# Patient Record
Sex: Male | Born: 1950
Health system: Southern US, Community
[De-identification: ages and names within clinical notes are randomized; demographics above are authoritative.]

## PROBLEM LIST (undated history)

## (undated) DIAGNOSIS — K9189 Other postprocedural complications and disorders of digestive system: Secondary | ICD-10-CM

## (undated) DIAGNOSIS — E119 Type 2 diabetes mellitus without complications: Secondary | ICD-10-CM

## (undated) DIAGNOSIS — K838 Other specified diseases of biliary tract: Secondary | ICD-10-CM

## (undated) DIAGNOSIS — N200 Calculus of kidney: Secondary | ICD-10-CM

## (undated) DIAGNOSIS — K819 Cholecystitis, unspecified: Secondary | ICD-10-CM

## (undated) DIAGNOSIS — K8000 Calculus of gallbladder with acute cholecystitis without obstruction: Secondary | ICD-10-CM

## (undated) DIAGNOSIS — I10 Essential (primary) hypertension: Secondary | ICD-10-CM

## (undated) HISTORY — PX: WISDOM TOOTH EXTRACTION: SHX21

## (undated) HISTORY — DX: Essential (primary) hypertension: I10

## (undated) HISTORY — DX: Type 2 diabetes mellitus without complications: E11.9

---

## 2011-06-21 ENCOUNTER — Other Ambulatory Visit: Payer: Self-pay | Admitting: Family Medicine

## 2012-01-30 ENCOUNTER — Ambulatory Visit (INDEPENDENT_AMBULATORY_CARE_PROVIDER_SITE_OTHER): Payer: BC Managed Care – PPO | Admitting: Family Medicine

## 2012-01-30 DIAGNOSIS — E785 Hyperlipidemia, unspecified: Secondary | ICD-10-CM | POA: Insufficient documentation

## 2012-01-30 DIAGNOSIS — L989 Disorder of the skin and subcutaneous tissue, unspecified: Secondary | ICD-10-CM

## 2012-01-30 DIAGNOSIS — D649 Anemia, unspecified: Secondary | ICD-10-CM

## 2012-01-30 DIAGNOSIS — I10 Essential (primary) hypertension: Secondary | ICD-10-CM

## 2012-01-30 DIAGNOSIS — E119 Type 2 diabetes mellitus without complications: Secondary | ICD-10-CM | POA: Insufficient documentation

## 2012-01-30 LAB — LIPID PANEL
Cholesterol: 170 mg/dL (ref 0–200)
HDL: 44 mg/dL (ref >39)
LDL Cholesterol: 113 mg/dL — ABNORMAL HIGH (ref 0–99)
Total CHOL/HDL Ratio: 3.9 Ratio
Triglycerides: 67 mg/dL (ref <150)
VLDL: 13 mg/dL (ref 0–40)

## 2012-01-30 LAB — COMPREHENSIVE METABOLIC PANEL
ALT: 17 U/L (ref 0–53)
AST: 16 U/L (ref 0–37)
Albumin: 4.8 g/dL (ref 3.5–5.2)
Alkaline Phosphatase: 55 U/L (ref 39–117)
BUN: 14 mg/dL (ref 6–23)
CO2: 28 mEq/L (ref 19–32)
Calcium: 9.4 mg/dL (ref 8.4–10.5)
Chloride: 107 mEq/L (ref 96–112)
Creat: 0.88 mg/dL (ref 0.50–1.35)
Glucose, Bld: 146 mg/dL — ABNORMAL HIGH (ref 70–99)
Potassium: 4.4 mEq/L (ref 3.5–5.3)
Sodium: 140 mEq/L (ref 135–145)
Total Bilirubin: 0.7 mg/dL (ref 0.3–1.2)
Total Protein: 6.9 g/dL (ref 6.0–8.3)

## 2012-01-30 LAB — POCT CBC
Granulocyte percent: 71.2 %G (ref 37–80)
HCT, POC: 36.2 % — AB (ref 43.5–53.7)
Hemoglobin: 12.3 g/dL — AB (ref 14.1–18.1)
Lymph, poc: 1.1 (ref 0.6–3.4)
MCH, POC: 32.9 pg — AB (ref 27–31.2)
MCHC: 34 g/dL (ref 31.8–35.4)
MCV: 96.7 fL (ref 80–97)
MID (cbc): 0.4 (ref 0–0.9)
MPV: 8.1 fL (ref 0–99.8)
POC Granulocyte: 3.8 (ref 2–6.9)
POC LYMPH PERCENT: 20.5 %L (ref 10–50)
POC MID %: 8.3 %M (ref 0–12)
Platelet Count, POC: 216 10*3/uL (ref 142–424)
RBC: 3.74 M/uL — AB (ref 4.69–6.13)
RDW, POC: 12.7 %
WBC: 5.4 10*3/uL (ref 4.6–10.2)

## 2012-01-30 LAB — POCT GLYCOSYLATED HEMOGLOBIN (HGB A1C): Hemoglobin A1C: 5.6

## 2012-01-30 MED ORDER — LISINOPRIL 20 MG PO TABS: 20.0000 mg | ORAL_TABLET | Freq: Every day | ORAL | Status: DC

## 2012-01-30 MED ORDER — TERBINAFINE HCL 250 MG PO TABS: 250.0000 mg | ORAL_TABLET | Freq: Every day | ORAL | Status: AC

## 2012-01-30 MED ORDER — PRAVASTATIN SODIUM 40 MG PO TABS: 40.0000 mg | ORAL_TABLET | Freq: Every day | ORAL | Status: DC

## 2012-01-30 MED ORDER — METFORMIN HCL 500 MG PO TABS: 500.0000 mg | ORAL_TABLET | Freq: Two times a day (BID) | ORAL | Status: DC

## 2012-01-30 NOTE — Progress Notes (Signed)
Patient Name: Chad Cox Date of Birth: 1950-11-12 Medical Record Number: [id number] Gender: male Date of Encounter: 01/30/2012  History of Present Illness:  Chad Cox is a 61 y.o. very pleasant male patient who presents with the following:  Here today to recheck his DM and HTN.  Is not exercising much, but otherwise feels that he is doing ok.  He does have toenail fungus- mostly of his bilateral great toes which is bothersome.  He has tried OTC topicals which have not helped He also notes a "pain at the end of my tailbone" which is not worse with having a BM.  This has been present for about 3 weeks, not getting worse- seems to be better at this time. Also notes that he has become the POA for an ill relative which is stressful for him.  He is taking some benadryl to help him sleep at night  Patient Active Problem List  Diagnoses  . Diabetes mellitus  . Hypertension  . Dyslipidemia   No past medical history on file. No past surgical history on file. History  Substance Use Topics  . Smoking status: Never Smoker   . Smokeless tobacco: Not on file  . Alcohol Use: Not on file   No family history on file. Allergies  Allergen Reactions  . Adhesive (Tape)   . Betadine (Povidone Iodine)     Medication list has been reviewed and updated.  Review of Systems: As per HPI- otherwise negative.  Physical Examination: Filed Vitals:   01/30/12 0808  BP: 121/75  Pulse: 71  Temp: 98.7 F (37.1 C)  TempSrc: Oral  Resp: 16  Height: 5\' 11"  (1.803 m)  Weight: 223 lb (101.152 kg)    Body mass index is 31.10 kg/(m^2).  GEN: WDWN, NAD, Non-toxic, A & O x 3, overweight HEENT: Atraumatic, Normocephalic. Neck supple. No masses, No LAD. Ears and Nose: No external deformity. CV: RRR, No M/G/R. No JVD. No thrill. No extra heart sounds. PULM: CTA B, no wheezes, crackles, rhonchi. No retractions. No resp. distress. No accessory muscle use. ABD: S, NT, ND, +BS. No rebound. No  HSM. EXTR: No c/c/e NEURO Normal gait.  PSYCH: Normally interactive. Conversant. Not depressed or anxious appearing.  Calm demeanor.  Foot exam:  Thickened toenails, especially bilateral great toes but otherwise normal Unable to elicit pain by palpating tailbone- no masses or fluctuance noted He points out a dark lesion which has appeared on his right cheek in the last few months.    Results for orders placed in visit on 01/30/12  POCT CBC      Component Value Range   WBC 5.4  4.6 - 10.2 (K/uL)   Lymph, poc 1.1  0.6 - 3.4    POC LYMPH PERCENT 20.5  10 - 50 (%L)   MID (cbc) 0.4  0 - 0.9    POC MID % 8.3  0 - 12 (%M)   POC Granulocyte 3.8  2 - 6.9    Granulocyte percent 71.2  37 - 80 (%G)   RBC 3.74 (*) 4.69 - 6.13 (M/uL)   Hemoglobin 12.3 (*) 14.1 - 18.1 (g/dL)   HCT, POC 40.9 (*) 81.1 - 53.7 (%)   MCV 96.7  80 - 97 (fL)   MCH, POC 32.9 (*) 27 - 31.2 (pg)   MCHC 34.0  31.8 - 35.4 (g/dL)   RDW, POC 91.4     Platelet Count, POC 216  142 - 424 (K/uL)   MPV 8.1  0 - 99.8 (  fL)  POCT GLYCOSYLATED HEMOGLOBIN (HGB A1C)      Component Value Range   Hemoglobin A1C 5.6     Assessment and Plan: 1. Diabetes mellitus  Comprehensive metabolic panel, POCT glycosylated hemoglobin (Hb A1C), Microalbumin, urine, metFORMIN (GLUCOPHAGE) 500 MG tablet  2. Hypertension  POCT CBC, lisinopril (PRINIVIL,ZESTRIL) 20 MG tablet  3. Dyslipidemia  Lipid panel, pravastatin (PRAVACHOL) 40 MG tablet  4. Toenail fungus  terbinafine (LAMISIL) 250 MG tablet  5. Skin lesion  Ambulatory referral to Dermatology  6. Anemia  Ambulatory referral to Gastroenterology   Await labs for further follow-up.   DM and BP well- controlled.  Encouraged more exercise.   Mylz never did get a colonoscopy as was recommended to him and I am concerned about his anemia.  Will make a referral to GI for him.  Also refer to dermatology to evaluate lesion on his cheek.  Rx lamisil for 12 weeks today for toenail fungus- he will await LFT  results prior to filling rx.

## 2012-01-31 LAB — MICROALBUMIN, URINE: Microalb, Ur: 1.51 mg/dL (ref 0.00–1.89)

## 2012-05-05 ENCOUNTER — Other Ambulatory Visit: Payer: Self-pay | Admitting: Family Medicine

## 2012-07-08 ENCOUNTER — Telehealth: Payer: Self-pay

## 2012-07-08 DIAGNOSIS — I10 Essential (primary) hypertension: Secondary | ICD-10-CM

## 2012-07-08 NOTE — Telephone Encounter (Signed)
Pt needs renewal on Lisinopril 20 mg to [facility] [address] and [place]

## 2012-07-08 NOTE — Telephone Encounter (Signed)
Pt is needing to get a refill on blood pressure medication  Best number [phone] or cell [phone]  [facility] [address] and [address]

## 2012-07-09 MED ORDER — LISINOPRIL 20 MG PO TABS: 20.0000 mg | ORAL_TABLET | Freq: Every day | ORAL | Status: DC

## 2012-07-09 NOTE — Telephone Encounter (Signed)
Done. But pt needs an OV.

## 2012-07-09 NOTE — Telephone Encounter (Signed)
Patient notified

## 2012-08-07 ENCOUNTER — Ambulatory Visit (INDEPENDENT_AMBULATORY_CARE_PROVIDER_SITE_OTHER): Payer: BC Managed Care – PPO | Admitting: Family Medicine

## 2012-08-07 ENCOUNTER — Ambulatory Visit: Payer: BC Managed Care – PPO

## 2012-08-07 VITALS — BP 118/74 | HR 76 | Temp 98.3°F | Resp 16 | Ht 71.0 in | Wt 231.6 lb

## 2012-08-07 DIAGNOSIS — D649 Anemia, unspecified: Secondary | ICD-10-CM

## 2012-08-07 DIAGNOSIS — M533 Sacrococcygeal disorders, not elsewhere classified: Secondary | ICD-10-CM

## 2012-08-07 DIAGNOSIS — I10 Essential (primary) hypertension: Secondary | ICD-10-CM

## 2012-08-07 DIAGNOSIS — E279 Disorder of adrenal gland, unspecified: Secondary | ICD-10-CM

## 2012-08-07 DIAGNOSIS — E119 Type 2 diabetes mellitus without complications: Secondary | ICD-10-CM

## 2012-08-07 LAB — POCT GLYCOSYLATED HEMOGLOBIN (HGB A1C): Hemoglobin A1C: 5

## 2012-08-07 LAB — POCT CBC
Granulocyte percent: 74.2 %G (ref 37–80)
HCT, POC: 39.3 % — AB (ref 43.5–53.7)
Hemoglobin: 12.8 g/dL — AB (ref 14.1–18.1)
Lymph, poc: 1 (ref 0.6–3.4)
MCH, POC: 33.3 pg — AB (ref 27–31.2)
MCHC: 32.6 g/dL (ref 31.8–35.4)
MCV: 102.4 fL — AB (ref 80–97)
MID (cbc): 0.4 (ref 0–0.9)
MPV: 8.6 fL (ref 0–99.8)
POC Granulocyte: 3.9 (ref 2–6.9)
POC LYMPH PERCENT: 18.1 %L (ref 10–50)
POC MID %: 7.7 %M (ref 0–12)
Platelet Count, POC: 210 10*3/uL (ref 142–424)
RBC: 3.84 M/uL — AB (ref 4.69–6.13)
RDW, POC: 12.5 %
WBC: 5.3 10*3/uL (ref 4.6–10.2)

## 2012-08-07 MED ORDER — LISINOPRIL 20 MG PO TABS: 20.0000 mg | ORAL_TABLET | Freq: Every day | ORAL | Status: DC

## 2012-08-07 NOTE — Patient Instructions (Addendum)
Your A1c looks great today, as does your blood pressure  Think about getting your flu shot!  This also helps to protect others around you who may be weaker than yourself.    Your mild anemia is stable.  However, you may be low on B12 or folate.  Add a multivitamin to your regimen.  Take care and let's check back in the spring

## 2012-08-07 NOTE — Progress Notes (Signed)
[facility] [address], [place] [address] [phone]  Date:  08/07/2012   Name:  Chad Cox   DOB:  July 23, 1951   MRN:  [id number]  PCP:  Abbe Amsterdam, MD    Chief Complaint: Medication Refill   History of Present Illness:  Chad Cox is a 61 y.o. very pleasant male patient who presents with the following:  Here today for a recheck. He feels that he is doing ok.  His toenails never did clear up from the fungus, but he is not bothered except by their appearance.   He did get a colonoscopy- he was told that everything looked fine.  He was told to follow-up in 10 years.   Discussed flu shot- he declines to have this done this year.   He does need refills of some of his medications.  He also continues to have pain in his tailbone, mostly just when he sits on a hard surface.  He does not have pain all of the time.  His has not noted any changes in his bowel habits.    Patient Active Problem List  Diagnosis  . Diabetes mellitus  . Hypertension  . Dyslipidemia    No past medical history on file.  No past surgical history on file.  History  Substance Use Topics  . Smoking status: Never Smoker   . Smokeless tobacco: Not on file  . Alcohol Use: Not on file    No family history on file.  Allergies  Allergen Reactions  . Adhesive (Tape)   . Betadine (Povidone Iodine)     Medication list has been reviewed and updated.  Current Outpatient Prescriptions on File Prior to Visit  Medication Sig Dispense Refill  . aspirin 81 MG tablet Take 81 mg by mouth daily.      Marland Kitchen lisinopril (PRINIVIL,ZESTRIL) 20 MG tablet Take 1 tablet (20 mg total) by mouth daily.  30 tablet  0  . metFORMIN (GLUCOPHAGE) 500 MG tablet Take 1 tablet (500 mg total) by mouth 2 (two) times daily with a meal.  90 tablet  3  . pravastatin (PRAVACHOL) 40 MG tablet Take 1 tablet (40 mg total) by mouth daily.  90 tablet  3  . terbinafine (LAMISIL) 250 MG tablet Take 1 tablet  (250 mg total) by mouth daily. Can rx #30 with 2 refills if required  120 tablet  0    Review of Systems:  As per HPI- otherwise negative.   Physical Examination: Filed Vitals:   08/07/12 0803  BP: 118/74  Pulse: 76  Temp: 98.3 F (36.8 C)  Resp: 16   Filed Vitals:   08/07/12 0803  Height: 5\' 11"  (1.803 m)  Weight: 231 lb 9.6 oz (105.053 kg)   Body mass index is 32.30 kg/(m^2). Ideal Body Weight: Weight in (lb) to have BMI = 25: 178.9   GEN: WDWN, NAD, Non-toxic, A & O x 3 HEENT: Atraumatic, Normocephalic. Neck supple. No masses, No LAD. Ears and Nose: No external deformity. CV: RRR, No M/G/R. No JVD. No thrill. No extra heart sounds. PULM: CTA B, no wheezes, crackles, rhonchi. No retractions. No resp. distress. No accessory muscle use. ABD: S, NT, ND, +BS. No rebound. No HSM. EXTR: No c/c/e NEURO Normal gait.  PSYCH: Normally interactive. Conversant. Not depressed or anxious appearing.  Calm demeanor.  GU: normal rectal exam, prostate normal and non- enlarged, non tender.  Coccyx is not tender to my exam Results for orders placed in visit on  08/07/12  POCT CBC      Component Value Range   WBC 5.3  4.6 - 10.2 K/uL   Lymph, poc 1.0  0.6 - 3.4   POC LYMPH PERCENT 18.1  10 - 50 %L   MID (cbc) 0.4  0 - 0.9   POC MID % 7.7  0 - 12 %M   POC Granulocyte 3.9  2 - 6.9   Granulocyte percent 74.2  37 - 80 %G   RBC 3.84 (*) 4.69 - 6.13 M/uL   Hemoglobin 12.8 (*) 14.1 - 18.1 g/dL   HCT, POC 16.1 (*) 09.6 - 53.7 %   MCV 102.4 (*) 80 - 97 fL   MCH, POC 33.3 (*) 27 - 31.2 pg   MCHC 32.6  31.8 - 35.4 g/dL   RDW, POC 04.5     Platelet Count, POC 210  142 - 424 K/uL   MPV 8.6  0 - 99.8 fL  POCT GLYCOSYLATED HEMOGLOBIN (HGB A1C)      Component Value Range   Hemoglobin A1C 5.0     [facility] reading (PRIMARY) by  Dr. Patsy Lager.  Sacrum/ coccyx: normal SACRUM AND COCCYX - 2+ VIEW  Comparison: None  Findings: Degenerative disc disease noted within the L5 S1 level.  No fracture or  subluxation identified.  No radio-opaque foreign bodies or soft tissue calcifications.  IMPRESSION:  1. No acute findings. 2. L5-S1 DJD.  Assessment and Plan: 1. Diabetes mellitus type II  POCT glycosylated hemoglobin (Hb A1C)  2. HTN (hypertension)    3. Coccyx pain  DG Sacrum/Coccyx  4. Anemia  POCT CBC  5. Hypertension  lisinopril (PRINIVIL,ZESTRIL) 20 MG tablet   Chad Cox's DM is very well controlled.  He is actually just taking his metformin once a day and we can continue this.  Refilled his lisinopril.  Reviewed his labs from March- it seems he never heard back from [place] regarding these labs.  Asked him to be sure to let me know if he does not get his labs in the future, because I always want him to have his lab information.  Gave some information about coxodynia, and he will think about if he wants to have any further therapy such as injections.    HTN is controlled.  Anemia is stable- we know he does not have colon cancer, so this is less concerning.    Abbe Amsterdam, MD

## 2012-08-10 ENCOUNTER — Encounter: Payer: Self-pay | Admitting: Family Medicine

## 2012-08-12 ENCOUNTER — Other Ambulatory Visit: Payer: Self-pay | Admitting: Family Medicine

## 2012-08-12 ENCOUNTER — Encounter: Payer: Self-pay | Admitting: Family Medicine

## 2012-08-12 DIAGNOSIS — Z125 Encounter for screening for malignant neoplasm of prostate: Secondary | ICD-10-CM

## 2012-08-14 ENCOUNTER — Encounter: Payer: Self-pay | Admitting: Family Medicine

## 2012-12-23 ENCOUNTER — Other Ambulatory Visit: Payer: Self-pay

## 2013-02-06 ENCOUNTER — Other Ambulatory Visit: Payer: Self-pay | Admitting: Family Medicine

## 2013-04-19 ENCOUNTER — Telehealth: Payer: Self-pay

## 2013-04-19 MED ORDER — METFORMIN HCL 500 MG PO TABS: 500.0000 mg | ORAL_TABLET | Freq: Two times a day (BID) | ORAL | Status: DC

## 2013-04-19 NOTE — Telephone Encounter (Signed)
lmom advising pt that he needs office visit.  Only 30 sent in.

## 2013-04-19 NOTE — Telephone Encounter (Signed)
Pt would like to have a refill on metformin. Best# [phone]  Pharmacy:[facility] [address] and [address]

## 2013-04-21 ENCOUNTER — Other Ambulatory Visit: Payer: Self-pay | Admitting: Family Medicine

## 2013-04-22 ENCOUNTER — Other Ambulatory Visit: Payer: Self-pay | Admitting: Family Medicine

## 2013-05-06 ENCOUNTER — Ambulatory Visit (INDEPENDENT_AMBULATORY_CARE_PROVIDER_SITE_OTHER): Payer: BC Managed Care – PPO | Admitting: Family Medicine

## 2013-05-06 VITALS — BP 128/72 | HR 70 | Temp 98.1°F | Resp 16 | Ht 71.0 in | Wt 231.0 lb

## 2013-05-06 DIAGNOSIS — Z125 Encounter for screening for malignant neoplasm of prostate: Secondary | ICD-10-CM

## 2013-05-06 DIAGNOSIS — E119 Type 2 diabetes mellitus without complications: Secondary | ICD-10-CM

## 2013-05-06 DIAGNOSIS — E785 Hyperlipidemia, unspecified: Secondary | ICD-10-CM

## 2013-05-06 DIAGNOSIS — I10 Essential (primary) hypertension: Secondary | ICD-10-CM

## 2013-05-06 MED ORDER — METFORMIN HCL 500 MG PO TABS: 500.0000 mg | ORAL_TABLET | Freq: Every day | ORAL | Status: DC

## 2013-05-06 MED ORDER — LISINOPRIL 20 MG PO TABS: 20.0000 mg | ORAL_TABLET | Freq: Every day | ORAL | Status: DC

## 2013-05-06 MED ORDER — PRAVASTATIN SODIUM 40 MG PO TABS: 40.0000 mg | ORAL_TABLET | Freq: Every day | ORAL | Status: DC

## 2013-05-06 NOTE — Patient Instructions (Signed)
Fasting labs this week, then follow up to be determined. You should receive a call or letter about your lab results within the next week to 10 days.  Continue same doses of medicines for now.

## 2013-05-06 NOTE — Progress Notes (Signed)
Subjective:    Patient ID: Chad Cox, male    DOB: November 03, 1951, 62 y.o.   MRN: [id number]  HPI Chad Cox is a 62 y.o. male  Here for medication refill. Last ov 08/07/12.  Usual pt of Dr. Patsy Lager.  Has had family stressors - uncle for whom he was power of attorney passed away in 10-Jan-2023, and mom had hospitalization with subdural bleed, then heart attack then in rehab, then CHF.  Difficult year - has had trouble with getting into office.    HTN - lisinopril 20mg  qd. No cough, no new side of effects of meds. No missed doses of meds recently.    DM2 - on metformin 500mg  qd. Rare missed doses.  No home blood sugars.   Hyperlipidemia - LDL 113 in March 2013.  Still taking pravachol QD. Not fasting today, but plans to come in sometime early this week for fasting labs.    Had order in for PSA but has not been able to have this done.  For prostate cancer screening.  No fh of prostate CA. No new urinary sx's.    Results for orders placed in visit on 08/07/12  POCT CBC      Result Value Range   WBC 5.3  4.6 - 10.2 K/uL   Lymph, poc 1.0  0.6 - 3.4   POC LYMPH PERCENT 18.1  10 - 50 %L   MID (cbc) 0.4  0 - 0.9   POC MID % 7.7  0 - 12 %M   POC Granulocyte 3.9  2 - 6.9   Granulocyte percent 74.2  37 - 80 %G   RBC 3.84 (*) 4.69 - 6.13 M/uL   Hemoglobin 12.8 (*) 14.1 - 18.1 g/dL   HCT, POC 62.9 (*) 52.8 - 53.7 %   MCV 102.4 (*) 80 - 97 fL   MCH, POC 33.3 (*) 27 - 31.2 pg   MCHC 32.6  31.8 - 35.4 g/dL   RDW, POC 41.3     Platelet Count, POC 210  142 - 424 K/uL   MPV 8.6  0 - 99.8 fL  POCT GLYCOSYLATED HEMOGLOBIN (HGB A1C)      Result Value Range   Hemoglobin A1C 5.0      Review of Systems  Constitutional: Negative for fatigue and unexpected weight change.  Eyes: Negative for visual disturbance.  Respiratory: Negative for cough, chest tightness and shortness of breath.   Cardiovascular: Negative for chest pain, palpitations and leg swelling.  Gastrointestinal: Negative for  abdominal pain and blood in stool.  Neurological: Negative for dizziness, light-headedness and headaches.       Objective:   Physical Exam  Vitals reviewed. Constitutional: He is oriented to person, place, and time. He appears well-developed and well-nourished.  HENT:  Head: Normocephalic and atraumatic.  Eyes: Pupils are equal, round, and reactive to light.  Cardiovascular: Normal rate, regular rhythm, normal heart sounds and intact distal pulses.   Pulmonary/Chest: Effort normal and breath sounds normal.  Abdominal: Soft. There is no tenderness.  Neurological: He is alert and oriented to person, place, and time.  Microfilament testing of feet normal bilaterally.  Skin: Skin is warm, dry and intact. No rash noted.  Psychiatric: He has a normal mood and affect. His behavior is normal.       Assessment & Plan:  Chad Cox is a 62 y.o. male Diabetes - Plan: pravastatin (PRAVACHOL) 40 MG tablet, metFORMIN (GLUCOPHAGE) 500 MG tablet, Hemoglobin A1c - prior controlled.  Possible diet control,  but continue metformin 500mg  qd for now - labs this week.   Hypertension - Plan: lisinopril (PRINIVIL,ZESTRIL) 20 MG tablet, Comprehensive metabolic panel, Lipid panel.  - stable. meds refilled. Fasting labs at lab visit only this week.  Prostate cancer screening - PSA pending on labs this week.   Other and unspecified hyperlipidemia - Plan: pravastatin (PRAVACHOL) 40 MG tablet refilled. , Lipid panel pending.   Meds ordered this encounter  Medications  . pravastatin (PRAVACHOL) 40 MG tablet    Sig: Take 1 tablet (40 mg total) by mouth daily. Needs office visit    Dispense:  90 tablet    Refill:  1  . metFORMIN (GLUCOPHAGE) 500 MG tablet    Sig: Take 1 tablet (500 mg total) by mouth daily with breakfast. Needs office visit    Dispense:  90 tablet    Refill:  1  . lisinopril (PRINIVIL,ZESTRIL) 20 MG tablet    Sig: Take 1 tablet (20 mg total) by mouth daily.    Dispense:  90 tablet     Refill:  1   Patient Instructions  Fasting labs this week, then follow up to be determined. You should receive a call or letter about your lab results within the next week to 10 days.  Continue same doses of medicines for now.

## 2013-05-08 ENCOUNTER — Other Ambulatory Visit (INDEPENDENT_AMBULATORY_CARE_PROVIDER_SITE_OTHER): Payer: BC Managed Care – PPO | Admitting: *Deleted

## 2013-05-08 DIAGNOSIS — Z125 Encounter for screening for malignant neoplasm of prostate: Secondary | ICD-10-CM

## 2013-05-08 DIAGNOSIS — E785 Hyperlipidemia, unspecified: Secondary | ICD-10-CM

## 2013-05-08 DIAGNOSIS — E119 Type 2 diabetes mellitus without complications: Secondary | ICD-10-CM

## 2013-05-08 DIAGNOSIS — I10 Essential (primary) hypertension: Secondary | ICD-10-CM

## 2013-05-08 LAB — LIPID PANEL
Cholesterol: 140 mg/dL (ref 0–200)
HDL: 38 mg/dL — ABNORMAL LOW (ref >39)
LDL Cholesterol: 86 mg/dL (ref 0–99)
Total CHOL/HDL Ratio: 3.7 Ratio
Triglycerides: 78 mg/dL (ref <150)
VLDL: 16 mg/dL (ref 0–40)

## 2013-05-08 LAB — COMPREHENSIVE METABOLIC PANEL
ALT: 19 U/L (ref 0–53)
AST: 23 U/L (ref 0–37)
Albumin: 4.6 g/dL (ref 3.5–5.2)
Alkaline Phosphatase: 56 U/L (ref 39–117)
BUN: 14 mg/dL (ref 6–23)
CO2: 26 mEq/L (ref 19–32)
Calcium: 9 mg/dL (ref 8.4–10.5)
Chloride: 106 mEq/L (ref 96–112)
Creat: 0.92 mg/dL (ref 0.50–1.35)
Glucose, Bld: 138 mg/dL — ABNORMAL HIGH (ref 70–99)
Potassium: 4.2 mEq/L (ref 3.5–5.3)
Sodium: 139 mEq/L (ref 135–145)
Total Bilirubin: 0.6 mg/dL (ref 0.3–1.2)
Total Protein: 6.7 g/dL (ref 6.0–8.3)

## 2013-05-08 LAB — PSA: PSA: 1.44 ng/mL (ref <=4.00)

## 2013-05-08 LAB — HEMOGLOBIN A1C
Hgb A1c MFr Bld: 5.5 % (ref <5.7)
Mean Plasma Glucose: 111 mg/dL (ref <117)

## 2013-05-09 ENCOUNTER — Encounter: Payer: Self-pay | Admitting: Family Medicine

## 2013-05-20 ENCOUNTER — Other Ambulatory Visit: Payer: Self-pay | Admitting: Physician Assistant

## 2013-05-21 ENCOUNTER — Encounter: Payer: Self-pay | Admitting: Family Medicine

## 2013-08-03 ENCOUNTER — Ambulatory Visit (INDEPENDENT_AMBULATORY_CARE_PROVIDER_SITE_OTHER): Payer: BC Managed Care – PPO | Admitting: Emergency Medicine

## 2013-08-03 VITALS — BP 112/70 | HR 80 | Temp 98.6°F | Resp 18 | Wt 235.0 lb

## 2013-08-03 DIAGNOSIS — E119 Type 2 diabetes mellitus without complications: Secondary | ICD-10-CM

## 2013-08-03 DIAGNOSIS — E860 Dehydration: Secondary | ICD-10-CM

## 2013-08-03 DIAGNOSIS — R112 Nausea with vomiting, unspecified: Secondary | ICD-10-CM

## 2013-08-03 LAB — POCT CBC
Granulocyte percent: 86.9 %G — AB (ref 37–80)
HCT, POC: 41.7 % — AB (ref 43.5–53.7)
Hemoglobin: 13.6 g/dL — AB (ref 14.1–18.1)
Lymph, poc: 0.5 — AB (ref 0.6–3.4)
MCH, POC: 33.5 pg — AB (ref 27–31.2)
MCHC: 32.6 g/dL (ref 31.8–35.4)
MCV: 102.7 fL — AB (ref 80–97)
MID (cbc): 0.3 (ref 0–0.9)
MPV: 8.5 fL (ref 0–99.8)
POC Granulocyte: 5.9 (ref 2–6.9)
POC LYMPH PERCENT: 8 %L — AB (ref 10–50)
POC MID %: 5.1 %M (ref 0–12)
Platelet Count, POC: 185 10*3/uL (ref 142–424)
RBC: 4.06 M/uL — AB (ref 4.69–6.13)
RDW, POC: 13.5 %
WBC: 6.8 10*3/uL (ref 4.6–10.2)

## 2013-08-03 LAB — GLUCOSE, POCT (MANUAL RESULT ENTRY): POC Glucose: 169 mg/dl — AB (ref 70–99)

## 2013-08-03 LAB — COMPREHENSIVE METABOLIC PANEL
ALT: 17 U/L (ref 0–53)
AST: 18 U/L (ref 0–37)
Albumin: 4.3 g/dL (ref 3.5–5.2)
Alkaline Phosphatase: 52 U/L (ref 39–117)
BUN: 16 mg/dL (ref 6–23)
CO2: 25 mEq/L (ref 19–32)
Calcium: 9.4 mg/dL (ref 8.4–10.5)
Chloride: 104 mEq/L (ref 96–112)
Creat: 0.89 mg/dL (ref 0.50–1.35)
Glucose, Bld: 172 mg/dL — ABNORMAL HIGH (ref 70–99)
Potassium: 3.8 mEq/L (ref 3.5–5.3)
Sodium: 138 mEq/L (ref 135–145)
Total Bilirubin: 0.9 mg/dL (ref 0.3–1.2)
Total Protein: 6.7 g/dL (ref 6.0–8.3)

## 2013-08-03 LAB — POCT URINALYSIS DIPSTICK
Glucose, UA: NEGATIVE
Ketones, UA: 15
Leukocytes, UA: NEGATIVE
Nitrite, UA: NEGATIVE
Protein, UA: 100
Spec Grav, UA: >=1.03
Urobilinogen, UA: 1
pH, UA: 5.5

## 2013-08-03 MED ORDER — ONDANSETRON 4 MG PO TBDP
8.0000 mg | ORAL_TABLET | Freq: Once | ORAL | Status: AC
  Administered 2013-08-03: 8 mg via ORAL

## 2013-08-03 MED ORDER — ONDANSETRON 8 MG PO TBDP: 8.0000 mg | ORAL_TABLET | Freq: Three times a day (TID) | ORAL | Status: DC | PRN

## 2013-08-03 NOTE — Progress Notes (Signed)
[facility] [address], [place] [address] [phone]  Date:  08/03/2013   Name:  Chad Cox   DOB:  October 09, 1951   MRN:  [id number]  PCP:  Abbe Amsterdam, MD    Chief Complaint: Emesis, Back Pain and Headache   History of Present Illness:  Chad Cox is a 62 y.o. very pleasant male patient who presents with the following:  History of NIDDM on metformin.  Says that over past three days has experienced nausea, vomiting, and diarrhea.  Unable to hold down liquids and only today managed some oatmeal.  No measured fever but had intermittent chills.  The patient has no complaint of blood, mucous, or pus in her stools.  No abdominal pain. Thirsty  Now has an occipital headache with no neuro or visual symptoms.  No improvement with over the counter medications or other home remedies. Denies other complaint or health concern today.   Patient Active Problem List   Diagnosis Date Noted  . Diabetes mellitus 01/30/2012  . Hypertension 01/30/2012  . Dyslipidemia 01/30/2012    Past Medical History  Diagnosis Date  . Diabetes mellitus without complication   . Hypertension     No past surgical history on file.  History  Substance Use Topics  . Smoking status: Never Smoker   . Smokeless tobacco: Not on file  . Alcohol Use: No    No family history on file.  Allergies  Allergen Reactions  . Adhesive [Tape]   . Betadine [Povidone Iodine]     Medication list has been reviewed and updated.  Current Outpatient Prescriptions on File Prior to Visit  Medication Sig Dispense Refill  . aspirin 81 MG tablet Take 81 mg by mouth daily.      Marland Kitchen lisinopril (PRINIVIL,ZESTRIL) 20 MG tablet Take 1 tablet (20 mg total) by mouth daily.  90 tablet  1  . metFORMIN (GLUCOPHAGE) 500 MG tablet Take 1 tablet (500 mg total) by mouth daily with breakfast. Needs office visit  90 tablet  1  . pravastatin (PRAVACHOL) 40 MG tablet Take 1 tablet (40 mg total) by mouth daily.  Needs office visit  90 tablet  1   No current facility-administered medications on file prior to visit.    Review of Systems:  As per HPI, otherwise negative.    Physical Examination: Filed Vitals:   08/03/13 1016  BP: 112/70  Pulse: 80  Temp: 98.6 F (37 C)  Resp: 18   Filed Vitals:   08/03/13 1016  Weight: 235 lb (106.595 kg)   Body mass index is 32.79 kg/(m^2). Ideal Body Weight:    GEN: WDWN, NAD, Non-toxic, A & O x 3 HEENT: Atraumatic, Normocephalic. Neck supple. No masses, No LAD.  dry Ears and Nose: No external deformity. CV: RRR, No M/G/R. No JVD. No thrill. No extra heart sounds. PULM: CTA B, no wheezes, crackles, rhonchi. No retractions. No resp. distress. No accessory muscle use. ABD: S, NT, ND, +BS. No rebound. No HSM. EXTR: No c/c/e NEURO Normal gait.  PSYCH: Normally interactive. Conversant. Not depressed or anxious appearing.  Calm demeanor.    Assessment and Plan: Dehydration Gastroenteritis Increase fluids zofran  Signed,  Phillips Odor, MD   Results for orders placed in visit on 08/03/13  POCT CBC      Result Value Range   WBC 6.8  4.6 - 10.2 K/uL   Lymph, poc 0.5 (*) 0.6 - 3.4   POC LYMPH PERCENT 8.0 (*) 10 - 50 %L  MID (cbc) 0.3  0 - 0.9   POC MID % 5.1  0 - 12 %M   POC Granulocyte 5.9  2 - 6.9   Granulocyte percent 86.9 (*) 37 - 80 %G   RBC 4.06 (*) 4.69 - 6.13 M/uL   Hemoglobin 13.6 (*) 14.1 - 18.1 g/dL   HCT, POC 16.1 (*) 09.6 - 53.7 %   MCV 102.7 (*) 80 - 97 fL   MCH, POC 33.5 (*) 27 - 31.2 pg   MCHC 32.6  31.8 - 35.4 g/dL   RDW, POC 04.5     Platelet Count, POC 185  142 - 424 K/uL   MPV 8.5  0 - 99.8 fL  GLUCOSE, POCT (MANUAL RESULT ENTRY)      Result Value Range   POC Glucose 169 (*) 70 - 99 mg/dl  POCT URINALYSIS DIPSTICK      Result Value Range   Color, UA dk yellow     Clarity, UA clear     Glucose, UA neg     Bilirubin, UA small     Ketones, UA 15     Spec Grav, UA >=1.030     Blood, UA trace     pH, UA  5.5     Protein, UA 100     Urobilinogen, UA 1.0     Nitrite, UA neg     Leukocytes, UA Negative

## 2013-08-03 NOTE — Patient Instructions (Addendum)
Viral Gastroenteritis Viral gastroenteritis is also known as stomach flu. This condition affects the stomach and intestinal tract. It can cause sudden diarrhea and vomiting. The illness typically lasts 3 to 8 days. Most people develop an immune response that eventually gets rid of the virus. While this natural response develops, the virus can make you quite ill. CAUSES  Many different viruses can cause gastroenteritis, such as rotavirus or noroviruses. You can catch one of these viruses by consuming contaminated food or water. You may also catch a virus by sharing utensils or other personal items with an infected person or by touching a contaminated surface. SYMPTOMS  The most common symptoms are diarrhea and vomiting. These problems can cause a severe loss of body fluids (dehydration) and a body salt (electrolyte) imbalance. Other symptoms may include:  Fever.  Headache.  Fatigue.  Abdominal pain. DIAGNOSIS  Your caregiver can usually diagnose viral gastroenteritis based on your symptoms and a physical exam. A stool sample may also be taken to test for the presence of viruses or other infections. TREATMENT  This illness typically goes away on its own. Treatments are aimed at rehydration. The most serious cases of viral gastroenteritis involve vomiting so severely that you are not able to keep fluids down. In these cases, fluids must be given through an intravenous line (IV). HOME CARE INSTRUCTIONS   Drink enough fluids to keep your urine clear or pale yellow. Drink small amounts of fluids frequently and increase the amounts as tolerated.  Ask your caregiver for specific rehydration instructions.  Avoid:  Foods high in sugar.  Alcohol.  Carbonated drinks.  Tobacco.  Juice.  Caffeine drinks.  Extremely hot or cold fluids.  Fatty, greasy foods.  Too much intake of anything at one time.  Dairy products until 24 to 48 hours after diarrhea stops.  You may consume probiotics.  Probiotics are active cultures of beneficial bacteria. They may lessen the amount and number of diarrheal stools in adults. Probiotics can be found in yogurt with active cultures and in supplements.  Wash your hands well to avoid spreading the virus.  Only take over-the-counter or prescription medicines for pain, discomfort, or fever as directed by your caregiver. Do not give aspirin to children. Antidiarrheal medicines are not recommended.  Ask your caregiver if you should continue to take your regular prescribed and over-the-counter medicines.  Keep all follow-up appointments as directed by your caregiver. SEEK IMMEDIATE MEDICAL CARE IF:   You are unable to keep fluids down.  You do not urinate at least once every 6 to 8 hours.  You develop shortness of breath.  You notice blood in your stool or vomit. This may look like coffee grounds.  You have abdominal pain that increases or is concentrated in one small area (localized).  You have persistent vomiting or diarrhea.  You have a fever.  The patient is a child younger than 3 months, and he or she has a fever.  The patient is a child older than 3 months, and he or she has a fever and persistent symptoms.  The patient is a child older than 3 months, and he or she has a fever and symptoms suddenly get worse.  The patient is a baby, and he or she has no tears when crying. MAKE SURE YOU:   Understand these instructions.  Will watch your condition.  Will get help right away if you are not doing well or get worse. Document Released: 10/25/2005 Document Revised: 01/17/2012 Document Reviewed: 08/11/2011   ExitCare Patient Information 2014 [place], [place]. Clear Liquid Diet The clear liquid dietconsists of foods that are liquid or will become liquid at room temperature.You should be able to see through the liquid and beverages. Examples of foods allowed on a clear liquid diet include fruit juice, broth or bouillon, gelatin, or frozen  ice pops. The purpose of this diet is to provide necessary fluid, electrolytes such as sodium and potassium, and energy to keep the body functioning during times when you are not able to consume a regular diet.A clear liquid diet should not be continued for long periods of time as it is not nutritionally adequate.  REASONS FOR USING A CLEAR LIQUID DIET  In sudden onset (acute) conditions for a patient before or after surgery.  As the first step in oral feeding.  For fluid and electrolyte replacement in diarrheal diseases.  As a diet before certain medical tests are performed. ADEQUACY The clear liquid diet is adequate only in ascorbic acid, according to the Recommended Dietary Allowances of the [employer]. CHOOSING FOODS Breads and Starches  Allowed:  None are allowed.  Avoid: All are avoided. Vegetables  Allowed:  Strained tomato or vegetable juice.  Avoid: Any others. Fruit  Allowed:  Strained fruit juices and fruit drinks. Include 1 serving of citrus or vitamin C-enriched fruit juice daily.  Avoid: Any others. Meat and Meat Substitutes  Allowed:  None are allowed.  Avoid: All are avoided. Milk  Allowed:  None are allowed.  Avoid: All are avoided. Soups and Combination Foods  Allowed:  Clear bouillon, broth, or strained broth-based soups.  Avoid: Any others. Desserts and Sweets  Allowed:  Sugar, honey. High protein gelatin. Flavored gelatin, ices, or frozen ice pops that do not contain milk.  Avoid: Any others. Fats and Oils  Allowed:  None are allowed.  Avoid: All are avoided. Beverages  Allowed: Cereal beverages, coffee (regular or decaffeinated), tea, or soda at the discretion of your caregiver.  Avoid: Any others. Condiments  Allowed:  Iodized salt.  Avoid: Any others, including pepper. Supplements  Allowed:  Liquid nutrition beverages.  Avoid: Any others that contain lactose or fiber. SAMPLE MEAL PLAN Breakfast  4 oz (120  mL) strained orange juice.   to 1 cup (125 to 250 mL) gelatin (plain or fortified).  1 cup (250 mL) beverage (coffee or tea).  Sugar, if desired. Midmorning Snack   cup (125 mL) gelatin (plain or fortified). Lunch  1 cup (250 mL) broth or consomm.  4 oz (120 mL) strained grapefruit juice.   cup (125 mL) gelatin (plain or fortified).  1 cup (250 mL) beverage (coffee or tea).  Sugar, if desired. Midafternoon Snack   cup (125 mL) fruit ice.   cup (125 mL) strained fruit juice. Dinner  1 cup (250 mL) broth or consomm.   cup (125 mL) cranberry juice.   cup (125 mL) flavored gelatin (plain or fortified).  1 cup (250 mL) beverage (coffee or tea).  Sugar, if desired. Evening Snack  4 oz (120 mL) strained apple juice (vitamin C-fortified).   cup (125 mL) flavored gelatin (plain or fortified). Document Released: 10/25/2005 Document Revised: 01/17/2012 Document Reviewed: 01/22/2011 [facility] Patient Information 2014 [place], [place].

## 2013-08-04 ENCOUNTER — Encounter: Payer: Self-pay | Admitting: Emergency Medicine

## 2013-09-13 ENCOUNTER — Other Ambulatory Visit: Payer: Self-pay

## 2013-11-13 ENCOUNTER — Encounter: Payer: Self-pay | Admitting: Family Medicine

## 2013-11-13 DIAGNOSIS — E119 Type 2 diabetes mellitus without complications: Secondary | ICD-10-CM

## 2013-11-13 DIAGNOSIS — I10 Essential (primary) hypertension: Secondary | ICD-10-CM

## 2013-11-13 DIAGNOSIS — E785 Hyperlipidemia, unspecified: Secondary | ICD-10-CM

## 2013-11-14 MED ORDER — LISINOPRIL 20 MG PO TABS: 20.0000 mg | ORAL_TABLET | Freq: Every day | ORAL | Status: DC

## 2013-11-14 MED ORDER — METFORMIN HCL 500 MG PO TABS: 500.0000 mg | ORAL_TABLET | Freq: Every day | ORAL | Status: DC

## 2013-11-21 MED ORDER — PRAVASTATIN SODIUM 40 MG PO TABS: 40.0000 mg | ORAL_TABLET | Freq: Every day | ORAL | Status: DC

## 2013-11-21 NOTE — Addendum Note (Signed)
Addended by: Abbe AmsterdamOPLAND, JESSICA C on: 11/21/2013 01:13 PM   Modules accepted: Orders

## 2014-04-29 ENCOUNTER — Encounter: Payer: Self-pay | Admitting: Family Medicine

## 2014-04-29 ENCOUNTER — Ambulatory Visit (INDEPENDENT_AMBULATORY_CARE_PROVIDER_SITE_OTHER): Payer: BC Managed Care – PPO | Admitting: Family Medicine

## 2014-04-29 VITALS — BP 122/70 | HR 75 | Temp 98.2°F | Resp 16 | Ht 70.5 in | Wt 232.0 lb

## 2014-04-29 DIAGNOSIS — E119 Type 2 diabetes mellitus without complications: Secondary | ICD-10-CM

## 2014-04-29 DIAGNOSIS — I10 Essential (primary) hypertension: Secondary | ICD-10-CM

## 2014-04-29 DIAGNOSIS — Z125 Encounter for screening for malignant neoplasm of prostate: Secondary | ICD-10-CM

## 2014-04-29 DIAGNOSIS — E139 Other specified diabetes mellitus without complications: Secondary | ICD-10-CM

## 2014-04-29 DIAGNOSIS — R972 Elevated prostate specific antigen [PSA]: Secondary | ICD-10-CM

## 2014-04-29 DIAGNOSIS — E785 Hyperlipidemia, unspecified: Secondary | ICD-10-CM

## 2014-04-29 DIAGNOSIS — E089 Diabetes mellitus due to underlying condition without complications: Secondary | ICD-10-CM

## 2014-04-29 DIAGNOSIS — E669 Obesity, unspecified: Secondary | ICD-10-CM

## 2014-04-29 LAB — BASIC METABOLIC PANEL
BUN: 22 mg/dL (ref 6–23)
CO2: 22 mEq/L (ref 19–32)
Calcium: 9.2 mg/dL (ref 8.4–10.5)
Chloride: 108 mEq/L (ref 96–112)
Creat: 0.93 mg/dL (ref 0.50–1.35)
Glucose, Bld: 145 mg/dL — ABNORMAL HIGH (ref 70–99)
Potassium: 4.5 mEq/L (ref 3.5–5.3)
Sodium: 142 mEq/L (ref 135–145)

## 2014-04-29 LAB — MICROALBUMIN, URINE: Microalb, Ur: 1.87 mg/dL (ref 0.00–1.89)

## 2014-04-29 LAB — POCT GLYCOSYLATED HEMOGLOBIN (HGB A1C): Hemoglobin A1C: 5.9

## 2014-04-29 LAB — GLUCOSE, POCT (MANUAL RESULT ENTRY): POC Glucose: 151 mg/dl — AB (ref 70–99)

## 2014-04-29 MED ORDER — METFORMIN HCL 500 MG PO TABS: 500.0000 mg | ORAL_TABLET | Freq: Every day | ORAL | Status: DC

## 2014-04-29 MED ORDER — PRAVASTATIN SODIUM 40 MG PO TABS: 40.0000 mg | ORAL_TABLET | Freq: Every day | ORAL | Status: DC

## 2014-04-29 MED ORDER — LISINOPRIL 20 MG PO TABS: 20.0000 mg | ORAL_TABLET | Freq: Every day | ORAL | Status: DC

## 2014-04-29 NOTE — Progress Notes (Signed)
[facility] [address], [place] [address] [phone]  Date:  04/29/2014   Name:  Chad Rainwaterimothy Scheeler   DOB:  11-Aug-1951   MRN:  [id number]  PCP:  Abbe AmsterdamOPLAND,JESSICA, MD    Chief Complaint: Follow-up and Medication Refill   History of Present Illness:  Chad Cox is a 63 y.o. very pleasant male patient who presents with the following:  Here today for a follow-up visit.  Most recent A1c was about a year ago, well controlled at 5.5%. He is doing well except his mother's health is declining.  Her health is declining and this is getting to be more of a burden.    Colonoscopy about 2 years ago He is fasting today.  He does not check his glucose at home.  He is just on oral metformin.   [facility]- he is a little overdue for this visit.  Has not yet has zostavax Patient Active Problem List   Diagnosis Date Noted  . Diabetes mellitus 01/30/2012  . Hypertension 01/30/2012  . Dyslipidemia 01/30/2012    Past Medical History  Diagnosis Date  . Diabetes mellitus without complication   . Hypertension     No past surgical history on file.  History  Substance Use Topics  . Smoking status: Never Smoker   . Smokeless tobacco: Not on file  . Alcohol Use: No    No family history on file.  Allergies  Allergen Reactions  . Adhesive [Tape]   . Betadine [Povidone Iodine]     Medication list has been reviewed and updated.  Current Outpatient Prescriptions on File Prior to Visit  Medication Sig Dispense Refill  . aspirin 81 MG tablet Take 81 mg by mouth daily.      Marland Kitchen. lisinopril (PRINIVIL,ZESTRIL) 20 MG tablet Take 1 tablet (20 mg total) by mouth daily.  90 tablet  1  . metFORMIN (GLUCOPHAGE) 500 MG tablet Take 1 tablet (500 mg total) by mouth daily with breakfast.  90 tablet  1  . pravastatin (PRAVACHOL) 40 MG tablet Take 1 tablet (40 mg total) by mouth daily.  90 tablet  1   No current facility-administered medications on file prior to visit.     Review of Systems:  As per HPI- otherwise negative.   Physical Examination: Filed Vitals:   04/29/14 0857  BP: 122/70  Pulse: 75  Temp: 98.2 F (36.8 C)  Resp: 16   Filed Vitals:   04/29/14 0857  Height: 5' 10.5" (1.791 m)  Weight: 232 lb (105.235 kg)   Body mass index is 32.81 kg/(m^2). Ideal Body Weight: Weight in (lb) to have BMI = 25: 176.4  GEN: WDWN, NAD, Non-toxic, A & O x 3, overweight, looks well HEENT: Atraumatic, Normocephalic. Neck supple. No masses, No LAD. Ears and Nose: No external deformity. CV: RRR, No M/G/R. No JVD. No thrill. No extra heart sounds. PULM: CTA B, no wheezes, crackles, rhonchi. No retractions. No resp. distress. No accessory muscle use. ABD: S, NT, ND. No rebound. No HSM. EXTR: No c/c/e NEURO Normal gait.  PSYCH: Normally interactive. Conversant. Not depressed or anxious appearing.  Calm demeanor.   Results for orders placed in visit on 04/29/14  GLUCOSE, POCT (MANUAL RESULT ENTRY)      Result Value Ref Range   POC Glucose 151 (*) 70 - 99 mg/dl  POCT GLYCOSYLATED HEMOGLOBIN (HGB A1C)      Result Value Ref Range   Hemoglobin A1C 5.9  Assessment and Plan: Type II or unspecified type diabetes mellitus without mention of complication, not stated as uncontrolled - Plan: HM Diabetes Foot Exam, POCT glucose (manual entry), Microalbumin, urine  Essential hypertension - Plan: lisinopril (PRINIVIL,ZESTRIL) 20 MG tablet  Hypertension - Plan: POCT CBC, Basic metabolic panel  Diabetes - Plan: POCT glycosylated hemoglobin (Hb A1C)  Obesity, unspecified  Diabetes mellitus due to underlying condition without complications - Plan: metFORMIN (GLUCOPHAGE) 500 MG tablet, pravastatin (PRAVACHOL) 40 MG tablet  Other and unspecified hyperlipidemia - Plan: pravastatin (PRAVACHOL) 40 MG tablet  Screening for prostate cancer - Plan: PSA  Here today to followup. Doing well with his DM.  Continue current medications.  His main issue right now  is that his mother is developing worsening dementia- she lives with him. They do have some in home assistance, but this is still a large burden on him.  He will let me know if he needs any help with placement.  Also, suggested an "assisted vacation" company that might be able to help him get away along with his mom.   See patient instructions for more details.     Signed Abbe AmsterdamJessica Copland, MD  He has refused pneumonia vaccine in the past- will ask him about this again with labs.

## 2014-04-29 NOTE — Patient Instructions (Addendum)
Give your eye doctor a call and arrange a visit when you can. I will be in touch with your labs Get your shingles (zostavax) vaccine at your convenience.   We did refills for a year today.   Best of luck in caring for your mother- I know this is a big challenge for you!    Check out [employer]- online- for caregiver services that can accompany you on a vacation

## 2014-04-30 LAB — PSA: PSA: 2.01 ng/mL (ref <=4.00)

## 2014-05-01 NOTE — Addendum Note (Signed)
Addended by: Abbe AmsterdamOPLAND, JESSICA C on: 05/01/2014 08:56 PM   Modules accepted: Orders

## 2014-05-09 ENCOUNTER — Encounter: Payer: Self-pay | Admitting: Family Medicine

## 2014-06-04 ENCOUNTER — Other Ambulatory Visit: Payer: Self-pay | Admitting: Family Medicine

## 2014-06-15 ENCOUNTER — Ambulatory Visit (INDEPENDENT_AMBULATORY_CARE_PROVIDER_SITE_OTHER): Payer: BC Managed Care – PPO | Admitting: Emergency Medicine

## 2014-06-15 VITALS — BP 118/62 | HR 75 | Temp 98.3°F | Resp 20 | Ht 71.0 in | Wt 232.0 lb

## 2014-06-15 DIAGNOSIS — L02419 Cutaneous abscess of limb, unspecified: Secondary | ICD-10-CM

## 2014-06-15 DIAGNOSIS — M79604 Pain in right leg: Secondary | ICD-10-CM

## 2014-06-15 DIAGNOSIS — M79609 Pain in unspecified limb: Secondary | ICD-10-CM

## 2014-06-15 DIAGNOSIS — L03119 Cellulitis of unspecified part of limb: Principal | ICD-10-CM

## 2014-06-15 MED ORDER — SULFAMETHOXAZOLE-TMP DS 800-160 MG PO TABS: 1.0000 | ORAL_TABLET | Freq: Two times a day (BID) | ORAL | Status: DC

## 2014-06-15 NOTE — Progress Notes (Signed)
Patient ID: Chad Cox MRN: [id number], DOB: 06-Jan-1951, 63 y.o. Date of Encounter: 06/15/2014, 3:18 PM    PROCEDURE NOTE: Verbal consent obtained. Betadine prep per usual protocol. Local anesthesia obtained with 1% lidocaine with epinephrine.   1 cm incision made with 11 blade along lesion.  Culture not collected per MD orders. Moderate amount of purulence expressed. Lesion explored revealing no loculations. Packed with 1/4 plain packing.  Dressed. Wound care instructions including precautions with patient. Patient tolerated the procedure well. Recheck in 48 hours     Signed, Chad Cox, [place] 06/15/2014 3:18 PM

## 2014-06-15 NOTE — Patient Instructions (Signed)

## 2014-06-15 NOTE — Progress Notes (Signed)
[facility] [address], [place] [address] [phone]  Date:  06/15/2014   Name:  Chad Cox   DOB:  1951/08/17   MRN:  [id number]  PCP:  Abbe AmsterdamOPLAND,JESSICA, MD    Chief Complaint: Recurrent Skin Infections   History of Present Illness:  Chad [name] is a 63 y.o. very pleasant male patient who presents with the following:  Has a week long history of a lesion on the anterior right thigh. No fever or chills.  Has attempted to drain abscess with no improvement.  No history of injury. No improvement with over the counter medications or other home remedies. . Denies other complaint or health concern today.   Patient Active Problem List   Diagnosis Date Noted  . Obesity, unspecified 04/29/2014  . Diabetes mellitus 01/30/2012  . Hypertension 01/30/2012  . Dyslipidemia 01/30/2012    Past Medical History  Diagnosis Date  . Diabetes mellitus without complication   . Hypertension     No past surgical history on file.  History  Substance Use Topics  . Smoking status: Never Smoker   . Smokeless tobacco: Never Used  . Alcohol Use: No    No family history on file.  Allergies  Allergen Reactions  . Adhesive [Tape]   . Betadine [Povidone Iodine]     Medication list has been reviewed and updated.  Current Outpatient Prescriptions on File Prior to Visit  Medication Sig Dispense Refill  . aspirin 81 MG tablet Take 81 mg by mouth daily.      Marland Kitchen. lisinopril (PRINIVIL,ZESTRIL) 20 MG tablet Take 1 tablet (20 mg total) by mouth daily.  90 tablet  3  . metFORMIN (GLUCOPHAGE) 500 MG tablet Take 1 tablet (500 mg total) by mouth daily with breakfast.  90 tablet  3  . pravastatin (PRAVACHOL) 40 MG tablet Take 1 tablet (40 mg total) by mouth daily.  90 tablet  3   No current facility-administered medications on file prior to visit.    Review of Systems:  As per HPI, otherwise negative.    Physical Examination: Filed Vitals:   06/15/14 1446  BP:  118/62  Pulse: 75  Temp: 98.3 F (36.8 C)  Resp: 20   Filed Vitals:   06/15/14 1446  Height: 5\' 11"  (1.803 m)  Weight: 232 lb (105.235 kg)   Body mass index is 32.37 kg/(m^2). Ideal Body Weight: Weight in (lb) to have BMI = 25: 178.9   GEN: WDWN, NAD, Non-toxic, Alert & Oriented x 3 HEENT: Atraumatic, Normocephalic.  Ears and Nose: No external deformity. EXTR: No clubbing/cyanosis/edema NEURO: Normal gait.  PSYCH: Normally interactive. Conversant. Not depressed or anxious appearing.  Calm demeanor.  RIGHT leg:  Abscess anterior mid thigh no lymphangitis  Assessment and Plan: Abscess right thigh Leg pain Septra I&D   Signed,  Phillips OdorJeffery Dashayla Theissen, MD

## 2014-06-17 ENCOUNTER — Ambulatory Visit (INDEPENDENT_AMBULATORY_CARE_PROVIDER_SITE_OTHER): Payer: BC Managed Care – PPO | Admitting: Physician Assistant

## 2014-06-17 DIAGNOSIS — L02419 Cutaneous abscess of limb, unspecified: Secondary | ICD-10-CM

## 2014-06-17 DIAGNOSIS — M79604 Pain in right leg: Secondary | ICD-10-CM

## 2014-06-17 DIAGNOSIS — M79609 Pain in unspecified limb: Secondary | ICD-10-CM

## 2014-06-17 DIAGNOSIS — L03119 Cellulitis of unspecified part of limb: Principal | ICD-10-CM

## 2014-06-17 NOTE — Progress Notes (Signed)
   Subjective:    Patient ID: Chad Cox, male    DOB: 03/16/1951, 63 y.o.   MRN: [id number]   PCP: Abbe AmsterdamOPLAND,JESSICA, MD  Chief Complaint  Patient presents with  . Wound care    Wound is located on pt's right thigh.     HPI  Presents for wound care.  Cellulitis/abscess on the RIGHT thigh was I&D'd 2 days ago.  Minimal improvement thus far.  No fever, chills. Tolerating antibiotic without difficulty. Dressing changes at home.   Review of Systems     Objective:   Physical Exam  There were no vitals taken for this visit. WDWNWM, A&O x 3. Dressing removed.  Moderate purulent drainage on the dressing.  A scant amount of packing removed, revealing a shallow wound bed covered with yellow eschar.  The surrounding skin is erythematous and indurated and palpation is very painful, but also expresses additional thick purulence from deep within the wound.  He was unable to tolerate debridement, so the area was anesthetized with 4 cc of 2% lidocaine plain.  Large amount of eschar was debrided using forceps allowing for expression of additional purulence.  The wound was then repacked with 1/4 inch packing and dressed.      Assessment & Plan:  1. Cellulitis and abscess of leg 2. Pain of right lower extremity Continue antibiotic.  Warm compresses. RTC tomorrow for re-evaluation.   Fernande Brashelle S. Chonita Gadea, PA-C Physician Assistant-Certified [facility]

## 2014-06-17 NOTE — Patient Instructions (Signed)
Continue the antibiotic as prescribed. Apply a warm compress to the area for 15-20 minutes 2-4 times each day. Change the dressing if it becomes saturated, leaks or falls off, or if it gets wet (like in the shower).

## 2014-06-18 ENCOUNTER — Ambulatory Visit (INDEPENDENT_AMBULATORY_CARE_PROVIDER_SITE_OTHER): Payer: BC Managed Care – PPO | Admitting: Physician Assistant

## 2014-06-18 VITALS — BP 123/80 | HR 56 | Temp 98.7°F | Ht 71.0 in | Wt 232.4 lb

## 2014-06-18 DIAGNOSIS — L03119 Cellulitis of unspecified part of limb: Principal | ICD-10-CM

## 2014-06-18 DIAGNOSIS — L02419 Cutaneous abscess of limb, unspecified: Secondary | ICD-10-CM

## 2014-06-18 NOTE — Patient Instructions (Signed)
Continue the antibiotic as prescribed. Apply a warm compress to the area for 15-20 minutes 2-4 times each day. Change the dressing if it becomes saturated, leaks or falls off.  

## 2014-06-18 NOTE — Progress Notes (Signed)
   Subjective:    Patient ID: Chad Cox, male    DOB: [birth year], 63 y.o.   MRN: [id number]   PCP: Abbe AmsterdamOPLAND,JESSICA, MD  Chief Complaint  Patient presents with  . Wound Care    HPI  Presents for wound care. I&D of cellulitis/abscess on 06/15/2014. When he presented for wound care yesterday, he had not had much in the way of improvement.  A large amount of thick yellow eschar had formed preventing drainage.  The area was re-anesthetized and debrided to bloody tissue, and then re-packed. Today he notes that he's had significant improvement in the discomfort, that the pain is gone when he sits, and only hurts for 15-20 seconds after rising to standing. He continues to have skin irritation from the adhesive dressings. No fever, chills, GI or GU symptoms.  Review of Systems     Objective:   Physical Exam  BP 123/80  Pulse 56  Temp(Src) 98.7 F (37.1 C) (Oral)  Ht 5\' 11"  (1.803 m)  Wt 232 lb 6 oz (105.405 kg)  BMI 32.42 kg/m2 WDWNWM, A&O x 3.  Dressing and packing removed.   The skin surrounding the wound is irritated, consistent with repeated application and removal of adhesive dressing.   The surrounding erythema has receded, and the induration as well, though both remain prominent.  The skin immediately surrounding the wound is deep red and thinned.  There is new yellow eschar extending from the wound edge 3-4 mm into the wound, but the base of the wound is generally clear.   There is no additional purulence expressed.  The wound cavity was irrigated and the eschar removed with pick-ups as he could tolerate prior to repacking.   Dressing applied using paper tape to reduce irritation to his skin.      Assessment & Plan:  1. Cellulitis and abscess of leg Continue local wound care, antibiotic and dressing changes.  RTC tomorrow or the following day for wound care with Ms. Marte.   Fernande Brashelle S. Ellenie Salome, PA-C Physician Assistant-Certified [facility]

## 2014-06-19 ENCOUNTER — Ambulatory Visit (INDEPENDENT_AMBULATORY_CARE_PROVIDER_SITE_OTHER): Payer: BC Managed Care – PPO | Admitting: Physician Assistant

## 2014-06-19 VITALS — BP 118/68 | HR 60 | Temp 97.9°F | Resp 16 | Ht 70.75 in | Wt 230.4 lb

## 2014-06-19 DIAGNOSIS — L02419 Cutaneous abscess of limb, unspecified: Secondary | ICD-10-CM

## 2014-06-19 DIAGNOSIS — L03119 Cellulitis of unspecified part of limb: Secondary | ICD-10-CM

## 2014-06-19 NOTE — Progress Notes (Signed)
Patient ID: Chad Cox MRN: [id number], DOB: 1951-10-21 63 y.o. Date of Encounter: 06/19/2014, 7:38 PM  Chief Complaint: Wound care   See previous note  HPI: 63 y.o. y/o male presents for wound care s/p I&D on 06/15/14.  Doing well No issues or complaints Afebrile/ no chills No nausea or vomiting Tolerating Septra. Pain still present. Continues to have stinging pain especially with walking.  Motrin relieves pain.  Daily dressing change Previous note reviewed  Past Medical History  Diagnosis Date  . Diabetes mellitus without complication   . Hypertension      Home Meds: Prior to Admission medications   Medication Sig Start Date End Date Taking? Authorizing Provider  aspirin 81 MG tablet Take 81 mg by mouth daily.   Yes Historical Provider, MD  lisinopril (PRINIVIL,ZESTRIL) 20 MG tablet Take 1 tablet (20 mg total) by mouth daily. 04/29/14  Yes Gwenlyn FoundJessica C Copland, MD  metFORMIN (GLUCOPHAGE) 500 MG tablet Take 1 tablet (500 mg total) by mouth daily with breakfast. 04/29/14  Yes Gwenlyn FoundJessica C Copland, MD  pravastatin (PRAVACHOL) 40 MG tablet Take 1 tablet (40 mg total) by mouth daily. 04/29/14  Yes Gwenlyn FoundJessica C Copland, MD  sulfamethoxazole-trimethoprim (BACTRIM DS) 800-160 MG per tablet Take 1 tablet by mouth 2 (two) times daily. 06/15/14  Yes Phillips OdorJeffery Anderson, MD    Allergies:  Allergies  Allergen Reactions  . Adhesive [Tape]   . Betadine [Povidone Iodine]     ROS: Constitutional: Afebrile, no chills Cardiovascular: negative for chest pain or palpitations Dermatological: Positive for wound. Negative for erythema, pain, or warmth  GI: No nausea or vomiting   EXAM: Physical Exam: Blood pressure 118/68, pulse 60, temperature 97.9 F (36.6 C), resp. rate 16, height 5' 10.75" (1.797 m), weight 230 lb 6.4 oz (104.509 kg), SpO2 95.00%., Body mass index is 32.36 kg/(m^2). General: Well developed, well nourished, in no acute distress. Nontoxic appearing. Head: Normocephalic, atraumatic,  sclera non-icteric.  Neck: Supple. Lungs: Breathing is unlabored. Heart: Normal rate. Skin:  Warm and moist. Dressing and packing in place. No induration, surrounding erythema, or warmth. +TPP. There is a large wound with beefy red base.  Small amount of eschar around edges.  Neuro: Alert and oriented X 3. Moves all extremities spontaneously. Normal gait.  Psych:  Responds to questions appropriately with a normal affect.       PROCEDURE: Dressing and packing removed. No purulence expressed.  Wound edges debrided of small amount of eschar.  Wound bed healthy Irrigated with 1% plain lidocaine 5 cc. Repacked with 1/4 packing.  Dressing applied  LAB: Culture: not done  A/P: 63 y.o. y/o male with thigh cellulitis/abscess as above s/p I&D on 06/15/14 Wound care per above Continue Bactrim Pain well controlled Daily dressing changes Recheck 48-72 hours  Signed, Rhoderick MoodyHeather Kein Carlberg, PA-C 06/19/2014 7:38 PM

## 2014-06-22 ENCOUNTER — Ambulatory Visit (INDEPENDENT_AMBULATORY_CARE_PROVIDER_SITE_OTHER): Payer: BC Managed Care – PPO | Admitting: Emergency Medicine

## 2014-06-22 VITALS — BP 112/64 | HR 72 | Temp 97.8°F | Resp 16 | Ht 70.75 in | Wt 230.0 lb

## 2014-06-22 DIAGNOSIS — S81801S Unspecified open wound, right lower leg, sequela: Secondary | ICD-10-CM

## 2014-06-22 DIAGNOSIS — IMO0002 Reserved for concepts with insufficient information to code with codable children: Secondary | ICD-10-CM

## 2014-06-22 NOTE — Progress Notes (Signed)
   Subjective:   This chart was scribed for Viviann SpareSteven A. Cleta Albertsaub, MD by Arlan OrganAshley Leger, [facility] Scribe. This patient was seen in room 1 and the patient's care was started 3:15 PM.    Patient ID: Chad Cox, male    DOB: 11/03/1951, 63 y.o.   MRN: [id number]  HPI  HPI Comments: Chad Cox is a 63 y.o. male with a PMHx of HTN and DM who [facility] here for wound check today. Pt was diagnosed with cellulitis/abscess to the R anterior thigh 8/8. I&D was performed at time of visit. States he is doing well and denies any ongoing pain to the wound. No issues or complaints this visit. He denies any fever, nausea, vomiting, or chills recently. Pt is tolerating Septra without difficulty. Pt with known allergies to adhesive tape and betadine. No other concerns this visit.   Patient Active Problem List   Diagnosis Date Noted  . Obesity, unspecified 04/29/2014  . Diabetes mellitus 01/30/2012  . Hypertension 01/30/2012  . Dyslipidemia 01/30/2012   Past Medical History  Diagnosis Date  . Diabetes mellitus without complication   . Hypertension    History reviewed. No pertinent past surgical history. Allergies  Allergen Reactions  . Adhesive [Tape]   . Betadine [Povidone Iodine]    Prior to Admission medications   Medication Sig Start Date End Date Taking? Authorizing Provider  aspirin 81 MG tablet Take 81 mg by mouth daily.   Yes Historical Provider, MD  lisinopril (PRINIVIL,ZESTRIL) 20 MG tablet Take 1 tablet (20 mg total) by mouth daily. 04/29/14  Yes Gwenlyn FoundJessica C Copland, MD  metFORMIN (GLUCOPHAGE) 500 MG tablet Take 1 tablet (500 mg total) by mouth daily with breakfast. 04/29/14  Yes Gwenlyn FoundJessica C Copland, MD  pravastatin (PRAVACHOL) 40 MG tablet Take 1 tablet (40 mg total) by mouth daily. 04/29/14  Yes Gwenlyn FoundJessica C Copland, MD  sulfamethoxazole-trimethoprim (BACTRIM DS) 800-160 MG per tablet Take 1 tablet by mouth 2 (two) times daily. 06/15/14  Yes  Phillips OdorJeffery Anderson, MD    Review of Systems  Constitutional: Negative for fever and chills.  Gastrointestinal: Negative for nausea and vomiting.  Skin: Positive for wound (Healing wound secondary to I&D to R lower extremity).    Triage Vitals: BP 112/64  Pulse 72  Temp(Src) 97.8 F (36.6 C) (Oral)  Resp 16  Ht 5' 10.75" (1.797 m)  Wt 230 lb (104.327 kg)  BMI 32.31 kg/m2  SpO2 98%   Objective:  Physical Exam  Nursing note and vitals reviewed. Constitutional: He is oriented to person, place, and time. He appears well-developed and well-nourished.  HENT:  Head: Normocephalic.  Eyes: EOM are normal.  Neck: Normal range of motion.  Pulmonary/Chest: Effort normal.  Abdominal: He exhibits no distension.  Musculoskeletal: Normal range of motion.  Neurological: He is alert and oriented to person, place, and time.  Skin:  2x2 cm open indurated I&D site to R anterior thigh Necrotic tissue on both sides No purulence expressed from wound  Psychiatric: He has a normal mood and affect.     Assessment & Plan:  There was a good amount of devitalized tissue around the infection site. This area was debridement. No change in medications. We'll go with wet-to-dry. I personally performed the services described in this documentation, which was scribed in my presence. The recorded information has been reviewed and is accurate.

## 2014-06-23 NOTE — Progress Notes (Signed)
Procedure:  Consent obtained.  Local anesthesia with 2% lido with epi.  1.5 cm deep wound with necrotic tissue at base was debrided with forceps and curette down to blood granulation tissue, no purulence from the wound. There was no erythema or induration around the wound edges.  Wet to dry drsg placed on the wound with coban due to skin sensitivity to the tape that had been previous used for his Drsgs. How to apply wet to dry drsg was d/w pt. He will RTC in 2 days to recheck with me.

## 2014-06-24 ENCOUNTER — Ambulatory Visit (INDEPENDENT_AMBULATORY_CARE_PROVIDER_SITE_OTHER): Payer: BC Managed Care – PPO | Admitting: Physician Assistant

## 2014-06-24 VITALS — BP 110/70 | HR 74 | Temp 98.7°F | Resp 16 | Ht 71.75 in | Wt 231.8 lb

## 2014-06-24 DIAGNOSIS — M79604 Pain in right leg: Secondary | ICD-10-CM

## 2014-06-24 DIAGNOSIS — L02419 Cutaneous abscess of limb, unspecified: Secondary | ICD-10-CM

## 2014-06-24 DIAGNOSIS — L03119 Cellulitis of unspecified part of limb: Principal | ICD-10-CM

## 2014-06-24 DIAGNOSIS — M79609 Pain in unspecified limb: Secondary | ICD-10-CM

## 2014-06-25 NOTE — Progress Notes (Signed)
   Subjective:    Patient ID: Chad Rainwaterimothy Considine, male    DOB: 09/23/1951, 63 y.o.   MRN: [id number]  HPI Pt presents to clinic for a recheck of a wound on his right distal thigh.  He has been doing wet to dry drsgs at home and though it hurts when he pulls it off he is doing it.  He has been tolerating his medications without problems.  The pain is less.  Review of Systems  Constitutional: Negative for fever and chills.  Skin: Positive for wound.       Objective:   Physical Exam  Vitals reviewed. Constitutional: He is oriented to person, place, and time. He appears well-developed and well-nourished.  HENT:  Head: Normocephalic and atraumatic.  Right Ear: External ear normal.  Left Ear: External ear normal.  Pulmonary/Chest: Effort normal.  Neurological: He is alert and oriented to person, place, and time.  Skin: Skin is warm and dry.  Drsg removed.  Wound looks good compared to his last visit.  There is mild erythema around the wound edges with minimal induration.  The wound has no necrotic tissue and has good granulation tissue along the entire cavity.  The wound cavity is about 1 cm deep.  Wet to dry drsg replaced.  Psychiatric: He has a normal mood and affect. His behavior is normal. Thought content normal.       Assessment & Plan:  Cellulitis and abscess of leg  Pain of right lower extremity  Healing would that looks much better today.  He is going to continue bid wet to dry drsg and recheck in 3 days - as long as there is no necrotic or minimal necrotic tissue he might not need to RTC or at least not as frequently.  There does not appear to any infection present at this time but now the wound just needs time to heal.  Benny LennertSarah Weber PA-C  [facility] 06/25/2014 9:32 AM

## 2014-06-27 ENCOUNTER — Ambulatory Visit (INDEPENDENT_AMBULATORY_CARE_PROVIDER_SITE_OTHER): Payer: BC Managed Care – PPO | Admitting: Physician Assistant

## 2014-06-27 VITALS — BP 118/64 | HR 55 | Temp 98.2°F | Resp 20 | Ht 71.75 in | Wt 232.4 lb

## 2014-06-27 DIAGNOSIS — S81801S Unspecified open wound, right lower leg, sequela: Secondary | ICD-10-CM

## 2014-06-27 DIAGNOSIS — IMO0002 Reserved for concepts with insufficient information to code with codable children: Secondary | ICD-10-CM

## 2014-06-27 NOTE — Progress Notes (Signed)
Patient ID: Chad Cox MRN: [id number], DOB: Feb 29, 1951 63 y.o. Date of Encounter: 06/27/2014, 8:48 PM  Chief Complaint: Wound care   See previous note  HPI: 63 y.o. y/o male presents for wound care s/p I&D on 06/15/14.  Has had multiple sharp debridments and has now changed to wet to dry dressings.  Doing well No issues or complaints Afebrile/ no chills No nausea or vomiting Tolerating Septra Pain improved significantly.  Daily dressing change Previous note reviewed  Past Medical History  Diagnosis Date  . Diabetes mellitus without complication   . Hypertension      Home Meds: Prior to Admission medications   Medication Sig Start Date End Date Taking? Authorizing Provider  aspirin 81 MG tablet Take 81 mg by mouth daily.   Yes Historical Provider, MD  lisinopril (PRINIVIL,ZESTRIL) 20 MG tablet Take 1 tablet (20 mg total) by mouth daily. 04/29/14  Yes Gwenlyn FoundJessica C Copland, MD  metFORMIN (GLUCOPHAGE) 500 MG tablet Take 1 tablet (500 mg total) by mouth daily with breakfast. 04/29/14  Yes Gwenlyn FoundJessica C Copland, MD  pravastatin (PRAVACHOL) 40 MG tablet Take 1 tablet (40 mg total) by mouth daily. 04/29/14  Yes Gwenlyn FoundJessica C Copland, MD  sulfamethoxazole-trimethoprim (BACTRIM DS) 800-160 MG per tablet Take 1 tablet by mouth 2 (two) times daily. 06/15/14  Yes Phillips OdorJeffery Anderson, MD    Allergies:  Allergies  Allergen Reactions  . Adhesive [Tape]   . Betadine [Povidone Iodine]     ROS: Constitutional: Afebrile, no chills Cardiovascular: negative for chest pain or palpitations Dermatological: Positive for wound. Negative for erythema, pain, or warmth.  GI: No nausea or vomiting   EXAM: Physical Exam: Blood pressure 118/64, pulse 55, temperature 98.2 F (36.8 C), temperature source Oral, resp. rate 20, height 5' 11.75" (1.822 m), weight 232 lb 6.4 oz (105.416 kg), SpO2 97.00%., Body mass index is 31.75 kg/(m^2). General: Well developed, well nourished, in no acute distress. Nontoxic  appearing. Head: Normocephalic, atraumatic, sclera non-icteric.  Neck: Supple. Lungs: Breathing is unlabored. Heart: Normal rate. Skin:  Warm and moist. Dressing and packing in place. No induration, erythema, or tenderness to palpation. Wound bed beefy red and bleeding spontaneously.  Neuro: Alert and oriented X 3. Moves all extremities spontaneously. Normal gait.  Psych:  Responds to questions appropriately with a normal affect.       PROCEDURE: Dressing removed Wound bed healthy, beefy, red. Wet to dry dressing applied  LAB: Culture: not done.   A/P: 63 y.o. y/o male with leg cellulitis/abscess as above - impoving.  Wound care per above Continue at home dressing changes.  Pain well controlled Recheck 48-72 hours   Grier MittsSigned, Jaydien Panepinto, PA-C 06/27/2014 8:48 PM

## 2014-06-30 ENCOUNTER — Ambulatory Visit (INDEPENDENT_AMBULATORY_CARE_PROVIDER_SITE_OTHER): Payer: BC Managed Care – PPO | Admitting: Physician Assistant

## 2014-06-30 VITALS — BP 100/64 | HR 64 | Temp 98.3°F | Resp 20 | Ht 71.75 in | Wt 232.4 lb

## 2014-06-30 DIAGNOSIS — IMO0002 Reserved for concepts with insufficient information to code with codable children: Secondary | ICD-10-CM

## 2014-06-30 DIAGNOSIS — S81801S Unspecified open wound, right lower leg, sequela: Secondary | ICD-10-CM

## 2014-06-30 NOTE — Progress Notes (Signed)
   Subjective:    Patient ID: Terel Bann, male    DOB: Feb 19, 1951, 63 y.o.   MRN: [id number]   PCP: Abbe Amsterdam, MD  Chief Complaint  Patient presents with  . Wound Care   Prior to Admission medications   Medication Sig Start Date End Date Taking? Authorizing Provider  aspirin 81 MG tablet Take 81 mg by mouth daily.   Yes Historical Provider, MD  lisinopril (PRINIVIL,ZESTRIL) 20 MG tablet Take 1 tablet (20 mg total) by mouth daily. 04/29/14  Yes Gwenlyn Found Copland, MD  metFORMIN (GLUCOPHAGE) 500 MG tablet Take 1 tablet (500 mg total) by mouth daily with breakfast. 04/29/14  Yes Gwenlyn Found Copland, MD  pravastatin (PRAVACHOL) 40 MG tablet Take 1 tablet (40 mg total) by mouth daily. 04/29/14  Yes Pearline Cables, MD    HPI  This 63 y.o. male presents for follow-up evaluation of a wound on the RIGHT thigh. He had an abscess here that was I&D'd on 0/06/2014, and he's had regular wound care since then.  His wound was quite deep, and developed considerable eschar requiring sharps debridement x 2 and then wet-to-dry dressing changes.  We hope today will be his last visit.  He has completed the antibiotic. He ran out of supplies for his dressing changes, so the last change was yesterday evening.   Review of Systems     Objective:   Physical Exam BP 100/64  Pulse 64  Temp(Src) 98.3 F (36.8 C) (Oral)  Resp 20  Ht 5' 11.75" (1.822 m)  Wt 232 lb 6 oz (105.405 kg)  BMI 31.75 kg/m2  SpO2 97% WDWNWM, A&O x 3. Dressing removed.  Bloody, shallow and clean wound bed. No induration. Non-tender.   Dry dressing applied to protect clothing.       Assessment & Plan:  1. Leg wound, right, sequela Remove today's dressing upon arriving at home.  Cover only when needed. Anticipatory guidance provided. F/U PRN.   Fernande Bras, PA-C Physician Assistant-Certified [facility]

## 2014-08-12 ENCOUNTER — Telehealth: Payer: Self-pay | Admitting: *Deleted

## 2014-08-12 NOTE — Telephone Encounter (Signed)
Called patient to follow up on diabetes, last HgA1C check was 04/29/14. He was advise to make an appointment for follow up with Dr Patsy Lageropland and he said will call back.

## 2015-01-30 ENCOUNTER — Telehealth: Payer: Self-pay

## 2015-01-30 NOTE — Telephone Encounter (Signed)
Patient called to schedule an appt with Dr Patsy Lageropland. He wanted to have a physical, I offered her first available slot for a physical. He declined stating "just schedule me her first regular appt"  I let the patient know that I would gladly schedule an appt for her first available but it would not be a physical..he says he understands. I offered him an appt in May and when asked what is was for he said he would run out of medication before then. I told patient to come on to 102 to see Dr Patsy Lageropland if he is going to run out. Patient became upset stating that we used to be a great doctors office and now he thinks he will need to find a new PCP. I explained that Dr Patsy Lageropland does not do appts everyday, but she is available at the walk in more frequent than the appt center. I also explained that our providers rotate through the walk in and appt center.   I attempted to continue scheduling appt and when asked for his name he told me his date of birth. I asked for the name again and he said he would call and talk with someone else hung up the phone.

## 2015-03-10 ENCOUNTER — Encounter: Payer: Self-pay | Admitting: Family Medicine

## 2015-03-10 ENCOUNTER — Ambulatory Visit (INDEPENDENT_AMBULATORY_CARE_PROVIDER_SITE_OTHER): Payer: BLUE CROSS/BLUE SHIELD | Admitting: Family Medicine

## 2015-03-10 VITALS — BP 125/78 | HR 69 | Temp 98.4°F | Resp 16 | Ht 71.0 in | Wt 232.0 lb

## 2015-03-10 DIAGNOSIS — E119 Type 2 diabetes mellitus without complications: Secondary | ICD-10-CM

## 2015-03-10 DIAGNOSIS — R972 Elevated prostate specific antigen [PSA]: Secondary | ICD-10-CM

## 2015-03-10 DIAGNOSIS — Z125 Encounter for screening for malignant neoplasm of prostate: Secondary | ICD-10-CM | POA: Diagnosis not present

## 2015-03-10 DIAGNOSIS — E089 Diabetes mellitus due to underlying condition without complications: Secondary | ICD-10-CM | POA: Diagnosis not present

## 2015-03-10 DIAGNOSIS — E785 Hyperlipidemia, unspecified: Secondary | ICD-10-CM | POA: Diagnosis not present

## 2015-03-10 DIAGNOSIS — I10 Essential (primary) hypertension: Secondary | ICD-10-CM | POA: Diagnosis not present

## 2015-03-10 DIAGNOSIS — Z130 Encounter for screening for diseases of the blood and blood-forming organs and certain disorders involving the immune mechanism: Secondary | ICD-10-CM | POA: Diagnosis not present

## 2015-03-10 DIAGNOSIS — E663 Overweight: Secondary | ICD-10-CM | POA: Diagnosis not present

## 2015-03-10 DIAGNOSIS — Z23 Encounter for immunization: Secondary | ICD-10-CM

## 2015-03-10 LAB — CBC
HCT: 39.2 % (ref 39.0–52.0)
Hemoglobin: 13.5 g/dL (ref 13.0–17.0)
MCH: 33.4 pg (ref 26.0–34.0)
MCHC: 34.4 g/dL (ref 30.0–36.0)
MCV: 97 fL (ref 78.0–100.0)
MPV: 9.7 fL (ref 8.6–12.4)
Platelets: 215 10*3/uL (ref 150–400)
RBC: 4.04 MIL/uL — ABNORMAL LOW (ref 4.22–5.81)
RDW: 13.4 % (ref 11.5–15.5)
WBC: 6 10*3/uL (ref 4.0–10.5)

## 2015-03-10 LAB — LIPID PANEL
Cholesterol: 149 mg/dL (ref 0–200)
HDL: 40 mg/dL (ref >=40)
LDL Cholesterol: 89 mg/dL (ref 0–99)
Total CHOL/HDL Ratio: 3.7 Ratio
Triglycerides: 100 mg/dL (ref <150)
VLDL: 20 mg/dL (ref 0–40)

## 2015-03-10 LAB — COMPREHENSIVE METABOLIC PANEL
ALT: 16 U/L (ref 0–53)
AST: 16 U/L (ref 0–37)
Albumin: 4.3 g/dL (ref 3.5–5.2)
Alkaline Phosphatase: 56 U/L (ref 39–117)
BUN: 15 mg/dL (ref 6–23)
CO2: 24 mEq/L (ref 19–32)
Calcium: 8.9 mg/dL (ref 8.4–10.5)
Chloride: 107 mEq/L (ref 96–112)
Creat: 0.86 mg/dL (ref 0.50–1.35)
Glucose, Bld: 141 mg/dL — ABNORMAL HIGH (ref 70–99)
Potassium: 3.8 mEq/L (ref 3.5–5.3)
Sodium: 141 mEq/L (ref 135–145)
Total Bilirubin: 0.6 mg/dL (ref 0.2–1.2)
Total Protein: 6.9 g/dL (ref 6.0–8.3)

## 2015-03-10 LAB — HEMOGLOBIN A1C
Hgb A1c MFr Bld: 6 % — ABNORMAL HIGH (ref <5.7)
Mean Plasma Glucose: 126 mg/dL — ABNORMAL HIGH (ref <117)

## 2015-03-10 MED ORDER — PRAVASTATIN SODIUM 40 MG PO TABS: 40.0000 mg | ORAL_TABLET | Freq: Every day | ORAL | Status: DC

## 2015-03-10 MED ORDER — LISINOPRIL 20 MG PO TABS: 20.0000 mg | ORAL_TABLET | Freq: Every day | ORAL | Status: DC

## 2015-03-10 MED ORDER — METFORMIN HCL 500 MG PO TABS: 500.0000 mg | ORAL_TABLET | Freq: Every day | ORAL | Status: DC

## 2015-03-10 NOTE — Patient Instructions (Addendum)
Great to see you today- I will be in touch with your labs asap.   Take care and best of luck with taking care of your family You got your pneumonia shot today- wait at least one month and then you can get your shingles vaccine from your drug store at your convenience.

## 2015-03-10 NOTE — Progress Notes (Signed)
[facility] [address] [place] [address] [phone]  Date:  03/10/2015   Name:  Chad Cox   DOB:  1951/03/31   MRN:  [id number]  PCP:  Abbe Amsterdam, MD    Chief Complaint: Medication Refill   History of Present Illness:  Chad Cox is a 64 y.o. very pleasant male patient who presents with the following:  Here today to recheck his chronic health conditions as below.  He is overall doing well.  He still cares for his mother; she is at home with caregivers during the day and he is with her at night.   He had fasting labs drawn today.    He has not yet had the shingles vaccine.  He is also in need of pneumonia vaccine Never a smoker He eats very well during the day but does tend to rely on take- out for dinner. His mother has a poor appetite and he just wants to find something that she will eat  His mood is ok and he is overall doing well despite challenges in his life  BP Readings from Last 3 Encounters:  03/10/15 125/78  06/30/14 100/64  06/27/14 118/64   Wt Readings from Last 3 Encounters:  03/10/15 232 lb (105.235 kg)  06/30/14 232 lb 6 oz (105.405 kg)  06/27/14 232 lb 6.4 oz (105.416 kg)   Lab Results  Component Value Date   HGBA1C 5.9 04/29/2014     Patient Active Problem List   Diagnosis Date Noted  . Obesity, unspecified 04/29/2014  . Diabetes mellitus 01/30/2012  . Hypertension 01/30/2012  . Dyslipidemia 01/30/2012    Past Medical History  Diagnosis Date  . Diabetes mellitus without complication   . Hypertension     No past surgical history on file.  History  Substance Use Topics  . Smoking status: Never Smoker   . Smokeless tobacco: Never Used  . Alcohol Use: No    No family history on file.  Allergies  Allergen Reactions  . Adhesive [Tape]   . Betadine [Povidone Iodine]     Medication list has been reviewed and updated.  Current Outpatient Prescriptions on File Prior to Visit  Medication Sig  Dispense Refill  . aspirin 81 MG tablet Take 81 mg by mouth daily.    Marland Kitchen lisinopril (PRINIVIL,ZESTRIL) 20 MG tablet Take 1 tablet (20 mg total) by mouth daily. 90 tablet 3  . metFORMIN (GLUCOPHAGE) 500 MG tablet Take 1 tablet (500 mg total) by mouth daily with breakfast. 90 tablet 3  . pravastatin (PRAVACHOL) 40 MG tablet Take 1 tablet (40 mg total) by mouth daily. 90 tablet 3   No current facility-administered medications on file prior to visit.    Review of Systems:  As per HPI- otherwise negative.   Physical Examination: Filed Vitals:   03/10/15 0819  BP: 125/78  Pulse: 69  Temp: 98.4 F (36.9 C)  Resp: 16   Filed Vitals:   03/10/15 0819  Height:  (1.803 m)  Weight: 232 lb (105.235 kg)   Body mass index is 32.37 kg/(m^2). Ideal Body Weight: Weight in (lb) to have BMI = 25: 178.9  GEN: WDWN, NAD, Non-toxic, A & O x 3, overweight, looks well HEENT: Atraumatic, Normocephalic. Neck supple. No masses, No LAD.  Ears and Nose: No external deformity. CV: RRR, No M/G/R. No JVD. No thrill. No extra heart sounds. PULM: CTA B, no wheezes, crackles, rhonchi. No retractions. No resp. distress. No accessory muscle use. EXTR:  No c/c/e NEURO Normal gait.  PSYCH: Normally interactive. Conversant. Not depressed or anxious appearing.  Calm demeanor.  Foot exam done today   Assessment and Plan: Diabetes mellitus type 2, controlled - Plan: Comprehensive metabolic panel, pravastatin (PRAVACHOL) 40 MG tablet, Microalbumin, urine, Hemoglobin A1c  Screening for prostate cancer - Plan: PSA  Essential hypertension - Plan: lisinopril (PRINIVIL,ZESTRIL) 20 MG tablet  Diabetes mellitus due to underlying condition without complications - Plan: metFORMIN (GLUCOPHAGE) 500 MG tablet, pravastatin (PRAVACHOL) 40 MG tablet  Dyslipidemia - Plan: Lipid panel  Immunization due - Plan: Varicella-zoster vaccine subcutaneous, Pneumococcal polysaccharide vaccine 23-valent greater than or equal to 2yo  subcutaneous/IM  Screening for deficiency anemia - Plan: CBC  Overweight  Mr Chad Cox has a hard time coming in to see me due to his mother's needs.  He tends to come in about once a year.  However, his DM control is generally very good Overall he is doing his best with diet and exercises despite life challenges Given pneumovax today zostavax rx Will plan further follow- up pending labs.    Signed Abbe AmsterdamJessica Copland, MD

## 2015-03-11 LAB — PSA: PSA: 2.47 ng/mL (ref <=4.00)

## 2015-03-11 LAB — MICROALBUMIN, URINE: Microalb, Ur: 2.2 mg/dL — ABNORMAL HIGH (ref <2.0)

## 2015-03-11 NOTE — Addendum Note (Signed)
Addended by: Abbe AmsterdamOPLAND, JESSICA C on: 03/11/2015 01:27 PM   Modules accepted: Orders

## 2015-03-12 ENCOUNTER — Encounter: Payer: Self-pay | Admitting: Family Medicine

## 2015-04-01 ENCOUNTER — Other Ambulatory Visit: Payer: Self-pay | Admitting: Family Medicine

## 2015-04-24 ENCOUNTER — Other Ambulatory Visit: Payer: Self-pay | Admitting: Family Medicine

## 2015-05-17 LAB — HM DIABETES EYE EXAM

## 2015-05-29 ENCOUNTER — Encounter: Payer: Self-pay | Admitting: Physician Assistant

## 2015-06-11 ENCOUNTER — Other Ambulatory Visit: Payer: Self-pay | Admitting: Family Medicine

## 2015-07-15 ENCOUNTER — Encounter: Payer: Self-pay | Admitting: Family Medicine

## 2015-11-28 ENCOUNTER — Encounter: Payer: Self-pay | Admitting: Family Medicine

## 2015-12-03 ENCOUNTER — Encounter: Payer: Self-pay | Admitting: Family Medicine

## 2016-03-10 ENCOUNTER — Other Ambulatory Visit: Payer: Self-pay | Admitting: Family Medicine

## 2016-03-28 ENCOUNTER — Other Ambulatory Visit: Payer: Self-pay | Admitting: Family Medicine

## 2016-03-29 ENCOUNTER — Other Ambulatory Visit: Payer: Self-pay | Admitting: Emergency Medicine

## 2016-03-29 DIAGNOSIS — E089 Diabetes mellitus due to underlying condition without complications: Secondary | ICD-10-CM

## 2016-03-29 MED ORDER — METFORMIN HCL 500 MG PO TABS: 500.0000 mg | ORAL_TABLET | Freq: Every day | ORAL | Status: DC

## 2016-03-31 ENCOUNTER — Telehealth: Payer: Self-pay | Admitting: Behavioral Health

## 2016-03-31 NOTE — Telephone Encounter (Signed)
Unable to reach patient at time of Pre-Visit Call.  Left message for patient to return call when available.    

## 2016-04-01 ENCOUNTER — Encounter: Payer: Self-pay | Admitting: Family Medicine

## 2016-04-01 ENCOUNTER — Ambulatory Visit (INDEPENDENT_AMBULATORY_CARE_PROVIDER_SITE_OTHER): Payer: BLUE CROSS/BLUE SHIELD | Admitting: Family Medicine

## 2016-04-01 VITALS — BP 124/73 | HR 65 | Temp 97.8°F | Ht 72.0 in | Wt 236.6 lb

## 2016-04-01 DIAGNOSIS — E089 Diabetes mellitus due to underlying condition without complications: Secondary | ICD-10-CM

## 2016-04-01 DIAGNOSIS — Z125 Encounter for screening for malignant neoplasm of prostate: Secondary | ICD-10-CM | POA: Diagnosis not present

## 2016-04-01 DIAGNOSIS — Z1159 Encounter for screening for other viral diseases: Secondary | ICD-10-CM | POA: Diagnosis not present

## 2016-04-01 DIAGNOSIS — F4321 Adjustment disorder with depressed mood: Secondary | ICD-10-CM

## 2016-04-01 DIAGNOSIS — I10 Essential (primary) hypertension: Secondary | ICD-10-CM

## 2016-04-01 DIAGNOSIS — E785 Hyperlipidemia, unspecified: Secondary | ICD-10-CM

## 2016-04-01 LAB — LIPID PANEL
Cholesterol: 147 mg/dL (ref 0–200)
HDL: 36.7 mg/dL — ABNORMAL LOW (ref >39.00)
LDL Cholesterol: 90 mg/dL (ref 0–99)
NonHDL: 110.78
Total CHOL/HDL Ratio: 4
Triglycerides: 106 mg/dL (ref 0.0–149.0)
VLDL: 21.2 mg/dL (ref 0.0–40.0)

## 2016-04-01 LAB — HEPATITIS C ANTIBODY: HCV Ab: NEGATIVE

## 2016-04-01 LAB — COMPREHENSIVE METABOLIC PANEL
ALT: 23 U/L (ref 0–53)
AST: 19 U/L (ref 0–37)
Albumin: 4.7 g/dL (ref 3.5–5.2)
Alkaline Phosphatase: 62 U/L (ref 39–117)
BUN: 13 mg/dL (ref 6–23)
CO2: 29 mEq/L (ref 19–32)
Calcium: 9.6 mg/dL (ref 8.4–10.5)
Chloride: 105 mEq/L (ref 96–112)
Creatinine, Ser: 0.86 mg/dL (ref 0.40–1.50)
GFR: 95.06 mL/min (ref >60.00)
Glucose, Bld: 152 mg/dL — ABNORMAL HIGH (ref 70–99)
Potassium: 4 mEq/L (ref 3.5–5.1)
Sodium: 140 mEq/L (ref 135–145)
Total Bilirubin: 0.8 mg/dL (ref 0.2–1.2)
Total Protein: 6.8 g/dL (ref 6.0–8.3)

## 2016-04-01 LAB — HEMOGLOBIN A1C: Hgb A1c MFr Bld: 6.5 % (ref 4.6–6.5)

## 2016-04-01 LAB — PSA: PSA: 1.63 ng/mL (ref 0.10–4.00)

## 2016-04-01 MED ORDER — LISINOPRIL 20 MG PO TABS: 20.0000 mg | ORAL_TABLET | Freq: Every day | ORAL | Status: DC

## 2016-04-01 MED ORDER — METFORMIN HCL 500 MG PO TABS: 500.0000 mg | ORAL_TABLET | Freq: Every day | ORAL | Status: DC

## 2016-04-01 MED ORDER — FLUOXETINE HCL 20 MG PO TABS: 20.0000 mg | ORAL_TABLET | Freq: Every day | ORAL | Status: DC

## 2016-04-01 MED ORDER — PRAVASTATIN SODIUM 40 MG PO TABS: ORAL_TABLET | ORAL | Status: DC

## 2016-04-01 NOTE — Progress Notes (Signed)
Pre visit review using our clinic review tool, if applicable. No additional management support is needed unless otherwise documented below in the visit note. 

## 2016-04-01 NOTE — Progress Notes (Signed)
[facility] [address], [address] [place], [place] [address] [phone]  Date:  04/01/2016   Name:  Chad Cox   DOB:  [birth year]   MRN:  [id number]  PCP:  Abbe Amsterdam, MD    Chief Complaint: Annual Exam   History of Present Illness:  Chad Cox is a 65 y.o. very pleasant male patient who presents with the following:  History of HTN, obesity, hyperlipidemia, DM.  This pt is known to me from [facility]. I last saw him about a year ago Here today for a recheck of his chronic medical conditions.  Also, he is dealing with persistent grief and sadness, maybe now turning into depression following the death of his mom last labor day. He had been her primary caregiver and they had lived together for years.  Since she is gone he feels that he has somewhat lost his purpose in life.  He is still working and enjoys his job. Most of his social circle is through work; he does work in [employer] so it is harder to socialize with co-workers after hours.  Admits that he tends to feel overwhelmed by tasks at home and ends up doing nothing some of the time.   He is interested in finding another outlet for his energies- ?volunteer work- and we discussed this  He is stable on his medications Denies any SI Never a smoker Lab Results  Component Value Date   HGBA1C 6.0* 03/10/2015   BP Readings from Last 3 Encounters:  04/01/16 124/73  03/10/15 125/78  06/30/14 100/64   Wt Readings from Last 3 Encounters:  04/01/16 236 lb 9.6 oz (107.321 kg)  03/10/15 232 lb (105.235 kg)  06/30/14 232 lb 6 oz (105.405 kg)     Patient Active Problem List   Diagnosis Date Noted  . Obesity, unspecified 04/29/2014  . Diabetes mellitus 01/30/2012  . Hypertension 01/30/2012  . Dyslipidemia 01/30/2012    Past Medical History  Diagnosis Date  . Diabetes mellitus without complication (HCC)   . Hypertension     No past surgical history on  file.  Social History  Substance Use Topics  . Smoking status: Never Smoker   . Smokeless tobacco: Never Used  . Alcohol Use: No    Family History  Problem Relation Age of Onset  . Atrial fibrillation Mother     Allergies  Allergen Reactions  . Adhesive [Tape]   . Betadine [Povidone Iodine]     Medication list has been reviewed and updated.  Current Outpatient Prescriptions on File Prior to Visit  Medication Sig Dispense Refill  . aspirin 81 MG tablet Take 81 mg by mouth daily.    Marland Kitchen lisinopril (PRINIVIL,ZESTRIL) 20 MG tablet Take 1 tablet (20 mg total) by mouth daily. 90 tablet 3  . metFORMIN (GLUCOPHAGE) 500 MG tablet Take 1 tablet (500 mg total) by mouth daily with breakfast. 30 tablet 0  . Multiple Vitamin (MULTIVITAMIN) tablet Take 1 tablet by mouth daily.    . pravastatin (PRAVACHOL) 40 MG tablet TAKE 1 TABLET(40 MG) BY MOUTH DAILY 90 tablet 0   No current facility-administered medications on file prior to visit.    Review of Systems:  As per HPI- otherwise negative.   Physical Examination: Filed Vitals:   04/01/16 0839  BP: 124/73  Pulse: 65  Temp: 97.8 F (36.6 C)   Filed Vitals:   04/01/16 0839  Height: 6' (1.829 m)  Weight: 236 lb 9.6  oz (107.321 kg)   Body mass index is 32.08 kg/(m^2). Ideal Body Weight: Weight in (lb) to have BMI = 25: 183.9  GEN: WDWN, NAD, Non-toxic, A & O x 3, overweight. Sometimes tearful today HEENT: Atraumatic, Normocephalic. Neck supple. No masses, No LAD.  Bilateral TM wnl, oropharynx normal.  PEERL,EOMI.   Ears and Nose: No external deformity. CV: RRR, No M/G/R. No JVD. No thrill. No extra heart sounds. PULM: CTA B, no wheezes, crackles, rhonchi. No retractions. No resp. distress. No accessory muscle use. ABD: S, NT, ND. No rebound. No HSM. EXTR: No c/c/e NEURO Normal gait.  PSYCH: Normally interactive. Conversant. Not depressed or anxious appearing.  Calm demeanor.    Assessment and Plan: Diabetes mellitus due to  underlying condition without complication, unspecified long term insulin use status (HCC) - Plan: metFORMIN (GLUCOPHAGE) 500 MG tablet, Comprehensive metabolic panel, Hemoglobin A1c  Essential hypertension - Plan: lisinopril (PRINIVIL,ZESTRIL) 20 MG tablet  Hyperlipidemia - Plan: pravastatin (PRAVACHOL) 40 MG tablet, Lipid panel  Need for hepatitis C screening test - Plan: Hepatitis C antibody  Screening for prostate cancer - Plan: PSA  Grief reaction - Plan: FLUoxetine (PROZAC) 20 MG tablet  Here today to follow-up on his chronic medical conditions, do labs and also to discuss grief.  I am afraid that he has moved into some depression.  Discussed ways to help him find a purpose for life outside of work today Rx for prozac and encouraged him to try this Labs pending to check his DM, lipids Will plan further follow- up pending labs. Asked him to give me an update in a month- he agrees   It was great to see you today!  I will be in touch with your labs asap I gave you an rx for prozac today- if you are interested, I think this may help you work though some of your sadness following the death of your mom.   I would also encourage you to find a new area where you can share your gifts- perhaps volunteering with kids, the elderly, or animals.  Adopting a dog is a great idea!   Dogs can be wonderful companions and also force you to leave the house and exercise.   Check out one of the [place] free magazines (like at [employer] for information about volunteer opportunities.    Take care and please keep me posted on your progress   Signed Abbe AmsterdamJessica Gilmar Bua, MD

## 2016-04-01 NOTE — Patient Instructions (Addendum)
It was great to see you today!  I will be in touch with your labs asap I gave you an rx for prozac today- if you are interested, I think this may help you work though some of your sadness following the death of your mom.   I would also encourage you to find a new area where you can share your gifts- perhaps volunteering with kids, the elderly, or animals.  Adopting a dog is a great idea!   Dogs can be wonderful companions and also force you to leave the house and exercise.   Check out one of the [place] free magazines (like at [employer] for information about volunteer opportunities.    Take care and please keep me posted on your progress

## 2016-05-04 ENCOUNTER — Other Ambulatory Visit: Payer: Self-pay | Admitting: Family Medicine

## 2016-05-16 ENCOUNTER — Other Ambulatory Visit: Payer: Self-pay | Admitting: Family Medicine

## 2016-05-21 ENCOUNTER — Encounter: Payer: Self-pay | Admitting: Family Medicine

## 2016-05-24 MED ORDER — METFORMIN HCL 500 MG PO TABS: 500.0000 mg | ORAL_TABLET | Freq: Every day | ORAL | Status: DC

## 2016-05-29 DIAGNOSIS — H524 Presbyopia: Secondary | ICD-10-CM | POA: Diagnosis not present

## 2016-05-29 DIAGNOSIS — E119 Type 2 diabetes mellitus without complications: Secondary | ICD-10-CM | POA: Diagnosis not present

## 2016-06-22 ENCOUNTER — Other Ambulatory Visit: Payer: Self-pay | Admitting: Family Medicine

## 2016-06-22 DIAGNOSIS — E089 Diabetes mellitus due to underlying condition without complications: Secondary | ICD-10-CM

## 2016-06-25 ENCOUNTER — Other Ambulatory Visit: Payer: Self-pay

## 2016-07-15 DIAGNOSIS — R162 Hepatomegaly with splenomegaly, not elsewhere classified: Secondary | ICD-10-CM | POA: Diagnosis not present

## 2016-07-15 DIAGNOSIS — E119 Type 2 diabetes mellitus without complications: Secondary | ICD-10-CM | POA: Diagnosis not present

## 2016-07-15 DIAGNOSIS — Z888 Allergy status to other drugs, medicaments and biological substances status: Secondary | ICD-10-CM | POA: Diagnosis not present

## 2016-07-15 DIAGNOSIS — I10 Essential (primary) hypertension: Secondary | ICD-10-CM | POA: Diagnosis not present

## 2016-07-15 DIAGNOSIS — R109 Unspecified abdominal pain: Secondary | ICD-10-CM | POA: Diagnosis not present

## 2016-07-15 DIAGNOSIS — K5730 Diverticulosis of large intestine without perforation or abscess without bleeding: Secondary | ICD-10-CM | POA: Diagnosis not present

## 2016-07-15 DIAGNOSIS — N200 Calculus of kidney: Secondary | ICD-10-CM | POA: Diagnosis not present

## 2016-07-15 DIAGNOSIS — Z91048 Other nonmedicinal substance allergy status: Secondary | ICD-10-CM | POA: Diagnosis not present

## 2016-07-15 DIAGNOSIS — Z7982 Long term (current) use of aspirin: Secondary | ICD-10-CM | POA: Diagnosis not present

## 2016-07-15 DIAGNOSIS — R110 Nausea: Secondary | ICD-10-CM | POA: Diagnosis not present

## 2016-07-15 DIAGNOSIS — N23 Unspecified renal colic: Secondary | ICD-10-CM | POA: Diagnosis not present

## 2016-07-15 DIAGNOSIS — K4020 Bilateral inguinal hernia, without obstruction or gangrene, not specified as recurrent: Secondary | ICD-10-CM | POA: Diagnosis not present

## 2016-07-15 DIAGNOSIS — Z87442 Personal history of urinary calculi: Secondary | ICD-10-CM | POA: Diagnosis not present

## 2016-07-25 ENCOUNTER — Other Ambulatory Visit: Payer: Self-pay | Admitting: Family Medicine

## 2016-07-25 DIAGNOSIS — E089 Diabetes mellitus due to underlying condition without complications: Secondary | ICD-10-CM

## 2016-07-26 ENCOUNTER — Other Ambulatory Visit: Payer: Self-pay | Admitting: Emergency Medicine

## 2016-07-26 DIAGNOSIS — E089 Diabetes mellitus due to underlying condition without complications: Secondary | ICD-10-CM

## 2016-07-26 MED ORDER — METFORMIN HCL 500 MG PO TABS: ORAL_TABLET | ORAL | 5 refills | Status: DC

## 2016-08-23 ENCOUNTER — Other Ambulatory Visit: Payer: Self-pay | Admitting: Emergency Medicine

## 2016-08-23 ENCOUNTER — Other Ambulatory Visit: Payer: Self-pay | Admitting: Family Medicine

## 2016-08-23 MED ORDER — LISINOPRIL 20 MG PO TABS: ORAL_TABLET | ORAL | 0 refills | Status: DC

## 2016-09-05 ENCOUNTER — Emergency Department (HOSPITAL_COMMUNITY): Payer: BLUE CROSS/BLUE SHIELD

## 2016-09-05 ENCOUNTER — Encounter (HOSPITAL_COMMUNITY): Payer: Self-pay

## 2016-09-05 ENCOUNTER — Emergency Department (HOSPITAL_COMMUNITY)
Admission: EM | Admit: 2016-09-05 | Discharge: 2016-09-06 | Disposition: A | Discharge status: Home / Self Care | Payer: BLUE CROSS/BLUE SHIELD | Attending: Emergency Medicine | Admitting: Emergency Medicine

## 2016-09-05 DIAGNOSIS — Z7984 Long term (current) use of oral hypoglycemic drugs: Secondary | ICD-10-CM | POA: Diagnosis not present

## 2016-09-05 DIAGNOSIS — E119 Type 2 diabetes mellitus without complications: Secondary | ICD-10-CM | POA: Insufficient documentation

## 2016-09-05 DIAGNOSIS — M545 Low back pain: Secondary | ICD-10-CM | POA: Diagnosis not present

## 2016-09-05 DIAGNOSIS — Z7982 Long term (current) use of aspirin: Secondary | ICD-10-CM | POA: Insufficient documentation

## 2016-09-05 DIAGNOSIS — Z79899 Other long term (current) drug therapy: Secondary | ICD-10-CM | POA: Insufficient documentation

## 2016-09-05 DIAGNOSIS — M546 Pain in thoracic spine: Secondary | ICD-10-CM

## 2016-09-05 DIAGNOSIS — I10 Essential (primary) hypertension: Secondary | ICD-10-CM | POA: Insufficient documentation

## 2016-09-05 LAB — CBC
HCT: 37.4 % — ABNORMAL LOW (ref 39.0–52.0)
Hemoglobin: 13.1 g/dL (ref 13.0–17.0)
MCH: 33.7 pg (ref 26.0–34.0)
MCHC: 35 g/dL (ref 30.0–36.0)
MCV: 96.1 fL (ref 78.0–100.0)
Platelets: 233 10*3/uL (ref 150–400)
RBC: 3.89 MIL/uL — ABNORMAL LOW (ref 4.22–5.81)
RDW: 13.1 % (ref 11.5–15.5)
WBC: 12.7 10*3/uL — ABNORMAL HIGH (ref 4.0–10.5)

## 2016-09-05 LAB — BASIC METABOLIC PANEL
Anion gap: 9 (ref 5–15)
BUN: 15 mg/dL (ref 6–20)
CO2: 22 mmol/L (ref 22–32)
Calcium: 8.9 mg/dL (ref 8.9–10.3)
Chloride: 107 mmol/L (ref 101–111)
Creatinine, Ser: 0.86 mg/dL (ref 0.61–1.24)
GFR calc Af Amer: >60 mL/min (ref >60)
GFR calc non Af Amer: >60 mL/min (ref >60)
Glucose, Bld: 216 mg/dL — ABNORMAL HIGH (ref 65–99)
Potassium: 3.7 mmol/L (ref 3.5–5.1)
Sodium: 138 mmol/L (ref 135–145)

## 2016-09-05 LAB — LIPASE, BLOOD: Lipase: 31 U/L (ref 11–51)

## 2016-09-05 LAB — HEPATIC FUNCTION PANEL
ALT: 21 U/L (ref 17–63)
AST: 21 U/L (ref 15–41)
Albumin: 4.5 g/dL (ref 3.5–5.0)
Alkaline Phosphatase: 62 U/L (ref 38–126)
Bilirubin, Direct: 0.1 mg/dL (ref 0.1–0.5)
Indirect Bilirubin: 0.8 mg/dL (ref 0.3–0.9)
Total Bilirubin: 0.9 mg/dL (ref 0.3–1.2)
Total Protein: 7.2 g/dL (ref 6.5–8.1)

## 2016-09-05 LAB — I-STAT TROPONIN, ED: Troponin i, poc: 0 ng/mL (ref 0.00–0.08)

## 2016-09-05 LAB — D-DIMER, QUANTITATIVE (NOT AT ARMC): D-Dimer, Quant: 0.3 ug/mL-FEU (ref 0.00–0.50)

## 2016-09-05 MED ORDER — HYDROMORPHONE HCL 1 MG/ML IJ SOLN
1.0000 mg | Freq: Once | INTRAMUSCULAR | Status: AC
  Administered 2016-09-05: 1 mg via INTRAVENOUS
  Filled 2016-09-05: qty 1

## 2016-09-05 MED ORDER — KETOROLAC TROMETHAMINE 15 MG/ML IJ SOLN
15.0000 mg | Freq: Once | INTRAMUSCULAR | Status: AC
  Administered 2016-09-05: 15 mg via INTRAVENOUS
  Filled 2016-09-05: qty 1

## 2016-09-05 MED ORDER — ONDANSETRON HCL 4 MG/2ML IJ SOLN
4.0000 mg | Freq: Once | INTRAMUSCULAR | Status: AC
  Administered 2016-09-05: 4 mg via INTRAVENOUS
  Filled 2016-09-05: qty 2

## 2016-09-05 NOTE — ED Triage Notes (Addendum)
PT C/O RIGHT UPPER BACK PAIN RADIATING TO THE RIGHT UPPER CHEST ON AND OFF SINCE Friday. PT STS THE PAIN GOT WORSE TONIGHT AROUND 6 PM. PT DENIES INJURY. PT STS "IT FEELS LIKE THE PAIN IS SHOOTING RIGHT THROUGH TO MY CHEST."

## 2016-09-05 NOTE — ED Provider Notes (Signed)
[facility]-EMERGENCY DEPT Provider Note   CSN: [id number] Arrival date & time: 09/05/16  2133     History   Chief Complaint Chief Complaint  Patient presents with  . Back Pain    HPI Chad Cox is a 65 y.o. male.  The history is provided by the patient. No language interpreter was used.  Back Pain      Chad Cox is a 65 y.o. male who presents to the Emergency Department complaining of back pain.  He reports mid thoracic back pain that radiates to the right side.  He describes the pain as a headache in his back and it is constant in nature.    Pain started two days ago.  He took flexeril without relief.  The pain is worse with deep breaths.  He had nausea two days ago - now resolved.    He had similar sxs previously that started over labor day, present for several days.  He was seen [facility] at that time.  He had a normal UA and CT scan and was treated for a muscle spasm.  His sxs resolved after a few days.    He denies fever, dysuria, diarrhea. The pain at times wraps around to the front of his right chest.    Past Medical History:  Diagnosis Date  . Diabetes mellitus without complication (HCC)   . Hypertension     Patient Active Problem List   Diagnosis Date Noted  . Obesity, unspecified 04/29/2014  . Diabetes mellitus 01/30/2012  . Hypertension 01/30/2012  . Dyslipidemia 01/30/2012    History reviewed. No pertinent surgical history.     Home Medications    Prior to Admission medications   Medication Sig Start Date End Date Taking? Authorizing Provider  aspirin 81 MG tablet Take 81 mg by mouth daily.   Yes Historical Provider, MD  cyclobenzaprine (FLEXERIL) 5 MG tablet Take 5 mg by mouth daily as needed for muscle pain. 07/15/16  Yes Historical Provider, MD  FLUoxetine (PROZAC) 20 MG tablet Take 1 tablet (20 mg total) by mouth daily. Increase to 2 pills after 2-3 weeks Patient taking differently: Take 40 mg by mouth daily.  04/01/16  Yes Jessica C  Copland, MD  lisinopril (PRINIVIL,ZESTRIL) 20 MG tablet TAKE 1 TABLET(20 MG) BY MOUTH DAILY 08/23/16  Yes Jessica C Copland, MD  metFORMIN (GLUCOPHAGE) 500 MG tablet TAKE 1 TABLET(500 MG) BY MOUTH DAILY WITH BREAKFAST 07/26/16  Yes Gwenlyn FoundJessica C Copland, MD  Multiple Vitamin (MULTIVITAMIN) tablet Take 1 tablet by mouth daily.   Yes Historical Provider, MD  naproxen sodium (ANAPROX) 220 MG tablet Take 660 mg by mouth 2 (two) times daily as needed (pain).   Yes Historical Provider, MD  pravastatin (PRAVACHOL) 40 MG tablet TAKE 1 TABLET(40 MG) BY MOUTH DAILY 04/01/16  Yes Gwenlyn FoundJessica C Copland, MD  HYDROcodone-acetaminophen (NORCO/VICODIN) 5-325 MG tablet Take 1 tablet by mouth every 6 (six) hours as needed. 09/06/16   Tilden FossaElizabeth Pearce Littlefield, MD  lisinopril (PRINIVIL,ZESTRIL) 20 MG tablet Take 1 tablet (20 mg total) by mouth daily. Patient not taking: Reported on 09/06/2016 04/01/16   Pearline CablesJessica C Copland, MD    Family History Family History  Problem Relation Age of Onset  . Atrial fibrillation Mother     Social History Social History  Substance Use Topics  . Smoking status: Never Smoker  . Smokeless tobacco: Never Used  . Alcohol use No     Allergies   Adhesive [tape] and Betadine [povidone iodine]   Review  of Systems Review of Systems  Musculoskeletal: Positive for back pain.  All other systems reviewed and are negative.    Physical Exam Updated Vital Signs BP 137/67   Pulse 72   Temp 98.7 F (37.1 C) (Oral)   Resp 17   Ht 6' (1.829 m)   Wt 243 lb 6 oz (110.4 kg)   SpO2 95%   BMI 33.01 kg/m   Physical Exam  Constitutional: He is oriented to person, place, and time. He appears well-developed and well-nourished.  HENT:  Head: Normocephalic and atraumatic.  Cardiovascular: Normal rate and regular rhythm.   No murmur heard. Pulmonary/Chest: Effort normal and breath sounds normal. No respiratory distress. He exhibits no tenderness.  Abdominal: Soft. There is no tenderness. There is no  rebound and no guarding.  Musculoskeletal: He exhibits no edema or tenderness.  No discrete bony thoracic tenderness  Neurological: He is alert and oriented to person, place, and time.  Skin: Skin is warm and dry.  Psychiatric: He has a normal mood and affect. His behavior is normal.  Nursing note and vitals reviewed.    ED Treatments / Results  Labs (all labs ordered are listed, but only abnormal results are displayed) Labs Reviewed  BASIC METABOLIC PANEL - Abnormal; Notable for the following:       Result Value   Glucose, Bld 216 (*)    All other components within normal limits  CBC - Abnormal; Notable for the following:    WBC 12.7 (*)    RBC 3.89 (*)    HCT 37.4 (*)    All other components within normal limits  HEPATIC FUNCTION PANEL  LIPASE, BLOOD  D-DIMER, QUANTITATIVE (NOT AT [facility])  I-STAT TROPOININ, ED    EKG  EKG Interpretation  Date/Time:  Sunday September 05 2016 22:07:24 EDT Ventricular Rate:  72 PR Interval:    QRS Duration: 92 QT Interval:  399 QTC Calculation: 437 R Axis:   80 Text Interpretation:  Sinus rhythm Low voltage, precordial leads Confirmed by Lincoln Brigham [id number]) on 09/05/2016 10:24:59 PM       Radiology Dg Chest 2 View  Result Date: 09/05/2016 CLINICAL DATA:  Acute onset of severe lower back pain. Initial encounter. EXAM: CHEST  2 VIEW COMPARISON:  None. FINDINGS: The lungs are well-aerated and clear. There is no evidence of focal opacification, pleural effusion or pneumothorax. The heart is normal in size; the mediastinal contour is within normal limits. No acute osseous abnormalities are seen. IMPRESSION: No acute cardiopulmonary process seen. Electronically Signed   By: Roanna Raider M.D.   On: 09/05/2016 22:58    Procedures Procedures (including critical care time)  Medications Ordered in ED Medications  HYDROmorphone (DILAUDID) injection 1 mg (1 mg Intravenous Given 09/05/16 2317)  ondansetron (ZOFRAN) injection 4 mg (4 mg  Intravenous Given 09/05/16 2313)  ketorolac (TORADOL) 15 MG/ML injection 15 mg (15 mg Intravenous Given 09/05/16 2342)     Initial Impression / Assessment and Plan / ED Course  I have reviewed the triage vital signs and the nursing notes.  Pertinent labs & imaging results that were available during my care of the patient were reviewed by me and considered in my medical decision making (see chart for details).  Clinical Course   Patient here for evaluation of right sided thoracic back pain. He is neurovascularly intact on examination with no evidence of zoster. Presentation is not consistent with ACS, PE, dissection, cholecystitis, epidural abscess. His symptoms resolved after treatment in the ED. Discussed  home care for musculoskeletal back pain, outpatient follow-up, return precautions.  CBC with mild leukocytosis that was present on prior CBC from September of this year.  Final Clinical Impressions(s) / ED Diagnoses   Final diagnoses:  Acute right-sided thoracic back pain    New Prescriptions New Prescriptions   HYDROCODONE-ACETAMINOPHEN (NORCO/VICODIN) 5-325 MG TABLET    Take 1 tablet by mouth every 6 (six) hours as needed.     Tilden FossaElizabeth Denham Mose, MD 09/06/16 [phone]

## 2016-09-06 MED ORDER — HYDROCODONE-ACETAMINOPHEN 5-325 MG PO TABS: 1.0000 | ORAL_TABLET | Freq: Four times a day (QID) | ORAL | 0 refills | Status: DC | PRN

## 2016-11-23 ENCOUNTER — Other Ambulatory Visit: Payer: Self-pay | Admitting: Family Medicine

## 2016-11-23 DIAGNOSIS — F4321 Adjustment disorder with depressed mood: Secondary | ICD-10-CM

## 2016-11-24 ENCOUNTER — Other Ambulatory Visit: Payer: Self-pay | Admitting: Family Medicine

## 2017-01-12 ENCOUNTER — Other Ambulatory Visit: Payer: Self-pay | Admitting: Family Medicine

## 2017-01-12 DIAGNOSIS — F4321 Adjustment disorder with depressed mood: Secondary | ICD-10-CM

## 2017-02-03 ENCOUNTER — Other Ambulatory Visit: Payer: Self-pay | Admitting: Family Medicine

## 2017-02-03 DIAGNOSIS — E089 Diabetes mellitus due to underlying condition without complications: Secondary | ICD-10-CM

## 2017-03-26 ENCOUNTER — Encounter: Payer: Self-pay | Admitting: Family Medicine

## 2017-03-26 ENCOUNTER — Ambulatory Visit (INDEPENDENT_AMBULATORY_CARE_PROVIDER_SITE_OTHER): Payer: BLUE CROSS/BLUE SHIELD | Admitting: Family Medicine

## 2017-03-26 ENCOUNTER — Ambulatory Visit (INDEPENDENT_AMBULATORY_CARE_PROVIDER_SITE_OTHER): Payer: BLUE CROSS/BLUE SHIELD

## 2017-03-26 VITALS — BP 129/75 | HR 65 | Temp 97.3°F | Resp 16 | Ht 72.0 in

## 2017-03-26 DIAGNOSIS — R1032 Left lower quadrant pain: Secondary | ICD-10-CM

## 2017-03-26 DIAGNOSIS — R3129 Other microscopic hematuria: Secondary | ICD-10-CM

## 2017-03-26 DIAGNOSIS — R7303 Prediabetes: Secondary | ICD-10-CM

## 2017-03-26 DIAGNOSIS — K5732 Diverticulitis of large intestine without perforation or abscess without bleeding: Secondary | ICD-10-CM

## 2017-03-26 DIAGNOSIS — Z87442 Personal history of urinary calculi: Secondary | ICD-10-CM | POA: Diagnosis not present

## 2017-03-26 DIAGNOSIS — Z8719 Personal history of other diseases of the digestive system: Secondary | ICD-10-CM

## 2017-03-26 LAB — POC MICROSCOPIC URINALYSIS (UMFC): Mucus: ABSENT

## 2017-03-26 LAB — POCT CBC
Granulocyte percent: 80.3 %G — AB (ref 37–80)
HCT, POC: 37.6 % — AB (ref 43.5–53.7)
Hemoglobin: 13.5 g/dL — AB (ref 14.1–18.1)
Lymph, poc: 1.6 (ref 0.6–3.4)
MCH, POC: 34.4 pg — AB (ref 27–31.2)
MCHC: 35.9 g/dL — AB (ref 31.8–35.4)
MCV: 95.9 fL (ref 80–97)
MID (cbc): 0.9 (ref 0–0.9)
MPV: 6.7 fL (ref 0–99.8)
POC Granulocyte: 10 — AB (ref 2–6.9)
POC LYMPH PERCENT: 12.6 %L (ref 10–50)
POC MID %: 7.1 %M (ref 0–12)
Platelet Count, POC: 258 10*3/uL (ref 142–424)
RBC: 3.92 M/uL — AB (ref 4.69–6.13)
RDW, POC: 13 %
WBC: 12.5 10*3/uL — AB (ref 4.6–10.2)

## 2017-03-26 LAB — POCT URINALYSIS DIP (MANUAL ENTRY)
Bilirubin, UA: NEGATIVE
Glucose, UA: NEGATIVE mg/dL
Leukocytes, UA: NEGATIVE
Nitrite, UA: NEGATIVE
Protein Ur, POC: 30 mg/dL — AB
Spec Grav, UA: 1.02 (ref 1.010–1.025)
Urobilinogen, UA: 0.2 E.U./dL
pH, UA: 5.5 (ref 5.0–8.0)

## 2017-03-26 LAB — GLUCOSE, POCT (MANUAL RESULT ENTRY): POC Glucose: 203 mg/dl — AB (ref 70–99)

## 2017-03-26 MED ORDER — KETOROLAC TROMETHAMINE 30 MG/ML IJ SOLN
30.0000 mg | Freq: Once | INTRAMUSCULAR | Status: AC
  Administered 2017-03-26: 30 mg via INTRAMUSCULAR

## 2017-03-26 MED ORDER — METRONIDAZOLE 500 MG PO TABS: 500.0000 mg | ORAL_TABLET | Freq: Two times a day (BID) | ORAL | 0 refills | Status: DC

## 2017-03-26 MED ORDER — CIPROFLOXACIN HCL 500 MG PO TABS: 500.0000 mg | ORAL_TABLET | Freq: Two times a day (BID) | ORAL | 0 refills | Status: DC

## 2017-03-26 MED ORDER — HYDROCODONE-ACETAMINOPHEN 5-325 MG PO TABS: ORAL_TABLET | ORAL | 0 refills | Status: DC

## 2017-03-26 NOTE — Progress Notes (Signed)
Patient ID: Chad Cox, male    DOB: 07-24-1951  Age: 66 y.o. MRN: [id number]  Chief Complaint  Patient presents with  . Abdominal Pain    LLQ pain since Thursday     Subjective:   66 year old man with left lower quadrant abdominal pain that hit him in the early morning hours yesterday. It then subsided but nothing has come back. It is hurting him badly this morning. He has not had any fever. He did feel a little nauseated when he was on the toilet. He had a normal bowel movement this morning without any relief. He's not noticed any blood in his urine and has not had any dysuria. He does have a history of kidney stones, but they usually hurts more back up in the back. He had an evaluation at [facility] for his back sometime in the last couple of years and was told it looked like he had some little kidney stones that were not the cause of his pain at that time. His back pain then was back strain. He has eaten some oatmeal yesterday and today, nothing much else. He had one old hydrocodone at home that he took about 3 hours ago. The pain is fairly specific in the left lower quadrant, and he is not a complainer and just relates the pain being about a 6 right now but he looks like it may be worse than that.  He has a history of being prediabetic, which is what he is on metformin for. He has been on aspirin but has never had any GI bleeding issues.  Current allergies, medications, problem list, past/family and social histories reviewed.  Objective:  BP 129/75 (BP Location: Right Arm, Patient Position: Sitting, Cuff Size: Small)   Pulse 65   Temp 97.3 F (36.3 C) (Oral)   Resp 16   Ht 6' (1.829 m)   SpO2 99%   He is fairly flat affect it, and doesn't think it's from the pain pills as much is just from being miserable with the pain. Chest clear. Heart regular. Abdomen has minimal bowel sounds. Generally soft and nondistended. He is tender in the left lower quadrant laterally above the pelvic  brim. Essentially no CVA tenderness. Denies any pain down into his genitalia. He has not seen any blood in the stool or urine. Results for orders placed or performed in visit on 03/26/17  POCT Microscopic Urinalysis ([facility])  Result Value Ref Range   WBC,UR,HPF,POC Moderate (A) None WBC/hpf   RBC,UR,HPF,POC Too numerous to count  (A) None RBC/hpf   Bacteria Moderate (A) None, Too numerous to count   Mucus Absent Absent   Epithelial Cells, UR Per Microscopy None None, Too numerous to count cells/hpf  POCT urinalysis dipstick  Result Value Ref Range   Color, UA yellow yellow   Clarity, UA clear clear   Glucose, UA negative negative mg/dL   Bilirubin, UA negative negative   Ketones, POC UA trace (5) (A) negative mg/dL   Spec Grav, UA 7.425 9.563 - 1.025   Blood, UA large (A) negative   pH, UA 5.5 5.0 - 8.0   Protein Ur, POC =30 (A) negative mg/dL   Urobilinogen, UA 0.2 0.2 or 1.0 E.U./dL   Nitrite, UA Negative Negative   Leukocytes, UA Negative Negative  POCT CBC  Result Value Ref Range   WBC 12.5 (A) 4.6 - 10.2 K/uL   Lymph, poc 1.6 0.6 - 3.4   POC LYMPH PERCENT 12.6 10 - 50 %L  MID (cbc) 0.9 0 - 0.9   POC MID % 7.1 0 - 12 %M   POC Granulocyte 10.0 (A) 2 - 6.9   Granulocyte percent 80.3 (A) 37 - 80 %G   RBC 3.92 (A) 4.69 - 6.13 M/uL   Hemoglobin 13.5 (A) 14.1 - 18.1 g/dL   HCT, POC 46.937.6 (A) 62.943.5 - 53.7 %   MCV 95.9 80 - 97 fL   MCH, POC 34.4 (A) 27 - 31.2 pg   MCHC 35.9 (A) 31.8 - 35.4 g/dL   RDW, POC 52.813.0 %   Platelet Count, POC 258 142 - 424 K/uL   MPV 6.7 0 - 99.8 fL  POCT glucose (manual entry)  Result Value Ref Range   POC Glucose 203 (A) 70 - 99 mg/dl     Assessment & Plan:   Assessment: 1. Abdominal pain, left lower quadrant       Plan: Since he has microscopic hematuria it is suspicious for a kidney stone but he doesn't have the CVA tenderness that I would expect. I reviewed back his old chart and he did have diverticulosis a few years ago when he had his  colonoscopy, with scattered diverticuli throughout the colon. That is certainly another possibility for him.  Orders Placed This Encounter  Procedures  . POCT Microscopic Urinalysis ([facility])  . POCT urinalysis dipstick    No orders of the defined types were placed in this encounter.   This is most consistent with diverticulitis, though his urine findings would indicate the possibility of a kidney stone sitting up in there somewhere. See instructions.     Patient Instructions       IF you received an x-ray today, you will receive an invoice from [facility]. Please contact [facility] at [phone] with questions or concerns regarding your invoice.   IF you received labwork today, you will receive an invoice from [facility]. Please contact [facility] at [phone] with questions or concerns regarding your invoice.   Our billing staff will not be able to assist you with questions regarding bills from these companies.  You will be contacted with the lab results as soon as they are available. The fastest way to get your results is to activate your My Chart account. Instructions are located on the last page of this paperwork. If you have not heard from us regarding the results in 2 weeks, please contact this office.         No Follow-up on file.   Freddrick Gladson, MD 03/26/2017

## 2017-03-26 NOTE — Patient Instructions (Addendum)
Drink lots of fluids to flush her kidneys well.  Take the hydrocodone pain pills every 4-6 hours as needed for pain.  Take the 2 antibiotics, ciprofloxacin and metronidazole each one pill twice daily with food.  If you get feeling worse with fever, vomiting, passing blood, more intense pain, or other concerns go to the emergency room and you may need to have a CT scan done.  Follow-up with Dr. Dallas Schimkeopeland as needed. You should have a follow-up urinalysis done to see if the blood clears from the urine. A culture is pending to see if there is any infection in your kidneys, but that will take several days.    IF you received an x-ray today, you will receive an invoice from [facility]. Please contact [facility] at [phone] with questions or concerns regarding your invoice.   IF you received labwork today, you will receive an invoice from [facility]. Please contact [facility] at [phone] with questions or concerns regarding your invoice.   Our billing staff will not be able to assist you with questions regarding bills from these companies.  You will be contacted with the lab results as soon as they are available. The fastest way to get your results is to activate your My Chart account. Instructions are located on the last page of this paperwork. If you have not heard from us regarding the results in 2 weeks, please contact this office.

## 2017-03-27 LAB — URINE CULTURE

## 2017-03-28 ENCOUNTER — Encounter: Payer: Self-pay | Admitting: Medical

## 2017-03-28 ENCOUNTER — Ambulatory Visit (INDEPENDENT_AMBULATORY_CARE_PROVIDER_SITE_OTHER): Payer: BLUE CROSS/BLUE SHIELD | Admitting: Medical

## 2017-03-28 ENCOUNTER — Ambulatory Visit (HOSPITAL_BASED_OUTPATIENT_CLINIC_OR_DEPARTMENT_OTHER)
Admission: RE | Admit: 2017-03-28 | Discharge: 2017-03-28 | Disposition: A | Discharge status: Home / Self Care | Payer: BLUE CROSS/BLUE SHIELD | Source: Ambulatory Visit | Attending: Medical | Admitting: Medical

## 2017-03-28 VITALS — BP 124/74 | HR 74 | Temp 97.9°F | Resp 16 | Ht 72.0 in | Wt 240.0 lb

## 2017-03-28 DIAGNOSIS — I2510 Atherosclerotic heart disease of native coronary artery without angina pectoris: Secondary | ICD-10-CM | POA: Insufficient documentation

## 2017-03-28 DIAGNOSIS — R109 Unspecified abdominal pain: Secondary | ICD-10-CM

## 2017-03-28 DIAGNOSIS — I700 Atherosclerosis of aorta: Secondary | ICD-10-CM | POA: Diagnosis not present

## 2017-03-28 DIAGNOSIS — K5730 Diverticulosis of large intestine without perforation or abscess without bleeding: Secondary | ICD-10-CM | POA: Diagnosis not present

## 2017-03-28 DIAGNOSIS — D72829 Elevated white blood cell count, unspecified: Secondary | ICD-10-CM

## 2017-03-28 DIAGNOSIS — N132 Hydronephrosis with renal and ureteral calculous obstruction: Secondary | ICD-10-CM | POA: Diagnosis not present

## 2017-03-28 DIAGNOSIS — R1032 Left lower quadrant pain: Secondary | ICD-10-CM

## 2017-03-28 DIAGNOSIS — K760 Fatty (change of) liver, not elsewhere classified: Secondary | ICD-10-CM | POA: Insufficient documentation

## 2017-03-28 DIAGNOSIS — R319 Hematuria, unspecified: Secondary | ICD-10-CM | POA: Diagnosis not present

## 2017-03-28 DIAGNOSIS — R10A0 Flank pain, unspecified side: Secondary | ICD-10-CM

## 2017-03-28 DIAGNOSIS — M5136 Other intervertebral disc degeneration, lumbar region: Secondary | ICD-10-CM | POA: Insufficient documentation

## 2017-03-28 DIAGNOSIS — K5732 Diverticulitis of large intestine without perforation or abscess without bleeding: Secondary | ICD-10-CM

## 2017-03-28 LAB — COMPREHENSIVE METABOLIC PANEL
ALT: 13 U/L (ref 0–53)
AST: 14 U/L (ref 0–37)
Albumin: 4.2 g/dL (ref 3.5–5.2)
Alkaline Phosphatase: 61 U/L (ref 39–117)
BUN: 14 mg/dL (ref 6–23)
CO2: 26 mEq/L (ref 19–32)
Calcium: 9.1 mg/dL (ref 8.4–10.5)
Chloride: 105 mEq/L (ref 96–112)
Creatinine, Ser: 1.7 mg/dL — ABNORMAL HIGH (ref 0.40–1.50)
GFR: 43.16 mL/min — ABNORMAL LOW (ref >60.00)
Glucose, Bld: 216 mg/dL — ABNORMAL HIGH (ref 70–99)
Potassium: 4 mEq/L (ref 3.5–5.1)
Sodium: 138 mEq/L (ref 135–145)
Total Bilirubin: 0.7 mg/dL (ref 0.2–1.2)
Total Protein: 6.8 g/dL (ref 6.0–8.3)

## 2017-03-28 LAB — CBC WITH DIFFERENTIAL/PLATELET
Basophils Absolute: 0 10*3/uL (ref 0.0–0.1)
Basophils Relative: 0.4 % (ref 0.0–3.0)
Eosinophils Absolute: 0.2 10*3/uL (ref 0.0–0.7)
Eosinophils Relative: 1.3 % (ref 0.0–5.0)
HCT: 36.5 % — ABNORMAL LOW (ref 39.0–52.0)
Hemoglobin: 12.9 g/dL — ABNORMAL LOW (ref 13.0–17.0)
Lymphocytes Relative: 5 % — ABNORMAL LOW (ref 12.0–46.0)
Lymphs Abs: 0.6 10*3/uL — ABNORMAL LOW (ref 0.7–4.0)
MCHC: 35.2 g/dL (ref 30.0–36.0)
MCV: 96.7 fl (ref 78.0–100.0)
Monocytes Absolute: 0.8 10*3/uL (ref 0.1–1.0)
Monocytes Relative: 6.9 % (ref 3.0–12.0)
Neutro Abs: 10.6 10*3/uL — ABNORMAL HIGH (ref 1.4–7.7)
Neutrophils Relative %: 86.4 % — ABNORMAL HIGH (ref 43.0–77.0)
Platelets: 222 10*3/uL (ref 150.0–400.0)
RBC: 3.77 Mil/uL — ABNORMAL LOW (ref 4.22–5.81)
RDW: 13 % (ref 11.5–15.5)
WBC: 12.2 10*3/uL — ABNORMAL HIGH (ref 4.0–10.5)

## 2017-03-28 MED ORDER — KETOROLAC TROMETHAMINE 60 MG/2ML IM SOLN
60.0000 mg | Freq: Once | INTRAMUSCULAR | Status: AC
  Administered 2017-03-28: 60 mg via INTRAMUSCULAR

## 2017-03-28 MED ORDER — ONDANSETRON 8 MG PO TBDP: 8.0000 mg | ORAL_TABLET | Freq: Three times a day (TID) | ORAL | 0 refills | Status: DC | PRN

## 2017-03-28 MED ORDER — TAMSULOSIN HCL 0.4 MG PO CAPS: 0.4000 mg | ORAL_CAPSULE | Freq: Every day | ORAL | 0 refills | Status: DC

## 2017-03-28 MED ORDER — HYDROCODONE-ACETAMINOPHEN 5-325 MG PO TABS: 1.0000 | ORAL_TABLET | Freq: Four times a day (QID) | ORAL | 0 refills | Status: DC | PRN

## 2017-03-28 NOTE — Patient Instructions (Addendum)
For your recent abdomen pain you have been treated with meds often adequate for diverticulitis or  urinary tract infection. Pain may be kidney stone as well.   Will get cbc, cmp stat. Urine did show some blood and ketone. You express not wanting to go to ED. You appear mild dehydrated. Advise hydrate with [employer] water. If you worsen may need ED evaluation.  We gave you toradol 60 mg im for pain. Refilling your norco pain med.  For nausea zofran.  Please get labs done first and then head to radiogy dept. I asked study to be stat/hold and call. We will let you know results when they are in.  Follow up  Wed around11 am or as needed

## 2017-03-28 NOTE — Progress Notes (Signed)
Subjective:    Patient ID: Chad Cox, male    DOB: [birth year], 66 y.o.   MRN: [id number]  HPI   Pt in with some left side flank area pain. Pt states pain started around 12:30 in am on Friday. Pt states at first felt like he had gas. He states on Friday during the day did not bother him much. Saturday he woke up in morning had progressively worse pain . He had progressively worse pain and went to urgent medical. They thought he had some urine finding showing  Infection per pt report. He state uc thought maybe kidney stone or diverticulitis. Pt was prescribed 2 cipro and flagyl  Pt states was told to go to ED in event his symptom worsen.  He  had negative xray at urgent are of abdomen  and  urine culture showed less than 10,000 bacteria.   Pt just vomited one time today.  Pt pain level currently 3/10. On exam pain was 4-5/10. He is tired but he is not sleeping well due to discomfort. The urgent care gave him hydrocodone. Not helping much.  Last bm was 9:30 am . He is hydrating well. He admits he has not eaten moderate amount.  No blood or black appearing stools     Review of Systems  Constitutional: Negative for chills, fatigue and fever.  Respiratory: Negative for chest tightness, shortness of breath and wheezing.   Cardiovascular: Negative for chest pain and palpitations.  Gastrointestinal: Positive for abdominal pain, nausea and vomiting. Negative for abdominal distention, blood in stool, constipation, diarrhea and rectal pain.  Genitourinary: Negative for difficulty urinating and enuresis.  Musculoskeletal: Positive for back pain.       Told MA back pain but with me pain more abdominal.  Skin: Negative for rash.  Neurological: Negative for dizziness, weakness, light-headedness, numbness and headaches.  Hematological: Negative for adenopathy. Does not bruise/bleed easily.  Psychiatric/Behavioral: Negative for agitation and confusion.   Past Medical History:  Diagnosis  Date  . Diabetes mellitus without complication (HCC)   . Hypertension      Social History   Social History  . Marital status: Single    Spouse name: N/A  . Number of children: N/A  . Years of education: N/A   Occupational History  . Not on file.   Social History Main Topics  . Smoking status: Never Smoker  . Smokeless tobacco: Never Used  . Alcohol use No  . Drug use: No  . Sexual activity: No   Other Topics Concern  . Not on file   Social History Narrative  . No narrative on file    No past surgical history on file.  Family History  Problem Relation Age of Onset  . Atrial fibrillation Mother     Allergies  Allergen Reactions  . Adhesive [Tape]     "acts like poison ivy" blisters up, itching  . Betadine [Povidone Iodine] Other (See Comments)    Blisters, itching    Current Outpatient Prescriptions on File Prior to Visit  Medication Sig Dispense Refill  . aspirin 81 MG tablet Take 81 mg by mouth daily.    . ciprofloxacin (CIPRO) 500 MG tablet Take 1 tablet (500 mg total) by mouth 2 (two) times daily. 14 tablet 0  . cyclobenzaprine (FLEXERIL) 5 MG tablet Take 5 mg by mouth daily as needed for muscle pain.  0  . FLUoxetine (PROZAC) 20 MG tablet TAKE 1 TABLET BY MOUTH EVERY DAY. INCREASE TO 2 TABLETS EVERY  DAY AFTER 2-3 WEEKS 60 tablet 0  . HYDROcodone-acetaminophen (NORCO) 5-325 MG tablet Take 1 every 4-6 hours as needed for pain 12 tablet 0  . lisinopril (PRINIVIL,ZESTRIL) 20 MG tablet Take 1 tablet (20 mg total) by mouth daily. 90 tablet 3  . lisinopril (PRINIVIL,ZESTRIL) 20 MG tablet TAKE 1 TABLET(20 MG) BY MOUTH DAILY 90 tablet 0  . metFORMIN (GLUCOPHAGE) 500 MG tablet TAKE 1 TABLET(500 MG) BY MOUTH DAILY WITH BREAKFAST 30 tablet 0  . metroNIDAZOLE (FLAGYL) 500 MG tablet Take 1 tablet (500 mg total) by mouth 2 (two) times daily with a meal. DO NOT CONSUME ALCOHOL WHILE TAKING THIS MEDICATION. 14 tablet 0  . Multiple Vitamin (MULTIVITAMIN) tablet Take 1 tablet  by mouth daily.    . naproxen sodium (ANAPROX) 220 MG tablet Take 660 mg by mouth 2 (two) times daily as needed (pain).    . pravastatin (PRAVACHOL) 40 MG tablet TAKE 1 TABLET(40 MG) BY MOUTH DAILY 90 tablet 3   No current facility-administered medications on file prior to visit.     BP 124/74 (BP Location: Right Arm, Patient Position: Sitting, Cuff Size: Normal)   Pulse 74   Temp 97.9 F (36.6 C) (Oral)   Resp 16   Ht 6' (1.829 m)   Wt 240 lb (108.9 kg)   SpO2 94%   BMI 32.55 kg/m       Objective:   Physical Exam  General Mental Status- Alert. General Appearance- Not in acute distress.   Skin General: Color- Normal Color. Moisture- Normal Moisture.  Neck Carotid Arteries- Normal color. Moisture- Normal Moisture. No carotid bruits. No JVD.  Chest and Lung Exam Auscultation: Breath Sounds:-Normal.  Cardiovascular Auscultation:Rythm- Regular. Murmurs & Other Heart Sounds:Auscultation of the heart reveals- No Murmurs.  Abdomen Inspection:-Inspeection Normal. Palpation/Percussion:Note:No mass. Palpation and Percussion of the abdomen reveal- aoubt 6 cm left of umbilicus mild- moderate Tender, Non Distended + BS, no rebound or guarding.  Neurologic Cranial Nerve exam:- CN III-XII intact(No nystagmus), symmetric smile. Strength:- 5/5 equal and symmetric strength both upper and lower extremities.      Assessment & Plan:  For your recent abdomen pain you have been treated with meds often adequate for diverticulitis or  urinary tract infection. Pain may be kidney stone as well.  Will get cbc, cmp stat. Urine did show some blood and ketone. You express not wanting to go to ED. You appear mild dehydrated. Advise hydrate with [employer] water.   We gave you toradol 60 mg im for pain. Refilling your norco pain med.  For nausea zofran.  Please get labs done first and then head to radiogy dept. I asked study to be stat/hold and call. We will let you know results when  they are in.  Follow up  Wed around 11 am or as needed  I did review results of CT small kidney stone  with Dr. Patsy Lager. We decided to rx flomax.   Since some l hydronephrosis presently can continue antibiotics. But may be stopped on Wednesday when he  follow up with Dr. Patsy Lager  Urine strainer rx given. May be helpful/beneficial. (notified pt)  Slaton Reaser, Ramon Dredge, PA-C

## 2017-03-29 ENCOUNTER — Emergency Department (HOSPITAL_COMMUNITY)
Admission: EM | Admit: 2017-03-29 | Discharge: 2017-03-29 | Disposition: A | Discharge status: Home / Self Care | Payer: BLUE CROSS/BLUE SHIELD | Attending: Emergency Medicine | Admitting: Emergency Medicine

## 2017-03-29 ENCOUNTER — Encounter (HOSPITAL_COMMUNITY): Payer: Self-pay | Admitting: *Deleted

## 2017-03-29 DIAGNOSIS — Z79899 Other long term (current) drug therapy: Secondary | ICD-10-CM | POA: Insufficient documentation

## 2017-03-29 DIAGNOSIS — Z7984 Long term (current) use of oral hypoglycemic drugs: Secondary | ICD-10-CM | POA: Insufficient documentation

## 2017-03-29 DIAGNOSIS — R7989 Other specified abnormal findings of blood chemistry: Secondary | ICD-10-CM | POA: Diagnosis not present

## 2017-03-29 DIAGNOSIS — E119 Type 2 diabetes mellitus without complications: Secondary | ICD-10-CM | POA: Insufficient documentation

## 2017-03-29 DIAGNOSIS — I10 Essential (primary) hypertension: Secondary | ICD-10-CM | POA: Insufficient documentation

## 2017-03-29 DIAGNOSIS — Z7982 Long term (current) use of aspirin: Secondary | ICD-10-CM | POA: Diagnosis not present

## 2017-03-29 DIAGNOSIS — R748 Abnormal levels of other serum enzymes: Secondary | ICD-10-CM | POA: Diagnosis not present

## 2017-03-29 DIAGNOSIS — N200 Calculus of kidney: Secondary | ICD-10-CM | POA: Insufficient documentation

## 2017-03-29 DIAGNOSIS — R109 Unspecified abdominal pain: Secondary | ICD-10-CM | POA: Diagnosis present

## 2017-03-29 HISTORY — DX: Calculus of kidney: N20.0

## 2017-03-29 LAB — CBC
HCT: 34.6 % — ABNORMAL LOW (ref 39.0–52.0)
Hemoglobin: 12.3 g/dL — ABNORMAL LOW (ref 13.0–17.0)
MCH: 33.6 pg (ref 26.0–34.0)
MCHC: 35.5 g/dL (ref 30.0–36.0)
MCV: 94.5 fL (ref 78.0–100.0)
Platelets: 222 10*3/uL (ref 150–400)
RBC: 3.66 MIL/uL — ABNORMAL LOW (ref 4.22–5.81)
RDW: 13.1 % (ref 11.5–15.5)
WBC: 12.1 10*3/uL — ABNORMAL HIGH (ref 4.0–10.5)

## 2017-03-29 LAB — COMPREHENSIVE METABOLIC PANEL
ALT: 18 U/L (ref 17–63)
AST: 22 U/L (ref 15–41)
Albumin: 4 g/dL (ref 3.5–5.0)
Alkaline Phosphatase: 59 U/L (ref 38–126)
Anion gap: 9 (ref 5–15)
BUN: 16 mg/dL (ref 6–20)
CO2: 24 mmol/L (ref 22–32)
Calcium: 8.9 mg/dL (ref 8.9–10.3)
Chloride: 103 mmol/L (ref 101–111)
Creatinine, Ser: 1.72 mg/dL — ABNORMAL HIGH (ref 0.61–1.24)
GFR calc Af Amer: 46 mL/min — ABNORMAL LOW (ref >60)
GFR calc non Af Amer: 40 mL/min — ABNORMAL LOW (ref >60)
Glucose, Bld: 186 mg/dL — ABNORMAL HIGH (ref 65–99)
Potassium: 4 mmol/L (ref 3.5–5.1)
Sodium: 136 mmol/L (ref 135–145)
Total Bilirubin: 1 mg/dL (ref 0.3–1.2)
Total Protein: 7.2 g/dL (ref 6.5–8.1)

## 2017-03-29 LAB — URINALYSIS, ROUTINE W REFLEX MICROSCOPIC
Bacteria, UA: NONE SEEN
Bilirubin Urine: NEGATIVE
Glucose, UA: NEGATIVE mg/dL
Ketones, ur: 80 mg/dL — AB
Nitrite: NEGATIVE
Protein, ur: NEGATIVE mg/dL
Specific Gravity, Urine: 1.02 (ref 1.005–1.030)
Squamous Epithelial / LPF: NONE SEEN
pH: 5 (ref 5.0–8.0)

## 2017-03-29 LAB — LIPASE, BLOOD: Lipase: 18 U/L (ref 11–51)

## 2017-03-29 MED ORDER — KETOROLAC TROMETHAMINE 30 MG/ML IJ SOLN
15.0000 mg | Freq: Once | INTRAMUSCULAR | Status: AC
  Administered 2017-03-29: 15 mg via INTRAMUSCULAR
  Filled 2017-03-29: qty 1

## 2017-03-29 MED ORDER — ONDANSETRON HCL 4 MG PO TABS: 4.0000 mg | ORAL_TABLET | Freq: Four times a day (QID) | ORAL | 0 refills | Status: DC

## 2017-03-29 NOTE — ED Triage Notes (Signed)
Pt describes left lower abdominal pain and left sided flank pain. Pt reports being seen for the same at PCP and told he has a kidney stone and given abx for possible diverticulitis. Pt reports his pain is unchanged by the pain medication denies nausea.

## 2017-03-29 NOTE — Discharge Instructions (Signed)
You need to drink more water.  Please try and drink 8 glasses of water per day.  You kidney function was elevated during your visit this wee in the ER and urgent care compared to October of last year.  This could be due to dehydration or as a result of your kidney stone.  You will need to follow-up with your doctor to have this rechecked within the week.  Please take Zofran for nausea so that you are able to drink and eat.

## 2017-03-29 NOTE — ED Provider Notes (Signed)
[facility]-EMERGENCY DEPT Provider Note   CSN: [id number] Arrival date & time: 03/29/17  2017  By signing my name below, I, Diona Browner, attest that this documentation has been prepared under the direction and in the presence of Roxy Horseman, PA-C.  Electronically Signed: Diona Browner, ED Scribe. 03/29/17. 10:52 PM.  History   Chief Complaint Chief Complaint  Patient presents with  . Flank Pain    HPI Comments: Chad Cox is a 66 y.o. male who presents to the Emergency Department with a chief complaint of intermittent, severe, left flank pain that started ~ 12:30 pm on 03/25/17. Associated sx include nausea and vomiting. Pt went to [facility] on 03/26/17, was given a Toradol shot and prescribed medication for a kidney stone and diverticulitis. On 03/28/17 pt went to his PCP and was dx with a 2 mm kidney stone. Around 3:30 am on 03/29/17, pt was woken up from the pain. He has been unable to take medication, besides hydrocodone, and eat because of fear of vomiting, but reports only one episode of vomiting. Pt has another appointment with his PCP for 03/30/17 at 11 am. He wants to get another shot of Toradol to hold him over until his appointment tomorrow morning. He has been taking hydrocodone with moderate relief. Pt denies fever.   The history is provided by the patient and medical records. No language interpreter was used.    Past Medical History:  Diagnosis Date  . Diabetes mellitus without complication (HCC)   . Hypertension   . Kidney stone     Patient Active Problem List   Diagnosis Date Noted  . Obesity, unspecified 04/29/2014  . Diabetes mellitus 01/30/2012  . Hypertension 01/30/2012  . Dyslipidemia 01/30/2012    History reviewed. No pertinent surgical history.     Home Medications    Prior to Admission medications   Medication Sig Start Date End Date Taking? Authorizing Provider  aspirin 81 MG tablet Take 81 mg by mouth daily.    [provider]    ciprofloxacin (CIPRO) 500 MG tablet Take 1 tablet (500 mg total) by mouth 2 (two) times daily. 03/26/17   Peyton Najjar, MD  cyclobenzaprine (FLEXERIL) 5 MG tablet Take 5 mg by mouth daily as needed for muscle pain. 07/15/16   [provider]  FLUoxetine (PROZAC) 20 MG tablet TAKE 1 TABLET BY MOUTH EVERY DAY. INCREASE TO 2 TABLETS EVERY DAY AFTER 2-3 WEEKS 01/12/17   Copland, Gwenlyn Found, MD  HYDROcodone-acetaminophen [facility]) 5-325 MG tablet Take 1 every 4-6 hours as needed for pain 03/26/17   Peyton Najjar, MD  HYDROcodone-acetaminophen (NORCO) 5-325 MG tablet Take 1 tablet by mouth every 6 (six) hours as needed for moderate pain. 03/28/17   Saguier, Ramon Dredge, PA-C  lisinopril (PRINIVIL,ZESTRIL) 20 MG tablet Take 1 tablet (20 mg total) by mouth daily. 04/01/16   Copland, Gwenlyn Found, MD  lisinopril (PRINIVIL,ZESTRIL) 20 MG tablet TAKE 1 TABLET(20 MG) BY MOUTH DAILY 11/29/16   Copland, Gwenlyn Found, MD  metFORMIN (GLUCOPHAGE) 500 MG tablet TAKE 1 TABLET(500 MG) BY MOUTH DAILY WITH BREAKFAST 02/03/17   Copland, Gwenlyn Found, MD  metroNIDAZOLE (FLAGYL) 500 MG tablet Take 1 tablet (500 mg total) by mouth 2 (two) times daily with a meal. DO NOT CONSUME ALCOHOL WHILE TAKING THIS MEDICATION. 03/26/17   Peyton Najjar, MD  Multiple Vitamin (MULTIVITAMIN) tablet Take 1 tablet by mouth daily.    [provider]  naproxen sodium (ANAPROX) 220 MG tablet Take 660 mg by mouth  2 (two) times daily as needed (pain).    [provider]  ondansetron (ZOFRAN ODT) 8 MG disintegrating tablet Take 1 tablet (8 mg total) by mouth every 8 (eight) hours as needed for nausea or vomiting. 03/28/17   Saguier, Ramon DredgeEdward, PA-C  pravastatin (PRAVACHOL) 40 MG tablet TAKE 1 TABLET(40 MG) BY MOUTH DAILY 04/01/16   Copland, Gwenlyn FoundJessica C, MD  tamsulosin (FLOMAX) 0.4 MG CAPS capsule Take 1 capsule (0.4 mg total) by mouth daily. 03/28/17   Saguier, Ramon DredgeEdward, PA-C    Family History Family History  Problem Relation Age of Onset  . Atrial  fibrillation Mother     Social History Social History  Substance Use Topics  . Smoking status: Never Smoker  . Smokeless tobacco: Never Used  . Alcohol use No     Allergies   Adhesive [tape] and Betadine [povidone iodine]   Review of Systems Review of Systems  Constitutional: Negative for fever.  Gastrointestinal: Positive for abdominal pain, nausea and vomiting.     Physical Exam Updated Vital Signs BP (!) 143/71   Pulse 80   Temp 99.1 F (37.3 C) (Oral)   Resp 16   Ht 6' (1.829 m)   Wt 240 lb (108.9 kg)   SpO2 95%   BMI 32.55 kg/m   Physical Exam  Constitutional: He is oriented to person, place, and time. He appears well-developed and well-nourished.  HENT:  Head: Normocephalic.  Eyes: EOM are normal.  Neck: Normal range of motion.  Cardiovascular: Normal rate, regular rhythm, normal heart sounds and intact distal pulses.  Exam reveals no gallop and no friction rub.   No murmur heard. Pulmonary/Chest: Effort normal and breath sounds normal. No respiratory distress. He has no wheezes. He has no rales. He exhibits no tenderness.  Abdominal: Soft. Bowel sounds are normal. He exhibits no distension. There is no tenderness.  Musculoskeletal: Normal range of motion.  Neurological: He is alert and oriented to person, place, and time.  Psychiatric: He has a normal mood and affect.  Nursing note and vitals reviewed.    ED Treatments / Results  DIAGNOSTIC STUDIES: Oxygen Saturation is 99% on RA, normal by my interpretation.   COORDINATION OF CARE: 10:52 PM-Discussed next steps with pt. Pt verbalized understanding and is agreeable with the plan.    Labs (all labs ordered are listed, but only abnormal results are displayed) Labs Reviewed  COMPREHENSIVE METABOLIC PANEL - Abnormal; Notable for the following:       Result Value   Glucose, Bld 186 (*)    Creatinine, Ser 1.72 (*)    GFR calc non Af Amer 40 (*)    GFR calc Af Amer 46 (*)    All other components  within normal limits  CBC - Abnormal; Notable for the following:    WBC 12.1 (*)    RBC 3.66 (*)    Hemoglobin 12.3 (*)    HCT 34.6 (*)    All other components within normal limits  URINALYSIS, ROUTINE W REFLEX MICROSCOPIC - Abnormal; Notable for the following:    Hgb urine dipstick SMALL (*)    Ketones, ur 80 (*)    Leukocytes, UA TRACE (*)    All other components within normal limits  LIPASE, BLOOD    EKG  EKG Interpretation None       Radiology Ct Renal Stone Study  Result Date: 03/28/2017 CLINICAL DATA:  66 year old diabetic hypertensive male with left-sided flank pain, left lower quadrant abdominal pain, nausea and slight hematuria since  03/25/2017. Initial encounter. EXAM: CT ABDOMEN AND PELVIS WITHOUT CONTRAST TECHNIQUE: Multidetector CT imaging of the abdomen and pelvis was performed following the standard protocol without IV contrast. COMPARISON:  None. FINDINGS: Lower chest: Minimal basal atelectasis. Coronary artery calcifications. Heart size top-normal. Hepatobiliary: Fatty top-normal size liver. Taking into account limitation by non contrast imaging, no worrisome hepatic lesion. No calcified gallstones. Pancreas: Taking into account limitation by non contrast imaging, no mass or inflammation. Spleen: Taking into account limitation by non contrast imaging, no mass. Top-normal size. Adrenals/Urinary Tract: Distal left ureteral 2 mm obstructing stone located 1 cm proximal to the left ureterovesical junction causing mild left hydroureteronephrosis. Tiny nonobstructing right renal calculi. Taking into account limitation by non contrast imaging, no adrenal or renal mass. Noncontrast filled under distended views of the urinary bladder without gross abnormality. Minimal impression upon the base by the prostate gland. Stomach/Bowel: Numerous colonic diverticula without inflammation. No osseous destructive leak There may be a small sliding-type hiatal hernia. Vascular/Lymphatic: Aortic  calcification and iliac artery calcification without aneurysm. No adenopathy. Shotty lymph node peripancreatic region. Reproductive: Prostate gland causes minimal impression upon the bladder base. Other: No free intraperitoneal air. Fat and vessels extend throughout the inguinal canal more notable on the right. No bowel containing hernia. Musculoskeletal: Degenerative changes lumbar spine with various degrees of spinal stenosis and foraminal narrowing. No osseous destructive lesion. IMPRESSION: Distal left ureteral 2 mm obstructing stone located 1 cm proximal to the left ureterovesical junction causing mild left hydroureteronephrosis. Tiny nonobstructing right renal calculi. Colonic diverticulosis Aortic atherosclerosis. Coronary artery calcifications. Top-normal size slightly fatty liver. Degenerative changes lumbar spine. Prostate gland causes minimal impression upon the bladder base. Electronically Signed   By: Lacy Duverney M.D.   On: 03/28/2017 10:05    Procedures Procedures (including critical care time)  Medications Ordered in ED Medications  ketorolac (TORADOL) 30 MG/ML injection 15 mg (not administered)     Initial Impression / Assessment and Plan / ED Course  I have reviewed the triage vital signs and the nursing notes.  Pertinent labs & imaging results that were available during my care of the patient were reviewed by me and considered in my medical decision making (see chart for details).     Patient with known renal stone.  He states that he has had difficulty Controlling his pain. He has taken 1 hydrocodone this evening.  He denies any fevers chills. He reports that he has been nauseated, but has only had one episode of vomiting. He states that he is fearful to eat and drink.  It is noted that the patient's creatinine is 1.72 and was 1.7 one day ago.  He has 80 ketones in his urine.  I have encouraged him to drink much more water and to have his creatinine rechecked later this week  by his PCP.  Patient states his pain is well controlled now.  No focal abdominal tenderness.  I doubt diverticulitis or pyelonephritis.  Recommend close follow-up.  Final Clinical Impressions(s) / ED Diagnoses   Final diagnoses:  Kidney stone  Elevated serum creatinine    New Prescriptions New Prescriptions   No medications on file   I personally performed the services described in this documentation, which was scribed in my presence. The recorded information has been reviewed and is accurate.       Roxy Horseman, PA-C 03/29/17 2359    Nira Conn, MD 03/30/17 [phone]

## 2017-03-30 ENCOUNTER — Encounter: Payer: Self-pay | Admitting: Family Medicine

## 2017-03-30 ENCOUNTER — Ambulatory Visit (INDEPENDENT_AMBULATORY_CARE_PROVIDER_SITE_OTHER): Payer: BLUE CROSS/BLUE SHIELD | Admitting: Family Medicine

## 2017-03-30 VITALS — BP 130/82 | HR 78 | Temp 98.3°F | Ht 72.0 in | Wt 240.2 lb

## 2017-03-30 DIAGNOSIS — R109 Unspecified abdominal pain: Secondary | ICD-10-CM | POA: Diagnosis not present

## 2017-03-30 DIAGNOSIS — N202 Calculus of kidney with calculus of ureter: Secondary | ICD-10-CM | POA: Diagnosis not present

## 2017-03-30 DIAGNOSIS — N200 Calculus of kidney: Secondary | ICD-10-CM

## 2017-03-30 DIAGNOSIS — Q6211 Congenital occlusion of ureteropelvic junction: Secondary | ICD-10-CM

## 2017-03-30 DIAGNOSIS — R7989 Other specified abnormal findings of blood chemistry: Secondary | ICD-10-CM | POA: Diagnosis not present

## 2017-03-30 DIAGNOSIS — I10 Essential (primary) hypertension: Secondary | ICD-10-CM | POA: Diagnosis not present

## 2017-03-30 DIAGNOSIS — E119 Type 2 diabetes mellitus without complications: Secondary | ICD-10-CM | POA: Diagnosis not present

## 2017-03-30 MED ORDER — CYCLOBENZAPRINE HCL 5 MG PO TABS: 5.0000 mg | ORAL_TABLET | Freq: Every day | ORAL | 1 refills | Status: DC | PRN

## 2017-03-30 MED ORDER — NAPROXEN SODIUM 220 MG PO TABS: 440.0000 mg | ORAL_TABLET | Freq: Two times a day (BID) | ORAL | 2 refills | Status: DC | PRN

## 2017-03-30 MED ORDER — NAPROXEN SODIUM 220 MG PO TABS: 660.0000 mg | ORAL_TABLET | Freq: Two times a day (BID) | ORAL | 2 refills | Status: DC | PRN

## 2017-03-30 NOTE — Patient Instructions (Addendum)
Please come in for labs and ultrasound next week- we will arrange this for you Continue to use the hydrocodone as needed for pain.  Also use the flomax once a day until we feel that the stone has passed.  Strain your urine for the next few days and let me know if you catch a stone  Your stone was quite small so I am hoping that it will pass on it's own- however if you are not continuing to feel better over the next few days please let me know!   You can also use naproxen as needed for pain Use flexeril as needed for occasional back pain ,but avoid using this with the hydrocodone

## 2017-03-30 NOTE — Progress Notes (Signed)
[place] at [facility] [address], [place], [place] [address] [phone]  Date:  03/30/2017   Name:  Chad Cox   DOB:  1951/03/06   MRN:  [id number]  PCP:  Darreld Mclean, MD    Chief Complaint: Follow-up (Pt here to f/u on left sided pain from kidney stone. Request refills on naproxen and flexeril. )   History of Present Illness:  Chad Cox is a 66 y.o. very pleasant male patient who presents with the following:   Here today to follow-up on a kidney stone- he was seen on 5/19 and again on 5/21 with flank/ abd pain.    He saw Percell Miller on 5/21- got toradol IM and a CT scan.  See results below He had to go to the ER yesterday due to more severe pain- yesterday am he awoke around 0400 with more severe pain.  He took his hydrocodone but still had a lot of pain.  He also tried heat.  However the pain continued so he ended up in the ER for a shot of toradol.    He had labs yesterday at the ER as below Admission on 03/29/2017, Discharged on 03/29/2017  Component Date Value Ref Range Status  . Lipase 03/29/2017 18  11 - 51 U/L Final  . Sodium 03/29/2017 136  135 - 145 mmol/L Final  . Potassium 03/29/2017 4.0  3.5 - 5.1 mmol/L Final  . Chloride 03/29/2017 103  101 - 111 mmol/L Final  . CO2 03/29/2017 24  22 - 32 mmol/L Final  . Glucose, Bld 03/29/2017 186* 65 - 99 mg/dL Final  . BUN 03/29/2017 16  6 - 20 mg/dL Final  . Creatinine, Ser 03/29/2017 1.72* 0.61 - 1.24 mg/dL Final  . Calcium 03/29/2017 8.9  8.9 - 10.3 mg/dL Final  . Total Protein 03/29/2017 7.2  6.5 - 8.1 g/dL Final  . Albumin 03/29/2017 4.0  3.5 - 5.0 g/dL Final  . AST 03/29/2017 22  15 - 41 U/L Final  . ALT 03/29/2017 18  17 - 63 U/L Final  . Alkaline Phosphatase 03/29/2017 59  38 - 126 U/L Final  . Total Bilirubin 03/29/2017 1.0  0.3 - 1.2 mg/dL Final  . GFR calc non Af Amer 03/29/2017 40* >60 mL/min Final  . GFR calc Af Amer 03/29/2017 46* >60 mL/min  Final   Comment: (NOTE) The eGFR has been calculated using the CKD EPI equation. This calculation has not been validated in all clinical situations. eGFR's persistently <60 mL/min signify possible Chronic Kidney Disease.   . Anion gap 03/29/2017 9  5 - 15 Final  . WBC 03/29/2017 12.1* 4.0 - 10.5 K/uL Final  . RBC 03/29/2017 3.66* 4.22 - 5.81 MIL/uL Final  . Hemoglobin 03/29/2017 12.3* 13.0 - 17.0 g/dL Final  . HCT 03/29/2017 34.6* 39.0 - 52.0 % Final  . MCV 03/29/2017 94.5  78.0 - 100.0 fL Final  . MCH 03/29/2017 33.6  26.0 - 34.0 pg Final  . MCHC 03/29/2017 35.5  30.0 - 36.0 g/dL Final  . RDW 03/29/2017 13.1  11.5 - 15.5 % Final  . Platelets 03/29/2017 222  150 - 400 K/uL Final  . Color, Urine 03/29/2017 YELLOW  YELLOW Final  . APPearance 03/29/2017 CLEAR  CLEAR Final  . Specific Gravity, Urine 03/29/2017 1.020  1.005 - 1.030 Final  . pH 03/29/2017 5.0  5.0 - 8.0 Final  . Glucose, UA 03/29/2017 NEGATIVE  NEGATIVE mg/dL Final  .  Hgb urine dipstick 03/29/2017 SMALL* NEGATIVE Final  . Bilirubin Urine 03/29/2017 NEGATIVE  NEGATIVE Final  . Ketones, ur 03/29/2017 80* NEGATIVE mg/dL Final  . Protein, ur 03/29/2017 NEGATIVE  NEGATIVE mg/dL Final  . Nitrite 03/29/2017 NEGATIVE  NEGATIVE Final  . Leukocytes, UA 03/29/2017 TRACE* NEGATIVE Final  . RBC / HPF 03/29/2017 6-30  0 - 5 RBC/hpf Final  . WBC, UA 03/29/2017 6-30  0 - 5 WBC/hpf Final  . Bacteria, UA 03/29/2017 NONE SEEN  NONE SEEN Final  . Squamous Epithelial / LPF 03/29/2017 NONE SEEN  NONE SEEN Final  . Mucous 03/29/2017 PRESENT   Final   CT scan on 5/21-  IMPRESSION: Distal left ureteral 2 mm obstructing stone located 1 cm proximal to the left ureterovesical junction causing mild left hydroureteronephrosis. Tiny nonobstructing right renal calculi.  He has not yet picked up his hydrocodone or flomax from [place] but will do so today  He has had kidney stones in the past but never needed any procedure to remove them  Today  he is overall feeling much better- he has a little pain but not nearly as severe.  He vomited yesterday but has not vomited today.  He is able to eat some today and is trying to drink more liquids.  No fever  He has had more trouble with his weight since his mom passed away- he tends to eat convenience foods too often instead of cooking.  He will try to do better  BP Readings from Last 3 Encounters:  03/30/17 130/82  03/29/17 136/70  03/28/17 124/74   Wt Readings from Last 3 Encounters:  03/30/17 240 lb 3.2 oz (109 kg)  03/29/17 240 lb (108.9 kg)  03/28/17 240 lb (108.9 kg)      Patient Active Problem List   Diagnosis Date Noted  . Obesity, unspecified 04/29/2014  . Diabetes mellitus 01/30/2012  . Hypertension 01/30/2012  . Dyslipidemia 01/30/2012    Past Medical History:  Diagnosis Date  . Diabetes mellitus without complication ([place])   . Hypertension   . Kidney stone     No past surgical history on file.  Social History  Substance Use Topics  . Smoking status: Never Smoker  . Smokeless tobacco: Never Used  . Alcohol use No    Family History  Problem Relation Age of Onset  . Atrial fibrillation Mother     Allergies  Allergen Reactions  . Adhesive [Tape]     "acts like poison ivy" blisters up, itching  . Betadine [Povidone Iodine] Other (See Comments)    Blisters, itching    Medication list has been reviewed and updated.  Current Outpatient Prescriptions on File Prior to Visit  Medication Sig Dispense Refill  . aspirin 81 MG tablet Take 81 mg by mouth daily.    . ciprofloxacin (CIPRO) 500 MG tablet Take 1 tablet (500 mg total) by mouth 2 (two) times daily. 14 tablet 0  . HYDROcodone-acetaminophen (NORCO) 5-325 MG tablet Take 1 tablet by mouth every 6 (six) hours as needed for moderate pain. 20 tablet 0  . lisinopril (PRINIVIL,ZESTRIL) 20 MG tablet Take 1 tablet (20 mg total) by mouth daily. 90 tablet 3  . metFORMIN (GLUCOPHAGE) 500 MG tablet TAKE 1  TABLET(500 MG) BY MOUTH DAILY WITH BREAKFAST 30 tablet 0  . Multiple Vitamin (MULTIVITAMIN) tablet Take 1 tablet by mouth daily.    . naproxen sodium (ANAPROX) 220 MG tablet Take 660 mg by mouth 2 (two) times daily as needed (pain).    Marland Kitchen  ondansetron (ZOFRAN ODT) 8 MG disintegrating tablet Take 1 tablet (8 mg total) by mouth every 8 (eight) hours as needed for nausea or vomiting. 20 tablet 0  . pravastatin (PRAVACHOL) 40 MG tablet TAKE 1 TABLET(40 MG) BY MOUTH DAILY 90 tablet 3  . metroNIDAZOLE (FLAGYL) 500 MG tablet Take 1 tablet (500 mg total) by mouth 2 (two) times daily with a meal. DO NOT CONSUME ALCOHOL WHILE TAKING THIS MEDICATION. (Patient not taking: Reported on 03/30/2017) 14 tablet 0  . tamsulosin (FLOMAX) 0.4 MG CAPS capsule Take 1 capsule (0.4 mg total) by mouth daily. (Patient not taking: Reported on 03/30/2017) 30 capsule 0   No current facility-administered medications on file prior to visit.     Review of Systems:  As per HPI- otherwise negative.   Physical Examination: Vitals:   03/30/17 1054  BP: 130/82  Pulse: 78  Temp: 98.3 F (36.8 C)   Vitals:   03/30/17 1054  Weight: 240 lb 3.2 oz (109 kg)  Height: 6' (1.829 m)   Body mass index is 32.58 kg/m. Ideal Body Weight: Weight in (lb) to have BMI = 25: 183.9  GEN: WDWN, NAD, Non-toxic, A & O x 3, overweight, looks well today HEENT: Atraumatic, Normocephalic. Neck supple. No masses, No LAD. Ears and Nose: No external deformity. CV: RRR, No M/G/R. No JVD. No thrill. No extra heart sounds. PULM: CTA B, no wheezes, crackles, rhonchi. No retractions. No resp. distress. No accessory muscle use. ABD: S, NT, ND, +BS. No rebound. No HSM. EXTR: No c/c/e NEURO Normal gait.  PSYCH: Normally interactive. Conversant. Not depressed or anxious appearing.  Calm demeanor.  Mild left flank tenderness  Belly is benign   Assessment and Plan: Nephrolithiasis - Plan: cyclobenzaprine (FLEXERIL) 5 MG tablet, naproxen sodium  (ANAPROX) 220 MG tablet, DISCONTINUED: naproxen sodium (ANAPROX) 220 MG tablet  Hydronephrosis with ureteropelvic junction (UPJ) obstruction - Plan: Basic metabolic panel, CBC  Elevated serum creatinine - Plan: Basic metabolic panel, CBC  Here today to follow-up on a kidney stone He is feeling better today and we hope that the stone has passed into his bladder He will continue pain medications as needed and use flomax daily Plan to repeat his labs next week to make sure CBC and creat are back to normal  Will also US kidneys to check for resolution of hydronephrosis   He will alert me if not continuing to do better over the next few days   Signed Lamar Blinks, MD

## 2017-03-31 ENCOUNTER — Encounter: Payer: Self-pay | Admitting: Family Medicine

## 2017-03-31 ENCOUNTER — Ambulatory Visit (INDEPENDENT_AMBULATORY_CARE_PROVIDER_SITE_OTHER): Payer: BLUE CROSS/BLUE SHIELD | Admitting: Family Medicine

## 2017-03-31 ENCOUNTER — Telehealth: Payer: Self-pay | Admitting: Family Medicine

## 2017-03-31 VITALS — BP 122/62 | HR 93 | Temp 99.0°F

## 2017-03-31 DIAGNOSIS — N289 Disorder of kidney and ureter, unspecified: Secondary | ICD-10-CM | POA: Diagnosis not present

## 2017-03-31 DIAGNOSIS — N23 Unspecified renal colic: Secondary | ICD-10-CM | POA: Diagnosis not present

## 2017-03-31 LAB — URINE CULTURE: Culture: NO GROWTH

## 2017-03-31 MED ORDER — KETOROLAC TROMETHAMINE 30 MG/ML IJ SOLN
30.0000 mg | Freq: Once | INTRAMUSCULAR | Status: AC
  Administered 2017-03-31: 30 mg via INTRAMUSCULAR

## 2017-03-31 NOTE — Telephone Encounter (Signed)
[facility] - Client TELEPHONE ADVICE RECORD [facility]  Patient Name: Chad Cox  DOB: [birth year]    Initial Comment Caller has a kidney stone. Needs something for pain. See's Dr. Dallas Sch[place].    Nurse Assessment  Nurse: Josie SaundersGerard, RN, Erskine SquibbJane Date/Time Lamount Cohen(Eastern Time): 03/31/2017 4:25:52 PM  Confirm and document reason for call. If symptomatic, describe symptoms. ---Caller has a kidney stone. Needs something for pain  Does the patient have any new or worsening symptoms? ---Yes  Will a triage be completed? ---Yes  Related visit to physician within the last 2 weeks? ---Yes  Does the PT have any chronic conditions? (i.e. diabetes, asthma, etc.) ---Yes  List chronic conditions. ---Decreased renlaFx, hx of Kidney stones  Is this a behavioral health or substance abuse call? ---No     Guidelines    Guideline Title Affirmed Question Affirmed Notes  Flank Pain [1] SEVERE pain (e.g., excruciating, scale 8-10) AND [2] present > 1 hour    Final Disposition User   Go to ED Now Josie SaundersGerard, RN, Jane    Comments  Has hyertension, diabetes and High Cholesterol.  PATIENT WOULD LIKE A CALL BACK CURRENTLY DOES NOT HAVE RIDE TO HOSPITAL   Disagree/Comply: Disagree  Disagree/Comply Reason: Unable to find transportation

## 2017-03-31 NOTE — Patient Instructions (Signed)
You got another shot of toradol today I am going to try and get you seen by urology tomorrow.  If you are NOT able to be seen by urology tomorrow please come in clinic for a kidney panel- we can do this as a lab visit only Continue to screen your urine and I hope that you are feeling better soon!  Please contact me if you need anything at all

## 2017-03-31 NOTE — Telephone Encounter (Signed)
Caller name: Relationship to patient: Self Can be reached: [phone] Pharmacy:  Reason for call: Patient request call back from provider because he is still in pain

## 2017-03-31 NOTE — Progress Notes (Signed)
[facility] [address], [address] [place], [place] [address] [phone] [phone]  Date:  03/31/2017   Name:  Chad Cox   DOB:  23-Jun-1951   MRN:  [id number]  PCP:  Pearline Cablesopland, Alonzo Owczarzak C, MD    Chief Complaint: Follow-up (Pt here for f/u visit on kidney stone. )   History of Present Illness:  Chad Cox is a 66 y.o. very pleasant male patient who presents with the following:  Seen yesterday to follow-up kidney stone pain He was feeling better yesterday in clinic- however he then has another flare of pain last night [place]  His pain got better while he was waiting in the ER so they did not do a shot of toradol or labs.  However pain got worse again today so he called us- I asked him to come in this evening.  In the interim he did take 2 hydrocodone so he is feeling better now.  However his pain may certainly come back tonight so would like to give him toradol to try and keep him out of the ER  His last dose of toradol was given in the ER - 15 mg on 5/22, previously recieved 60 on 5/21, and 30 on 5/19 Dose was decreased due to decreased creat-  Discussed appropriate dosage with pharm D.  Will give him 30 mg today  He did take a tylenol pm last night- then took his flomax and 2 norco today He is not taking any other NSAIDs - he does have a naproxen rx but is not using this  He has noted a temp of 99, nothing higher.  No hematuria.  He is eating and drinking fine except when he is in a lot of pain    Chemistry      Component Value Date/Time   NA 136 03/29/2017 2103   K 4.0 03/29/2017 2103   CL 103 03/29/2017 2103   CO2 24 03/29/2017 2103   BUN 16 03/29/2017 2103   CREATININE 1.72 (H) 03/29/2017 2103   CREATININE 0.86 03/10/2015 0911      Component Value Date/Time   CALCIUM 8.9 03/29/2017 2103   ALKPHOS 59 03/29/2017 2103   AST 22 03/29/2017 2103   ALT 18 03/29/2017 2103   BILITOT 1.0 03/29/2017  2103       Patient Active Problem List   Diagnosis Date Noted  . Obesity, unspecified 04/29/2014  . Diabetes mellitus 01/30/2012  . Hypertension 01/30/2012  . Dyslipidemia 01/30/2012    Past Medical History:  Diagnosis Date  . Diabetes mellitus without complication (HCC)   . Hypertension   . Kidney stone     No past surgical history on file.  Social History  Substance Use Topics  . Smoking status: Never Smoker  . Smokeless tobacco: Never Used  . Alcohol use No    Family History  Problem Relation Age of Onset  . Atrial fibrillation Mother     Allergies  Allergen Reactions  . Adhesive [Tape]     "acts like poison ivy" blisters up, itching  . Betadine [Povidone Iodine] Other (See Comments)    Blisters, itching    Medication list has been reviewed and updated.  Current Outpatient Prescriptions on File Prior to Visit  Medication Sig Dispense Refill  . aspirin 81 MG tablet Take 81 mg by mouth daily.    . ciprofloxacin (CIPRO) 500 MG tablet Take 1 tablet (500 mg total)  by mouth 2 (two) times daily. 14 tablet 0  . cyclobenzaprine (FLEXERIL) 5 MG tablet Take 1 tablet (5 mg total) by mouth daily as needed. 30 tablet 1  . HYDROcodone-acetaminophen (NORCO) 5-325 MG tablet Take 1 tablet by mouth every 6 (six) hours as needed for moderate pain. 20 tablet 0  . lisinopril (PRINIVIL,ZESTRIL) 20 MG tablet Take 1 tablet (20 mg total) by mouth daily. 90 tablet 3  . metFORMIN (GLUCOPHAGE) 500 MG tablet TAKE 1 TABLET(500 MG) BY MOUTH DAILY WITH BREAKFAST 30 tablet 0  . metroNIDAZOLE (FLAGYL) 500 MG tablet Take 1 tablet (500 mg total) by mouth 2 (two) times daily with a meal. DO NOT CONSUME ALCOHOL WHILE TAKING THIS MEDICATION. (Patient not taking: Reported on 03/30/2017) 14 tablet 0  . Multiple Vitamin (MULTIVITAMIN) tablet Take 1 tablet by mouth daily.    . naproxen sodium (ANAPROX) 220 MG tablet Take 2 tablets (440 mg total) by mouth 2 (two) times daily as needed (pain). 60 tablet 2   . ondansetron (ZOFRAN ODT) 8 MG disintegrating tablet Take 1 tablet (8 mg total) by mouth every 8 (eight) hours as needed for nausea or vomiting. 20 tablet 0  . pravastatin (PRAVACHOL) 40 MG tablet TAKE 1 TABLET(40 MG) BY MOUTH DAILY 90 tablet 3  . tamsulosin (FLOMAX) 0.4 MG CAPS capsule Take 1 capsule (0.4 mg total) by mouth daily. (Patient not taking: Reported on 03/30/2017) 30 capsule 0   No current facility-administered medications on file prior to visit.     Review of Systems:  As per HPI- otherwise negative.   Physical Examination: Vitals:   03/31/17 1740  BP: 122/62  Pulse: 93  Temp: 99 F (37.2 C)   There were no vitals filed for this visit. There is no height or weight on file to calculate BMI. Ideal Body Weight:    GEN: WDWN, NAD, Non-toxic, A & O x 3- currently not in severe pain and looks well, comfortable.   He did not drive here today since he took 2 vicodin HEENT: Atraumatic, Normocephalic. Neck supple. No masses, No LAD. Ears and Nose: No external deformity. CV: RRR, No M/G/R. No JVD. No thrill. No extra heart sounds. PULM: CTA B, no wheezes, crackles, rhonchi. No retractions. No resp. distress. No accessory muscle use. ABD: S, NT, ND EXTR: No c/c/e NEURO Normal gait.  PSYCH: Normally interactive. Conversant. Not depressed or anxious appearing.  Calm demeanor.    Assessment and Plan: Renal colic - Plan: ketorolac (TORADOL) 30 MG/ML injection 30 mg, Ambulatory referral to Urology  Acute renal insufficiency - Plan: Basic metabolic panel  IM toradol for renal colic- he had another attack this afternoon and we are hoping he will be able to rest tonight. He has had several clinic and ER visits, and we are approaching a long weekend.  Will try to get an urgent urology consultation tomorrow If he is NOT able to see urology he will come in for a BMP tomorrow to monitor his renal function Per recent CT his stone is tiny, but it seems to be quite difficult to  pass Continue daily flomax  Signed Abbe Amsterdam, MD

## 2017-03-31 NOTE — Telephone Encounter (Signed)
Called him back- [facility]- they did not give him toradol His pain is back again- he would like to have a shot if we can This is fine- he will come in today and I will see him and give him a shot

## 2017-03-31 NOTE — Telephone Encounter (Signed)
Called but did not reach him, cannot LM Will try back later

## 2017-03-31 NOTE — Telephone Encounter (Signed)
Patient called back stating he is having severe pain. Patient sounded in distress. Transferred to [facility]

## 2017-04-01 ENCOUNTER — Ambulatory Visit: Payer: BLUE CROSS/BLUE SHIELD | Admitting: Family Medicine

## 2017-04-01 ENCOUNTER — Other Ambulatory Visit (INDEPENDENT_AMBULATORY_CARE_PROVIDER_SITE_OTHER): Payer: BLUE CROSS/BLUE SHIELD

## 2017-04-01 DIAGNOSIS — N289 Disorder of kidney and ureter, unspecified: Secondary | ICD-10-CM

## 2017-04-01 DIAGNOSIS — R7989 Other specified abnormal findings of blood chemistry: Secondary | ICD-10-CM | POA: Diagnosis not present

## 2017-04-01 DIAGNOSIS — Q6211 Congenital occlusion of ureteropelvic junction: Secondary | ICD-10-CM | POA: Diagnosis not present

## 2017-04-01 LAB — CBC
HCT: 33.7 % — ABNORMAL LOW (ref 39.0–52.0)
Hemoglobin: 12.1 g/dL — ABNORMAL LOW (ref 13.0–17.0)
MCHC: 35.8 g/dL (ref 30.0–36.0)
MCV: 95.9 fl (ref 78.0–100.0)
Platelets: 259 10*3/uL (ref 150.0–400.0)
RBC: 3.51 Mil/uL — ABNORMAL LOW (ref 4.22–5.81)
RDW: 13.1 % (ref 11.5–15.5)
WBC: 7.9 10*3/uL (ref 4.0–10.5)

## 2017-04-01 LAB — BASIC METABOLIC PANEL
BUN: 12 mg/dL (ref 6–23)
CO2: 29 mEq/L (ref 19–32)
Calcium: 9.3 mg/dL (ref 8.4–10.5)
Chloride: 105 mEq/L (ref 96–112)
Creatinine, Ser: 1.07 mg/dL (ref 0.40–1.50)
GFR: 73.64 mL/min (ref >60.00)
Glucose, Bld: 241 mg/dL — ABNORMAL HIGH (ref 70–99)
Potassium: 4.1 mEq/L (ref 3.5–5.1)
Sodium: 140 mEq/L (ref 135–145)

## 2017-04-02 ENCOUNTER — Other Ambulatory Visit: Payer: Self-pay | Admitting: Family Medicine

## 2017-04-02 DIAGNOSIS — E785 Hyperlipidemia, unspecified: Secondary | ICD-10-CM

## 2017-04-03 ENCOUNTER — Encounter: Payer: Self-pay | Admitting: Family Medicine

## 2017-04-05 ENCOUNTER — Other Ambulatory Visit: Payer: Self-pay | Admitting: Emergency Medicine

## 2017-04-05 DIAGNOSIS — E785 Hyperlipidemia, unspecified: Secondary | ICD-10-CM

## 2017-04-05 MED ORDER — PRAVASTATIN SODIUM 40 MG PO TABS: ORAL_TABLET | ORAL | 0 refills | Status: DC

## 2017-04-07 ENCOUNTER — Other Ambulatory Visit: Payer: Self-pay | Admitting: Family Medicine

## 2017-04-07 ENCOUNTER — Encounter: Payer: Self-pay | Admitting: Family Medicine

## 2017-04-07 ENCOUNTER — Ambulatory Visit (HOSPITAL_BASED_OUTPATIENT_CLINIC_OR_DEPARTMENT_OTHER)
Admission: RE | Admit: 2017-04-07 | Discharge: 2017-04-07 | Disposition: A | Discharge status: Home / Self Care | Payer: BLUE CROSS/BLUE SHIELD | Source: Ambulatory Visit | Attending: Family Medicine | Admitting: Family Medicine

## 2017-04-07 DIAGNOSIS — N201 Calculus of ureter: Secondary | ICD-10-CM | POA: Diagnosis not present

## 2017-04-07 DIAGNOSIS — N200 Calculus of kidney: Secondary | ICD-10-CM | POA: Insufficient documentation

## 2017-04-22 ENCOUNTER — Encounter: Payer: Self-pay | Admitting: Family Medicine

## 2017-04-26 ENCOUNTER — Other Ambulatory Visit: Payer: Self-pay | Admitting: Medical

## 2017-05-15 ENCOUNTER — Other Ambulatory Visit: Payer: Self-pay | Admitting: Family Medicine

## 2017-05-15 DIAGNOSIS — E785 Hyperlipidemia, unspecified: Secondary | ICD-10-CM

## 2017-05-15 NOTE — Progress Notes (Addendum)
[facility] [address] [place], [place] [address] [phone] [phone]  Date:  05/16/2017   Name:  Chad Rainwaterimothy Spraker   DOB:  1951/07/21   MRN:  [id number]  PCP:  Pearline Cablesopland, Jessica C, MD    Chief Complaint: Medication Refill (Pt here for med refills. )   History of Present Illness:  Chad Cox is a 66 y.o. very pleasant male patient who presents with the following:  History of obesity, HTN, DM, kidney stones and dyslipidemia.  Here today for his CPE Earlier this year he had a lot of trouble with kidney stones and required acute care several times for severe pain.  He had a CT in May:  IMPRESSION: Distal left ureteral 2 mm obstructing stone located 1 cm proximal to the left ureterovesical junction causing mild left hydroureteronephrosis.  Tiny nonobstructing right renal calculi. Colonic diverticulosis Aortic atherosclerosis. Coronary artery calcifications. Top-normal size slightly fatty liver. Degenerative changes lumbar spine. Prostate gland causes minimal impression upon the bladder base.  We had planned to have him see urology - he did not end up going however since he seems to have passed his stone at home.   He is passing his urine normally now- no further issues since he passed the stone at the end of May.    Lab Results  Component Value Date   HGBA1C 7.0 (H) 05/16/2017   Needs foot exam, eye exam, prevnar- he declines prevnar today however He will go for his eye exam soon Due for PSA today as well He is fasting today for labs   He continues to do a lot of work with a [employer] which he really enjoys.   Colonoscopy 2013  He has noted a cough for the last 2-3 weeks in the am- and some congestion No fever or chills He would like to go back on his prozac- he felt like he was better when he was taking this and would like to go back on. He stopped taking it 2-3 months ago .  He had been up to 40 mg when  he was taking it earlier this year  He is thinking about starting nutrisystem to lose weight- thinks he would do well with his meals being delivered as he does not really cook  Lab Results  Component Value Date   HGBA1C 7.0 (H) 05/16/2017   Lab Results  Component Value Date   PSA 3.11 05/16/2017   PSA 1.63 04/01/2016   PSA 2.47 03/10/2015     Patient Active Problem List   Diagnosis Date Noted  . Obesity, unspecified 04/29/2014  . Diabetes mellitus 01/30/2012  . Hypertension 01/30/2012  . Dyslipidemia 01/30/2012    Past Medical History:  Diagnosis Date  . Diabetes mellitus without complication (HCC)   . Hypertension   . Kidney stone     No past surgical history on file.  Social History  Substance Use Topics  . Smoking status: Never Smoker  . Smokeless tobacco: Never Used  . Alcohol use No    Family History  Problem Relation Age of Onset  . Atrial fibrillation Mother     Allergies  Allergen Reactions  . Adhesive [Tape]     "acts like poison ivy" blisters up, itching  . Betadine [Povidone Iodine] Other (See Comments)    Blisters, itching    Medication list has been reviewed and updated.  Current Outpatient Prescriptions on File Prior to Visit  Medication Sig Dispense  Refill  . aspirin 81 MG tablet Take 81 mg by mouth daily.    . cyclobenzaprine (FLEXERIL) 5 MG tablet Take 1 tablet (5 mg total) by mouth daily as needed. 30 tablet 1  . HYDROcodone-acetaminophen (NORCO) 5-325 MG tablet Take 1 tablet by mouth every 6 (six) hours as needed for moderate pain. 20 tablet 0  . Multiple Vitamin (MULTIVITAMIN) tablet Take 1 tablet by mouth daily.    . naproxen sodium (ANAPROX) 220 MG tablet Take 2 tablets (440 mg total) by mouth 2 (two) times daily as needed (pain). 60 tablet 2  . tamsulosin (FLOMAX) 0.4 MG CAPS capsule TAKE 1 CAPSULE(0.4 MG) BY MOUTH DAILY 30 capsule 0  . ondansetron (ZOFRAN ODT) 8 MG disintegrating tablet Take 1 tablet (8 mg total) by mouth every 8  (eight) hours as needed for nausea or vomiting. (Patient not taking: Reported on 05/16/2017) 20 tablet 0   No current facility-administered medications on file prior to visit.     Review of Systems:  As per HPI- otherwise negative.   Physical Examination: Vitals:   05/16/17 0911  BP: 123/62  Pulse: 73  Temp: 98 F (36.7 C)   Vitals:   05/16/17 0911  Weight: 241 lb 6.4 oz (109.5 kg)  Height: 6' (1.829 m)   Body mass index is 32.74 kg/m. Ideal Body Weight: Weight in (lb) to have BMI = 25: 183.9  GEN: WDWN, NAD, Non-toxic, A & O x 3, obese, looks well HEENT: Atraumatic, Normocephalic. Neck supple. No masses, No LAD.  Bilateral TM wnl, oropharynx normal.  PEERL,EOMI.   Ears and Nose: No external deformity. CV: RRR, No M/G/R. No JVD. No thrill. No extra heart sounds. PULM: CTA B, no wheezes, crackles, rhonchi. No retractions. No resp. distress. No accessory muscle use. ABD: S, NT, ND, +BS. No rebound. No HSM. EXTR: No c/c/e NEURO Normal gait.  PSYCH: Normally interactive. Conversant. Not depressed or anxious appearing.  Calm demeanor.  Foot exam normal today  Assessment and Plan: Physical exam  Nephrolithiasis  Essential hypertension - Plan: Comprehensive metabolic panel, lisinopril (PRINIVIL,ZESTRIL) 20 MG tablet  Mixed hyperlipidemia - Plan: Lipid panel, pravastatin (PRAVACHOL) 40 MG tablet  Pre-diabetes - Plan: Comprehensive metabolic panel, Hemoglobin A1c, metFORMIN (GLUCOPHAGE) 500 MG tablet  Screening for prostate cancer - Plan: PSA  Medication monitoring encounter - Plan: CBC, Comprehensive metabolic panel  Depression, major, single episode, mild (HCC) - Plan: FLUoxetine (PROZAC) 20 MG tablet  Overweight  Here today for a CPE Looks well, feeling well, no further issues with kidney stones Denies any SI but felt like his mood was better while on prozac- will restart this for him today   Will plan further follow- up pending labs. Encouraged his plans to lose  weight  Signed Abbe Amsterdam, MD  Received his labs  Results for orders placed or performed in visit on 05/16/17  CBC  Result Value Ref Range   WBC 6.6 4.0 - 10.5 K/uL   RBC 3.76 (L) 4.22 - 5.81 Mil/uL   Platelets 229.0 150.0 - 400.0 K/uL   Hemoglobin 13.0 13.0 - 17.0 g/dL   HCT 40.9 (L) 81.1 - 91.4 %   MCV 95.9 78.0 - 100.0 fl   MCHC 35.9 30.0 - 36.0 g/dL   RDW 78.2 95.6 - 21.3 %  Comprehensive metabolic panel  Result Value Ref Range   Sodium 139 135 - 145 mEq/L   Potassium 3.9 3.5 - 5.1 mEq/L   Chloride 105 96 - 112 mEq/L   CO2  24 19 - 32 mEq/L   Glucose, Bld 210 (H) 70 - 99 mg/dL   BUN 18 6 - 23 mg/dL   Creatinine, Ser 1.61 0.40 - 1.50 mg/dL   Total Bilirubin 0.6 0.2 - 1.2 mg/dL   Alkaline Phosphatase 63 39 - 117 U/L   AST 22 0 - 37 U/L   ALT 23 0 - 53 U/L   Total Protein 6.6 6.0 - 8.3 g/dL   Albumin 4.2 3.5 - 5.2 g/dL   Calcium 9.1 8.4 - 09.6 mg/dL   GFR 04.54 >09.81 mL/min  Hemoglobin A1c  Result Value Ref Range   Hgb A1c MFr Bld 7.0 (H) 4.6 - 6.5 %  PSA  Result Value Ref Range   PSA 3.11 0.10 - 4.00 ng/mL  Lipid panel  Result Value Ref Range   Cholesterol 155 0 - 200 mg/dL   Triglycerides 19.1 0.0 - 149.0 mg/dL   HDL 47.82 >95.62 mg/dL   VLDL 13.0 0.0 - 86.5 mg/dL   LDL Cholesterol 95 0 - 99 mg/dL   Total CHOL/HDL Ratio 4    NonHDL 115.09     Lab Results  Component Value Date   PSA 3.11 05/16/2017   PSA 1.63 04/01/2016   PSA 2.47 03/10/2015

## 2017-05-16 ENCOUNTER — Encounter: Payer: Self-pay | Admitting: Family Medicine

## 2017-05-16 ENCOUNTER — Ambulatory Visit (INDEPENDENT_AMBULATORY_CARE_PROVIDER_SITE_OTHER): Payer: BLUE CROSS/BLUE SHIELD | Admitting: Family Medicine

## 2017-05-16 VITALS — BP 123/62 | HR 73 | Temp 98.0°F | Ht 72.0 in | Wt 241.4 lb

## 2017-05-16 DIAGNOSIS — Z125 Encounter for screening for malignant neoplasm of prostate: Secondary | ICD-10-CM | POA: Diagnosis not present

## 2017-05-16 DIAGNOSIS — F320 Major depressive disorder, single episode, mild: Secondary | ICD-10-CM

## 2017-05-16 DIAGNOSIS — E663 Overweight: Secondary | ICD-10-CM | POA: Diagnosis not present

## 2017-05-16 DIAGNOSIS — I10 Essential (primary) hypertension: Secondary | ICD-10-CM

## 2017-05-16 DIAGNOSIS — Z5181 Encounter for therapeutic drug level monitoring: Secondary | ICD-10-CM

## 2017-05-16 DIAGNOSIS — Z0000 Encounter for general adult medical examination without abnormal findings: Secondary | ICD-10-CM | POA: Diagnosis not present

## 2017-05-16 DIAGNOSIS — E119 Type 2 diabetes mellitus without complications: Secondary | ICD-10-CM | POA: Diagnosis not present

## 2017-05-16 DIAGNOSIS — N200 Calculus of kidney: Secondary | ICD-10-CM | POA: Diagnosis not present

## 2017-05-16 DIAGNOSIS — E782 Mixed hyperlipidemia: Secondary | ICD-10-CM | POA: Diagnosis not present

## 2017-05-16 LAB — LIPID PANEL
Cholesterol: 155 mg/dL (ref 0–200)
HDL: 40.3 mg/dL
LDL Cholesterol: 95 mg/dL (ref 0–99)
NonHDL: 115.09
Total CHOL/HDL Ratio: 4
Triglycerides: 98 mg/dL (ref 0.0–149.0)
VLDL: 19.6 mg/dL (ref 0.0–40.0)

## 2017-05-16 LAB — HEMOGLOBIN A1C: Hgb A1c MFr Bld: 7 % — ABNORMAL HIGH (ref 4.6–6.5)

## 2017-05-16 LAB — COMPREHENSIVE METABOLIC PANEL
ALT: 23 U/L (ref 0–53)
AST: 22 U/L (ref 0–37)
Albumin: 4.2 g/dL (ref 3.5–5.2)
Alkaline Phosphatase: 63 U/L (ref 39–117)
BUN: 18 mg/dL (ref 6–23)
CO2: 24 mEq/L (ref 19–32)
Calcium: 9.1 mg/dL (ref 8.4–10.5)
Chloride: 105 mEq/L (ref 96–112)
Creatinine, Ser: 0.95 mg/dL (ref 0.40–1.50)
GFR: 84.44 mL/min (ref >60.00)
Glucose, Bld: 210 mg/dL — ABNORMAL HIGH (ref 70–99)
Potassium: 3.9 mEq/L (ref 3.5–5.1)
Sodium: 139 mEq/L (ref 135–145)
Total Bilirubin: 0.6 mg/dL (ref 0.2–1.2)
Total Protein: 6.6 g/dL (ref 6.0–8.3)

## 2017-05-16 LAB — PSA: PSA: 3.11 ng/mL (ref 0.10–4.00)

## 2017-05-16 LAB — CBC
HCT: 36.1 % — ABNORMAL LOW (ref 39.0–52.0)
Hemoglobin: 13 g/dL (ref 13.0–17.0)
MCHC: 35.9 g/dL (ref 30.0–36.0)
MCV: 95.9 fl (ref 78.0–100.0)
Platelets: 229 10*3/uL (ref 150.0–400.0)
RBC: 3.76 Mil/uL — ABNORMAL LOW (ref 4.22–5.81)
RDW: 13.8 % (ref 11.5–15.5)
WBC: 6.6 10*3/uL (ref 4.0–10.5)

## 2017-05-16 MED ORDER — FLUOXETINE HCL 20 MG PO TABS: 40.0000 mg | ORAL_TABLET | Freq: Every day | ORAL | 3 refills | Status: DC

## 2017-05-16 MED ORDER — METFORMIN HCL 500 MG PO TABS: ORAL_TABLET | ORAL | 3 refills | Status: DC

## 2017-05-16 MED ORDER — LISINOPRIL 20 MG PO TABS: 20.0000 mg | ORAL_TABLET | Freq: Every day | ORAL | 3 refills | Status: DC

## 2017-05-16 MED ORDER — PRAVASTATIN SODIUM 40 MG PO TABS: ORAL_TABLET | ORAL | 3 refills | Status: DC

## 2017-05-16 NOTE — Patient Instructions (Addendum)
It was great to see you again today!  Take care and I will be in touch with your labs asap I refilled your medications today as below Start back on 20 mg of prozac- after 2 weeks increase to 2 tablets daily if you like  Please do schedule an eye exam soon and have your eye doc send me your report  Let's plan to visit in about 6 months

## 2017-05-24 ENCOUNTER — Other Ambulatory Visit: Payer: Self-pay | Admitting: Family Medicine

## 2017-06-01 ENCOUNTER — Other Ambulatory Visit: Payer: Self-pay | Admitting: Family Medicine

## 2017-06-01 DIAGNOSIS — I10 Essential (primary) hypertension: Secondary | ICD-10-CM

## 2017-07-30 ENCOUNTER — Other Ambulatory Visit: Payer: Self-pay | Admitting: Family Medicine

## 2017-09-01 ENCOUNTER — Other Ambulatory Visit: Payer: Self-pay | Admitting: Emergency Medicine

## 2017-09-01 DIAGNOSIS — I10 Essential (primary) hypertension: Secondary | ICD-10-CM

## 2017-09-01 MED ORDER — LISINOPRIL 20 MG PO TABS: 20.0000 mg | ORAL_TABLET | Freq: Every day | ORAL | 3 refills | Status: DC

## 2017-09-10 ENCOUNTER — Emergency Department: Payer: BLUE CROSS/BLUE SHIELD

## 2017-09-10 ENCOUNTER — Emergency Department
Admission: EM | Admit: 2017-09-10 | Discharge: 2017-09-10 | Disposition: A | Discharge status: Home / Self Care | Payer: BLUE CROSS/BLUE SHIELD | Attending: Emergency Medicine | Admitting: Emergency Medicine

## 2017-09-10 ENCOUNTER — Encounter: Payer: Self-pay | Admitting: Emergency Medicine

## 2017-09-10 DIAGNOSIS — I10 Essential (primary) hypertension: Secondary | ICD-10-CM | POA: Insufficient documentation

## 2017-09-10 DIAGNOSIS — S61411A Laceration without foreign body of right hand, initial encounter: Secondary | ICD-10-CM | POA: Insufficient documentation

## 2017-09-10 DIAGNOSIS — E119 Type 2 diabetes mellitus without complications: Secondary | ICD-10-CM | POA: Insufficient documentation

## 2017-09-10 DIAGNOSIS — Z7982 Long term (current) use of aspirin: Secondary | ICD-10-CM | POA: Insufficient documentation

## 2017-09-10 DIAGNOSIS — W540XXA Bitten by dog, initial encounter: Secondary | ICD-10-CM | POA: Diagnosis not present

## 2017-09-10 DIAGNOSIS — Z79899 Other long term (current) drug therapy: Secondary | ICD-10-CM | POA: Insufficient documentation

## 2017-09-10 DIAGNOSIS — Z7984 Long term (current) use of oral hypoglycemic drugs: Secondary | ICD-10-CM | POA: Insufficient documentation

## 2017-09-10 DIAGNOSIS — Y992 Volunteer activity: Secondary | ICD-10-CM | POA: Diagnosis not present

## 2017-09-10 DIAGNOSIS — Y93K9 Activity, other involving animal care: Secondary | ICD-10-CM | POA: Insufficient documentation

## 2017-09-10 DIAGNOSIS — Y929 Unspecified place or not applicable: Secondary | ICD-10-CM | POA: Insufficient documentation

## 2017-09-10 DIAGNOSIS — S6991XA Unspecified injury of right wrist, hand and finger(s), initial encounter: Secondary | ICD-10-CM | POA: Diagnosis not present

## 2017-09-10 MED ORDER — HYDROCODONE-ACETAMINOPHEN 5-325 MG PO TABS
ORAL_TABLET | ORAL | Status: AC
  Filled 2017-09-10: qty 1

## 2017-09-10 MED ORDER — AMOXICILLIN-POT CLAVULANATE 875-125 MG PO TABS: 1.0000 | ORAL_TABLET | Freq: Two times a day (BID) | ORAL | 0 refills | Status: AC

## 2017-09-10 MED ORDER — LIDOCAINE HCL 1 % IJ SOLN
10.0000 mL | Freq: Once | INTRAMUSCULAR | Status: AC
  Administered 2017-09-10: 10 mL
  Filled 2017-09-10: qty 10

## 2017-09-10 MED ORDER — LIDOCAINE HCL (PF) 1 % IJ SOLN
INTRAMUSCULAR | Status: AC
  Filled 2017-09-10: qty 10

## 2017-09-10 MED ORDER — HYDROCODONE-ACETAMINOPHEN 5-325 MG PO TABS: 1.0000 | ORAL_TABLET | Freq: Four times a day (QID) | ORAL | 0 refills | Status: AC | PRN

## 2017-09-10 MED ORDER — HYDROCODONE-ACETAMINOPHEN 5-325 MG PO TABS
1.0000 | ORAL_TABLET | Freq: Once | ORAL | Status: AC
  Administered 2017-09-10: 1 via ORAL

## 2017-09-10 MED ORDER — AMOXICILLIN-POT CLAVULANATE 875-125 MG PO TABS
ORAL_TABLET | ORAL | Status: AC
  Filled 2017-09-10: qty 1

## 2017-09-10 MED ORDER — AMOXICILLIN-POT CLAVULANATE 875-125 MG PO TABS
1.0000 | ORAL_TABLET | Freq: Once | ORAL | Status: AC
  Administered 2017-09-10: 1 via ORAL

## 2017-09-10 NOTE — ED Triage Notes (Addendum)
Patient was bitten on the right hand with punctures to his right palmar surface and posterior hand - skin tear  Dog's owner is in this facility currently with proof of vaccination of the animal.  Report of incident filed by [facility] [employer]

## 2017-09-10 NOTE — ED Provider Notes (Signed)
[facility] Emergency Department Provider Note  ____________________________________________  Time seen: Approximately 10:04 PM  I have reviewed the triage vital signs and the nursing notes.   HISTORY  Chief Complaint Animal Bite    HPI Chad Cox is a 66 y.o. male who presents to the emergency department with right hand pain after patient was bitten by a [place] husky.  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  His tetanus status is up-to-date.  He denies weakness or changes in sensation of the right upper extremity.  No alleviating measures were attempted prior to presenting to the emergency department.   Past Medical History:  Diagnosis Date  . Diabetes mellitus without complication (HCC)   . Hypertension   . Kidney stone     Patient Active Problem List   Diagnosis Date Noted  . Obesity, unspecified 04/29/2014  . Diabetes mellitus 01/30/2012  . Hypertension 01/30/2012  . Dyslipidemia 01/30/2012    History reviewed. No pertinent surgical history.  Prior to Admission medications   Medication Sig Start Date End Date Taking? Authorizing Provider  amoxicillin-clavulanate (AUGMENTIN) 875-125 MG tablet Take 1 tablet by mouth 2 (two) times daily. 09/10/17 09/20/17  Orvil Feil, PA-C  aspirin 81 MG tablet Take 81 mg by mouth daily.    [provider]  cyclobenzaprine (FLEXERIL) 5 MG tablet Take 1 tablet (5 mg total) by mouth daily as needed. 03/30/17   Copland, Gwenlyn Found, MD  FLUoxetine (PROZAC) 20 MG tablet Take 2 tablets (40 mg total) by mouth daily. 05/16/17   Copland, Gwenlyn Found, MD  HYDROcodone-acetaminophen (NORCO) 5-325 MG tablet Take 1 tablet by mouth every 6 (six) hours as needed for moderate pain. 09/10/17 09/12/17  Orvil Feil, PA-C  lisinopril (PRINIVIL,ZESTRIL) 20 MG tablet TAKE 1 TABLET(20 MG) BY MOUTH DAILY 06/02/17   Copland, Gwenlyn Found, MD  lisinopril (PRINIVIL,ZESTRIL) 20  MG tablet Take 1 tablet (20 mg total) by mouth daily. 09/01/17   Copland, Gwenlyn Found, MD  metFORMIN (GLUCOPHAGE) 500 MG tablet TAKE 1 TABLET(500 MG) BY MOUTH DAILY WITH BREAKFAST 05/16/17   Copland, Gwenlyn Found, MD  metFORMIN (GLUCOPHAGE) 500 MG tablet TAKE 1 TABLET(500 MG) BY MOUTH DAILY WITH BREAKFAST 08/01/17   Copland, Gwenlyn Found, MD  Multiple Vitamin (MULTIVITAMIN) tablet Take 1 tablet by mouth daily.    [provider]  naproxen sodium (ANAPROX) 220 MG tablet Take 2 tablets (440 mg total) by mouth 2 (two) times daily as needed (pain). 03/30/17   Copland, Gwenlyn Found, MD  ondansetron (ZOFRAN ODT) 8 MG disintegrating tablet Take 1 tablet (8 mg total) by mouth every 8 (eight) hours as needed for nausea or vomiting. Patient not taking: Reported on 05/16/2017 03/28/17   Saguier, Ramon Dredge, PA-C  pravastatin (PRAVACHOL) 40 MG tablet TAKE 1 TABLET(40 MG) BY MOUTH DAILY 05/16/17   Copland, Gwenlyn Found, MD  tamsulosin (FLOMAX) 0.4 MG CAPS capsule TAKE 1 CAPSULE(0.4 MG) BY MOUTH DAILY 05/25/17   Copland, Gwenlyn Found, MD    Allergies Adhesive [tape] and Betadine [povidone iodine]  Family History  Problem Relation Age of Onset  . Atrial fibrillation Mother     Social History Social History  Substance Use Topics  . Smoking status: Never Smoker  . Smokeless tobacco: Never Used  . Alcohol use No     Review of Systems  Constitutional: No fever/chills Eyes: No visual changes. No discharge ENT: No upper respiratory complaints. Cardiovascular: no chest pain. Respiratory: no cough.  No SOB. Musculoskeletal: Negative for musculoskeletal pain. Skin: Patient has dog bite. Neurological: Negative for headaches, focal weakness or numbness.   ____________________________________________   PHYSICAL EXAM:  VITAL SIGNS: ED Triage Vitals [09/10/17 1747]  Enc Vitals Group     BP (!) 165/78     Pulse Rate 81     Resp 18     Temp 98.9 F (37.2 C)     Temp src      SpO2 97 %     Weight 240 lb (108.9 kg)      Height 6' (1.829 m)     Head Circumference      Peak Flow      Pain Score 4     Pain Loc      Pain Edu?      Excl. in GC?      Constitutional: Alert and oriented. Well appearing and in no acute distress. Eyes: Conjunctivae are normal. PERRL. EOMI. Head: Atraumatic. Cardiovascular: Normal rate, regular rhythm. Normal S1 and S2.  Good peripheral circulation. Respiratory: Normal respiratory effort without tachypnea or retractions. Lungs CTAB. Good air entry to the bases with no decreased or absent breath sounds. Musculoskeletal: Patient is able to perform full range of motion at the right wrist and right elbow.  Palpable radial pulse, right. Neurologic:  Normal speech and language. No gross focal neurologic deficits are appreciated.  Skin: Patient has a 4 cm, jagged skin tear of the right hand.  Patient also has 2 associated puncture marks of the right hand. Psychiatric: Mood and affect are normal. Speech and behavior are normal. Patient exhibits appropriate insight and judgement.   ____________________________________________   LABS (all labs ordered are listed, but only abnormal results are displayed)  Labs Reviewed - No data to display ____________________________________________  EKG   ____________________________________________  RADIOLOGY Geraldo PitterI, Jaclyn M Woods, personally viewed and evaluated these images (plain radiographs) as part of my medical decision making, as well as reviewing the written report by the radiologist.  Dg Hand Complete Right  Result Date: 09/10/2017 CLINICAL DATA:  Initial evaluation for acute dog bite to first metacarpal. EXAM: RIGHT HAND - COMPLETE 3+ VIEW COMPARISON:  None. FINDINGS: No acute fracture or dislocation. Scattered degenerative osteoarthritic changes present throughout the hand. Mild soft tissue swelling at the radial aspect of the proximal hand, consistent with history of dog bite. No radiopaque foreign body. IMPRESSION: 1. Mild soft tissue  irregularity at the radial aspect of the proximal hand, consistent with history of dog bite. No radiopaque foreign body. 2. No acute osseous abnormality about the hand. Electronically Signed   By: Rise MuBenjamin  McClintock M.D.   On: 09/10/2017 21:27    ____________________________________________    PROCEDURES  Procedure(s) performed:    Procedures  LACERATION REPAIR Performed by: Orvil FeilJaclyn M Woods Authorized by: Orvil FeilJaclyn M Woods Consent: Verbal consent obtained. Risks and benefits: risks, benefits and alternatives were discussed Consent given by: patient Patient identity confirmed: provided demographic data Prepped and Draped in normal sterile fashion Wound explored  Laceration Location: Right hand  Laceration Length: 4 cm  No Foreign Bodies seen or palpated  Anesthesia: local infiltration  Local anesthetic: lidocaine 1% without epinephrine  Anesthetic total: 10 ml  Irrigation method: syringe Amount of cleaning: standard  Skin closure: 4-0 Ethilon  Number of sutures: 9  Technique: Simple interrupted  Patient tolerance: Patient tolerated the procedure well with no immediate complications.   Medications  lidocaine (XYLOCAINE) 1 % (with pres) injection 10 mL (10 mLs Infiltration Given  by Other 09/10/17 2045)  HYDROcodone-acetaminophen (NORCO/VICODIN) 5-325 MG per tablet 1 tablet (1 tablet Oral Given 09/10/17 2157)  amoxicillin-clavulanate (AUGMENTIN) 875-125 MG per tablet 1 tablet (1 tablet Oral Given 09/10/17 2157)     ____________________________________________   INITIAL IMPRESSION / ASSESSMENT AND PLAN / ED COURSE  Pertinent labs & imaging results that were available during my care of the patient were reviewed by me and considered in my medical decision making (see chart for details).  Review of the [place] CSRS was performed in accordance of the [facility] prior to dispensing any controlled drugs.    Assessment and plan Dog bite Patient presents to the emergency  department after 1 of the [place] husky's that he helps to take care of bit him.  X-ray examination reveals no foreign bodies.  Patient underwent laceration repair and was advised to have sutures removed by primary care in 9 days.  Patient was discharged with Augmentin.  Patient was advised to take medication due to high risk for infection without antibiotic compliance.  Patient was also discharged with Norco.  Vital signs are reassuring prior to discharge.  All patient questions were answered.   ____________________________________________  FINAL CLINICAL IMPRESSION(S) / ED DIAGNOSES  Final diagnoses:  Dog bite, initial encounter      NEW MEDICATIONS STARTED DURING THIS VISIT:  Discharge Medication List as of 09/10/2017  9:44 PM    START taking these medications   Details  amoxicillin-clavulanate (AUGMENTIN) 875-125 MG tablet Take 1 tablet by mouth 2 (two) times daily., Starting Sat 09/10/2017, Until Tue 09/20/2017, Print            This chart was dictated using voice recognition software/Dragon. Despite best efforts to proofread, errors can occur which can change the meaning. Any change was purely unintentional.    Orvil Feil, PA-C 09/10/17 2209    Jeanmarie Plant, MD 09/10/17 [phone]

## 2017-09-10 NOTE — ED Notes (Signed)

## 2017-09-10 NOTE — ED Notes (Signed)
Pt stepped out of the room to use the restroom/ staff informed

## 2017-09-14 ENCOUNTER — Encounter: Payer: Self-pay | Admitting: Family Medicine

## 2017-09-14 ENCOUNTER — Ambulatory Visit: Payer: BLUE CROSS/BLUE SHIELD | Admitting: Family Medicine

## 2017-09-14 VITALS — BP 128/67 | HR 65 | Temp 98.2°F | Resp 16 | Wt 242.1 lb

## 2017-09-14 DIAGNOSIS — L03113 Cellulitis of right upper limb: Secondary | ICD-10-CM

## 2017-09-14 MED ORDER — CEFTRIAXONE SODIUM 1 G IJ SOLR
1.0000 g | Freq: Once | INTRAMUSCULAR | Status: AC
  Administered 2017-09-14: 1 g via INTRAMUSCULAR

## 2017-09-14 NOTE — Progress Notes (Signed)
[facility] [address], [address] [place], [place] [address] [phone] [phone]  Date:  09/14/2017   Name:  Chad Cox   DOB:  1951/02/27   MRN:  [id number]  PCP:  Chad Cox, Chad Cox    Chief Complaint: Animal Bite (Pt reports dog bite Sat. went to ED and still swollen ,redness and tender to touch )   History of Present Illness:  Chad Cox is a 66 y.o. 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  He was bitten over the dorsal radial aspect of the hand and has one tooth puncture in the palm as well He went to the ER at [facility] the same day- he has approx 9 sutures in the dorsum of the hand and one in the palmar aspect It started to swell and look a bit red since Sunday He is on augmentin per the ER and is taking this faithfully  He had a bit of a fever on Sunday- none today.  Up to 100.4.  This seems to have resolved now He does not have any flu like sx. However the hand was getting more red and tender, so he got a bit worried and decided to come in Patient Active Problem List   Diagnosis Date Noted  . Obesity, unspecified 04/29/2014  . Diabetes mellitus 01/30/2012  . Hypertension 01/30/2012  . Dyslipidemia 01/30/2012    Past Medical History:  Diagnosis Date  . Diabetes mellitus without complication (HCC)   . Hypertension   . Kidney stone     No past surgical history on file.  Social History   Tobacco Use  . Smoking status: Never Smoker  . Smokeless tobacco: Never Used  Substance Use Topics  . Alcohol use: No  . Drug use: No    Family History  Problem Relation Age of Onset  . Atrial fibrillation Mother     Allergies  Allergen Reactions  . Adhesive [Tape]     "acts like poison ivy" blisters up, itching  . Betadine [Povidone Iodine] Other (See Comments)    Blisters, itching    Medication list has been reviewed and updated.  Current Outpatient Medications on File Prior to Visit  Medication Sig Dispense Refill  . amoxicillin-clavulanate (AUGMENTIN) 875-125 MG tablet Take 1 tablet by mouth 2 (two) times daily. 20 tablet 0  . aspirin 81 MG tablet Take 81 mg by mouth daily.    . cyclobenzaprine (FLEXERIL) 5 MG tablet Take 1 tablet (5 mg total) by mouth daily as needed. 30 tablet 1  . FLUoxetine (PROZAC) 20 MG tablet Take 2 tablets (40 mg total) by mouth daily. 180 tablet 3  . lisinopril (PRINIVIL,ZESTRIL) 20 MG tablet Take 1 tablet (20 mg total) by mouth daily. 90 tablet 3  . metFORMIN (GLUCOPHAGE) 500 MG tablet TAKE 1 TABLET(500 MG) BY MOUTH DAILY WITH BREAKFAST 90 tablet 3  . metFORMIN (GLUCOPHAGE) 500 MG tablet TAKE 1 TABLET(500 MG) BY MOUTH DAILY WITH BREAKFAST 30 tablet 0  . Multiple Vitamin (MULTIVITAMIN) tablet Take 1 tablet by mouth daily.    . naproxen sodium (ANAPROX) 220 MG tablet Take 2 tablets (440 mg total) by mouth 2 (two) times daily as needed (pain). 60 tablet 2  . ondansetron (ZOFRAN ODT) 8 MG disintegrating tablet Take 1 tablet (8 mg total) by mouth  every 8 (eight) hours as needed for nausea or vomiting. 20 tablet 0  . pravastatin (PRAVACHOL) 40 MG tablet TAKE 1 TABLET(40 MG) BY MOUTH DAILY 90 tablet 3   No current facility-administered medications on file prior to visit.     Review of Systems:  As per HPI- otherwise negative. No fever now No vomiting No chills  Physical Examination: Vitals:   09/14/17 1520  BP: 128/67  Pulse: 65  Resp: 16  Temp: 98.2 F (36.8 C)  SpO2: 99%   Vitals:   09/14/17 1520  Weight: 242 lb 1.6 oz (109.8 kg)   Body mass index is 32.83 kg/m. Ideal Body Weight:    GEN: WDWN, NAD, Non-toxic, A & O x 3, looks well HEENT: Atraumatic, Normocephalic. Neck supple. No masses, No LAD. Ears and Nose: No external deformity. CV: RRR, No M/G/R. No JVD. No thrill. No extra heart sounds. PULM:  CTA B, no wheezes, crackles, rhonchi. No retractions. No resp. distress. No accessory muscle use. EXTR: No c/c/e NEURO Normal gait.  PSYCH: Normally interactive. Conversant. Not depressed or anxious appearing.  Calm demeanor.  Right hand: he has approx 9 sutures on the dorsum of the hand, radial aspect, and 1 in the palm.  Normal ROM and strength of fingers He has mild redness and swelling of the hand around the wound.  No pus is expressed from wound.  The wound and surrounding area are quite tender Given 1 gm of rocephin IM once Removed every other suture.    Assessment and Plan: Cellulitis of right hand - Plan: cefTRIAXone (ROCEPHIN) injection 1 g  Infected dog bite of hand.  It does not look great, but does not appear to need urgent surgical treatment either.  IM rocephin and removed some sutures as above.  Will see him back tomorrow for a recheck ER if any dramatic change overnight   Signed Chad AmsterdamJessica Dakwon Wenberg, Cox

## 2017-09-15 ENCOUNTER — Encounter: Payer: Self-pay | Admitting: Family Medicine

## 2017-09-15 ENCOUNTER — Ambulatory Visit: Payer: BLUE CROSS/BLUE SHIELD | Admitting: Family Medicine

## 2017-09-15 VITALS — BP 130/70 | HR 69 | Temp 98.8°F | Ht 72.0 in | Wt 242.0 lb

## 2017-09-15 DIAGNOSIS — L03113 Cellulitis of right upper limb: Secondary | ICD-10-CM | POA: Diagnosis not present

## 2017-09-15 DIAGNOSIS — M79641 Pain in right hand: Secondary | ICD-10-CM | POA: Diagnosis not present

## 2017-09-15 DIAGNOSIS — S61451A Open bite of right hand, initial encounter: Secondary | ICD-10-CM | POA: Diagnosis not present

## 2017-09-15 DIAGNOSIS — S61551A Open bite of right wrist, initial encounter: Secondary | ICD-10-CM | POA: Diagnosis not present

## 2017-09-15 DIAGNOSIS — E119 Type 2 diabetes mellitus without complications: Secondary | ICD-10-CM | POA: Diagnosis not present

## 2017-09-15 DIAGNOSIS — W540XXA Bitten by dog, initial encounter: Secondary | ICD-10-CM | POA: Diagnosis not present

## 2017-09-15 NOTE — Progress Notes (Signed)
[facility] [address], [address] [place], [place] [address] [phone]  Date:  09/15/2017   Name:  Chad Cox Omara   DOB:  [birth year]   MRN:  [id number]  PCP:  Pearline Cablesopland, Tajha Sammarco C, MD    Chief Complaint: Follow-up (Pt here for dog bite follow up visit. )   History of Present Illness:  Chad Cox is a 66 y.o. very pleasant male patient who presents with the following:  Seen here yesterday with an infected dog bite wound to right hand.  He was already on augmentin, we gave a gm of rocephin IM and planned follow-up today.  If not improved plan to consult hand surgery Since yesterday he feels that his hand may be a bit better- the pain is slightly decreased.  However it does not look great, and I am concerned going into the weekend that he could get worse   He does feel sure that the dog who bit him is vaccinated against rabies   He does not have any fever or chills He otherwise feels well  Patient Active Problem List   Diagnosis Date Noted  . Obesity, unspecified 04/29/2014  . Diabetes mellitus 01/30/2012  . Hypertension 01/30/2012  . Dyslipidemia 01/30/2012    Past Medical History:  Diagnosis Date  . Diabetes mellitus without complication (HCC)   . Hypertension   . Kidney stone     No past surgical history on file.  Social History   Tobacco Use  . Smoking status: Never Smoker  . Smokeless tobacco: Never Used  Substance Use Topics  . Alcohol use: No  . Drug use: No    Family History  Problem Relation Age of Onset  . Atrial fibrillation Mother     Allergies  Allergen Reactions  . Adhesive [Tape]     "acts like poison ivy" blisters up, itching  . Betadine [Povidone Iodine] Other (See Comments)    Blisters, itching    Medication list has been reviewed and updated.  Current Outpatient Medications on File Prior to Visit  Medication Sig Dispense Refill  . amoxicillin-clavulanate (AUGMENTIN)  875-125 MG tablet Take 1 tablet by mouth 2 (two) times daily. 20 tablet 0  . aspirin 81 MG tablet Take 81 mg by mouth daily.    . cyclobenzaprine (FLEXERIL) 5 MG tablet Take 1 tablet (5 mg total) by mouth daily as needed. 30 tablet 1  . FLUoxetine (PROZAC) 20 MG tablet Take 2 tablets (40 mg total) by mouth daily. 180 tablet 3  . lisinopril (PRINIVIL,ZESTRIL) 20 MG tablet Take 1 tablet (20 mg total) by mouth daily. 90 tablet 3  . metFORMIN (GLUCOPHAGE) 500 MG tablet TAKE 1 TABLET(500 MG) BY MOUTH DAILY WITH BREAKFAST 90 tablet 3  . metFORMIN (GLUCOPHAGE) 500 MG tablet TAKE 1 TABLET(500 MG) BY MOUTH DAILY WITH BREAKFAST 30 tablet 0  . Multiple Vitamin (MULTIVITAMIN) tablet Take 1 tablet by mouth daily.    . naproxen sodium (ANAPROX) 220 MG tablet Take 2 tablets (440 mg total) by mouth 2 (two) times daily as needed (pain). 60 tablet 2  . ondansetron (ZOFRAN ODT) 8 MG disintegrating tablet Take 1 tablet (8 mg total) by mouth every 8 (eight) hours as needed for nausea or vomiting. 20 tablet 0  . pravastatin (PRAVACHOL) 40 MG tablet TAKE 1 TABLET(40 MG) BY MOUTH DAILY 90 tablet 3   No current facility-administered medications on file prior to visit.     Review of Systems:  As per HPI- otherwise negative.   Physical Examination: Vitals:   09/15/17 1044  BP: 130/70  Pulse: 69  Temp: 98.8 F (37.1 C)  SpO2: 96%   Vitals:   09/15/17 1044  Weight: 242 lb (109.8 kg)  Height: 6' (1.829 m)   Body mass index is 32.82 kg/m. Ideal Body Weight: Weight in (lb) to have BMI = 25: 183.9   GEN: WDWN, NAD, Non-toxic, Alert & Oriented x 3 HEENT: Atraumatic, Normocephalic.  Ears and Nose: No external deformity. EXTR: No clubbing/cyanosis/edema NEURO: Normal gait.  PSYCH: Normally interactive. Conversant. Not depressed or anxious appearing.  Calm demeanor.  Using right hand to drink coffee when I came into the room Wound looks about the same as yesterday, still quite tender to touch Swelling and  surrounding redness minimally improved    Assessment and Plan: Cellulitis of right hand  Here today for a recheck of his hand Looks about the same.  Called Dr. Merrilee SeashoreKuzma's office who kindly agreed to see him this afternoon.  I do not strongly suspect he needs emergent wash out in the OR, but do not want to take a chance with the weekend approaching Pt is in agreement with plan   Signed Abbe AmsterdamJessica Tajon Moring, MD Nothing to eat- will check at office at 1:45

## 2017-09-15 NOTE — Patient Instructions (Signed)
Please go to [facility] at 1:45 today Nothing to eat or drink until then please!  Address: [address], [place], [place] [address]   Phone: [phone]  Please take your printed records from today with you to this appt  Take care!

## 2017-09-16 DIAGNOSIS — T148XXA Other injury of unspecified body region, initial encounter: Secondary | ICD-10-CM | POA: Diagnosis not present

## 2017-09-19 ENCOUNTER — Ambulatory Visit: Payer: BLUE CROSS/BLUE SHIELD | Admitting: Medical

## 2017-09-19 DIAGNOSIS — Z0289 Encounter for other administrative examinations: Secondary | ICD-10-CM

## 2017-09-21 DIAGNOSIS — S61451D Open bite of right hand, subsequent encounter: Secondary | ICD-10-CM | POA: Diagnosis not present

## 2017-09-21 DIAGNOSIS — E119 Type 2 diabetes mellitus without complications: Secondary | ICD-10-CM | POA: Diagnosis not present

## 2017-09-21 DIAGNOSIS — W540XXD Bitten by dog, subsequent encounter: Secondary | ICD-10-CM | POA: Diagnosis not present

## 2017-09-21 DIAGNOSIS — S61551D Open bite of right wrist, subsequent encounter: Secondary | ICD-10-CM | POA: Diagnosis not present

## 2017-09-28 DIAGNOSIS — T148XXA Other injury of unspecified body region, initial encounter: Secondary | ICD-10-CM | POA: Diagnosis not present

## 2018-05-16 ENCOUNTER — Other Ambulatory Visit: Payer: Self-pay | Admitting: Family Medicine

## 2018-05-16 DIAGNOSIS — E782 Mixed hyperlipidemia: Secondary | ICD-10-CM

## 2018-05-16 DIAGNOSIS — F320 Major depressive disorder, single episode, mild: Secondary | ICD-10-CM

## 2018-05-27 NOTE — Progress Notes (Signed)
[facility] [address], [address] [place], [place] [address] [phone]  Date:  05/29/2018   Name:  Chad Cox   DOB:  09-23-1951   MRN:  [id number]  PCP:  Pearline Cablesopland, Nathian Stencil C, MD    Chief Complaint: Medication Refills (acid reflux, several months, taking otc meds, worse at night)   History of Present Illness:  Chad Cox is a 67 y.o. very pleasant male patient who presents with the following:  Following up today on chronic health conditions including DM, HTN, hyperlipidemia I last saw him for a CPE about a year ago as follows, and we also visited last fall after he suffered a dog bite to his hand.  He saw hand surgery for a consultation but did not need any treatment and recovered well   History of obesity, HTN, DM, kidney stones and dyslipidemia.  Here today for his CPE Earlier this year he had a lot of trouble with kidney stones and required acute care several times for severe pain.  He had a CT in May: IMPRESSION: Distal left ureteral 2 mm obstructing stone located 1 cm proximal to the left ureterovesical junction causing mild left hydroureteronephrosis.  Tiny nonobstructing right renal calculi. Colonic diverticulosis Aortic atherosclerosis. Coronary artery calcifications. Top-normal size slightly fatty liver. Degenerative changes lumbar spine. Prostate gland causes minimal impression upon the bladder base.  We had planned to have him see urology - he did not end up going however since he seems to have passed his stone at home.   He is passing his urine normally now- no further issues since he passed the stone at the end of May.    RecentLabs       Lab Results  Component Value Date   HGBA1C 7.0 (H) 05/16/2017     He continues to do a lot of work with a [employer] which he really enjoys.   Colonoscopy 2013 He would like to go back on his prozac- he felt like he was better when he was taking  this and would like to go back on. He stopped taking it 2-3 months ago .  He had been up to 40 mg when he was taking it earlier this year  He is thinking about starting nutrisystem to lose weight- thinks he would do well with his meals being delivered as he does not really cook  Labs: will do today, he is fasting today  Colon: 2013 Immun: do prevnar 13 now - however pt declines as he had a very strong reaction to pneumovax, caused a lot of arm pain  Eye exam: he would estimate this time last year, he will go soon Foot exam: due today  Lab notes from last year as follows- I had asked him to come in for a repeat PSA but he did not appear- wil ldo today  Your cholesterol looks fine- continue pravachol  Metabolic profile is normal except for high blood sugar. Also, I'm afraid your A1c is now definitely in the diabetes range.  Continue your metformin. Since you have a concrete plan for weight loss I am hopeful that we can get your A1c lower again without any medication changes. Please work on this and let's plan to meet in 3 months to see where you are  Also, your PSA is in normal range but it has gone up a good bit since last year. This may be simply benign variation, but I would like to  repeat your PSA in 3 months as we  His right hand is doing well- he has a bit on tingling but his strength and function is normal  He needs labs and refills today He has noted indigestion and reflux at night He has noted this for about 2 months He feels "like I can breathe fire."  He does not have vomiting He may regurg food on very rare occasion He tries to eat light in the evening and to prop up in bed- these measures do help He is using zantac OTC at times He has had reflux in the past and we did a PPI for about 30 days  He did do 14 days of prilosec maybe a few months ago - in February  He is not having any CP or SOB  He is coughing up some phlegm  He is taking 2 aleve most nights- we discussed how  NSAIDs may worsen GI symptoms   He is on 40 mg of prozac and doing well with it, he is thinking of tapering off and he discussed how to do this. He feels like he no longer needs this but will let me know if he does not do well once he comes off the med  metformin 500 mg once a day   Admits he is not really exercising that much He does not have any CP or SOB with exertion however  No urinary concerns No GI concerns  Lab Results  Component Value Date   HGBA1C 9.1 (H) 05/29/2018   Lab Results  Component Value Date   PSA 2.52 05/29/2018   PSA 3.11 05/16/2017   PSA 1.63 04/01/2016     Patient Active Problem List   Diagnosis Date Noted  . Obesity, unspecified 04/29/2014  . Diabetes mellitus 01/30/2012  . Hypertension 01/30/2012  . Dyslipidemia 01/30/2012    Past Medical History:  Diagnosis Date  . Diabetes mellitus without complication (HCC)   . Hypertension   . Kidney stone     History reviewed. No pertinent surgical history.  Social History   Tobacco Use  . Smoking status: Never Smoker  . Smokeless tobacco: Never Used  Substance Use Topics  . Alcohol use: No  . Drug use: No    Family History  Problem Relation Age of Onset  . Atrial fibrillation Mother     Allergies  Allergen Reactions  . Adhesive [Tape]     "acts like poison ivy" blisters up, itching  . Betadine [Povidone Iodine] Other (See Comments)    Blisters, itching    Medication list has been reviewed and updated.  Current Outpatient Medications on File Prior to Visit  Medication Sig Dispense Refill  . aspirin 81 MG tablet Take 81 mg by mouth daily.    . cyclobenzaprine (FLEXERIL) 5 MG tablet Take 1 tablet (5 mg total) by mouth daily as needed. 30 tablet 1  . Multiple Vitamin (MULTIVITAMIN) tablet Take 1 tablet by mouth daily.    . naproxen sodium (ANAPROX) 220 MG tablet Take 2 tablets (440 mg total) by mouth 2 (two) times daily as needed (pain). 60 tablet 2  . ondansetron (ZOFRAN ODT) 8 MG  disintegrating tablet Take 1 tablet (8 mg total) by mouth every 8 (eight) hours as needed for nausea or vomiting. 20 tablet 0  . ranitidine (ZANTAC) 150 MG tablet Take 150 mg by mouth as needed for heartburn.     No current facility-administered medications on file prior to visit.     Review  of Systems:  As per HPI- otherwise negative. No fever or chills  No weight loss  Wt Readings from Last 3 Encounters:  05/29/18 243 lb (110.2 kg)  09/15/17 242 lb (109.8 kg)  09/14/17 242 lb 1.6 oz (109.8 kg)       Physical Examination: Vitals:   05/29/18 0943  BP: 122/78  Pulse: 86  Resp: 16  Temp: 98.3 F (36.8 C)  SpO2: 96%   Vitals:   05/29/18 0943  Weight: 243 lb (110.2 kg)  Height: 6' (1.829 m)   Body mass index is 32.96 kg/m. Ideal Body Weight: Weight in (lb) to have BMI = 25: 183.9  GEN: WDWN, NAD, Non-toxic, A & O x 3, obese, otherwise looks well  HEENT: Atraumatic, Normocephalic. Neck supple. No masses, No LAD. Bilateral TM wnl, oropharynx normal.  PEERL,EOMI.   Ears and Nose: No external deformity. CV: RRR, No M/G/R. No JVD. No thrill. No extra heart sounds. PULM: CTA B, no wheezes, crackles, rhonchi. No retractions. No resp. distress. No accessory muscle use. ABD: S, NT, ND, +BS. No rebound. No HSM. EXTR: No c/c/e NEURO Normal gait.  PSYCH: Normally interactive. Conversant. Not depressed or anxious appearing.  Calm demeanor.    Assessment and Plan: Physical exam  Essential hypertension - Plan: CBC, lisinopril (PRINIVIL,ZESTRIL) 20 MG tablet  Mixed hyperlipidemia - Plan: Lipid panel, pravastatin (PRAVACHOL) 40 MG tablet  Controlled type 2 diabetes mellitus without complication, without long-term current use of insulin (HCC) - Plan: Comprehensive metabolic panel, Hemoglobin A1c, Microalbumin / creatinine urine ratio, metFORMIN (GLUCOPHAGE) 500 MG tablet  Screening for prostate cancer - Plan: PSA  Medication monitoring encounter  Immunization  due  Depression, major, single episode, mild (HCC) - Plan: FLUoxetine (PROZAC) 20 MG tablet  Gastroesophageal reflux disease, esophagitis presence not specified - Plan: H. pylori breath test  CPE today Refilled medications and ordered labs for him today  He would like to taper off his prozac as he is doing well- that is fine with me Await labs and will be in touch with him today  Signed Abbe Amsterdam, MD  Received his labs  Results for orders placed or performed in visit on 05/29/18  CBC  Result Value Ref Range   WBC 6.4 4.0 - 10.5 K/uL   RBC 4.09 (L) 4.22 - 5.81 Mil/uL   Platelets 261.0 150.0 - 400.0 K/uL   Hemoglobin 14.0 13.0 - 17.0 g/dL   HCT 16.1 09.6 - 04.5 %   MCV 96.9 78.0 - 100.0 fl   MCHC 35.3 30.0 - 36.0 g/dL   RDW 40.9 81.1 - 91.4 %  Comprehensive metabolic panel  Result Value Ref Range   Sodium 136 135 - 145 mEq/L   Potassium 4.0 3.5 - 5.1 mEq/L   Chloride 102 96 - 112 mEq/L   CO2 25 19 - 32 mEq/L   Glucose, Bld 310 (H) 70 - 99 mg/dL   BUN 15 6 - 23 mg/dL   Creatinine, Ser 7.82 0.40 - 1.50 mg/dL   Total Bilirubin 0.6 0.2 - 1.2 mg/dL   Alkaline Phosphatase 82 39 - 117 U/L   AST 13 0 - 37 U/L   ALT 18 0 - 53 U/L   Total Protein 7.0 6.0 - 8.3 g/dL   Albumin 4.3 3.5 - 5.2 g/dL   Calcium 8.8 8.4 - 95.6 mg/dL   GFR 21.30 >86.57 mL/min  Hemoglobin A1c  Result Value Ref Range   Hgb A1c MFr Bld 9.1 (H) 4.6 - 6.5 %  Lipid panel  Result Value Ref Range   Cholesterol 189 0 - 200 mg/dL   Triglycerides 045.4 0.0 - 149.0 mg/dL   HDL 09.81 (L) >19.14 mg/dL   VLDL 78.2 0.0 - 95.6 mg/dL   LDL Cholesterol 213 (H) 0 - 99 mg/dL   Total CHOL/HDL Ratio 5    NonHDL 150.33   PSA  Result Value Ref Range   PSA 2.52 0.10 - 4.00 ng/mL  Microalbumin / creatinine urine ratio  Result Value Ref Range   Microalb, Ur 5.3 (H) 0.0 - 1.9 mg/dL   Creatinine,U 086.5 mg/dL   Microalb Creat Ratio 2.8 0.0 - 30.0 mg/g   Message to pt Blood counts are fine Metabolic profile is  normal except your glucose is high I was surprised to see your A1c so high-  Also metformin came off your med list for some reason. Are you still taking it? If so we likely need to add a second drug.  Please let me know here! Cholesterol is under reasonable control- I would however suggest that we use a low dose of a statin to increase your HDL. What do you think?  You have some microscopic protein in your urine.  We will continue to monitor this  Lab Results      Component                Value               Date                      PSA                      2.52                05/29/2018                PSA                      3.11                05/16/2017                PSA                      1.63                04/01/2016           Your PSA is down some from last year and stable from 2016.  Let's plan to repeat in 6 months so we can follow a bit more closely   Let me know about your metformin and your thoughts about a cholesterol med Take care!  7/23 Pt replied to me- he is taking metformin 500 and pravachol 40, will not show up on med list for some reason.   Replied to him again-   Hi Chad Cox- yes, of course you are taking metformin and pravachol!  Thank you for reminding me.  My EMR was not functioning correctly last night and was not allowing me to see my notes like normal- I should have just stopped working, serves me right!  Anyway, let's do this-  Will try increasing metformin from 500 mg once a day to 2000 mg twice a day- however we will want to do this gradually Increase first to 1 pill twice a day for one week Then 1 pill am, 2  pills pm for one week Then 2 pills twice a day.    You may have some GI upset.  If this is severe or too bothersome let me know and we can re-assess Let's increase your pravachol to 80 mg in hopes of improving your cholesterol a little more  Continuing to work on diet and exercise!   Let's visit in 4-6 months to see where we are.  Can repeat PSA  then as well.    Take care and let me know if any questions

## 2018-05-29 ENCOUNTER — Encounter: Payer: Self-pay | Admitting: Family Medicine

## 2018-05-29 ENCOUNTER — Ambulatory Visit (INDEPENDENT_AMBULATORY_CARE_PROVIDER_SITE_OTHER): Payer: BLUE CROSS/BLUE SHIELD | Admitting: Family Medicine

## 2018-05-29 VITALS — BP 122/78 | HR 86 | Temp 98.3°F | Resp 16 | Ht 72.0 in | Wt 243.0 lb

## 2018-05-29 DIAGNOSIS — E782 Mixed hyperlipidemia: Secondary | ICD-10-CM

## 2018-05-29 DIAGNOSIS — K219 Gastro-esophageal reflux disease without esophagitis: Secondary | ICD-10-CM

## 2018-05-29 DIAGNOSIS — I10 Essential (primary) hypertension: Secondary | ICD-10-CM

## 2018-05-29 DIAGNOSIS — Z0000 Encounter for general adult medical examination without abnormal findings: Secondary | ICD-10-CM

## 2018-05-29 DIAGNOSIS — F320 Major depressive disorder, single episode, mild: Secondary | ICD-10-CM

## 2018-05-29 DIAGNOSIS — E119 Type 2 diabetes mellitus without complications: Secondary | ICD-10-CM

## 2018-05-29 DIAGNOSIS — Z125 Encounter for screening for malignant neoplasm of prostate: Secondary | ICD-10-CM | POA: Diagnosis not present

## 2018-05-29 DIAGNOSIS — Z5181 Encounter for therapeutic drug level monitoring: Secondary | ICD-10-CM | POA: Diagnosis not present

## 2018-05-29 DIAGNOSIS — Z23 Encounter for immunization: Secondary | ICD-10-CM

## 2018-05-29 LAB — COMPREHENSIVE METABOLIC PANEL
ALT: 18 U/L (ref 0–53)
AST: 13 U/L (ref 0–37)
Albumin: 4.3 g/dL (ref 3.5–5.2)
Alkaline Phosphatase: 82 U/L (ref 39–117)
BUN: 15 mg/dL (ref 6–23)
CO2: 25 mEq/L (ref 19–32)
Calcium: 8.8 mg/dL (ref 8.4–10.5)
Chloride: 102 mEq/L (ref 96–112)
Creatinine, Ser: 1.01 mg/dL (ref 0.40–1.50)
GFR: 78.43 mL/min (ref >60.00)
Glucose, Bld: 310 mg/dL — ABNORMAL HIGH (ref 70–99)
Potassium: 4 mEq/L (ref 3.5–5.1)
Sodium: 136 mEq/L (ref 135–145)
Total Bilirubin: 0.6 mg/dL (ref 0.2–1.2)
Total Protein: 7 g/dL (ref 6.0–8.3)

## 2018-05-29 LAB — LIPID PANEL
Cholesterol: 189 mg/dL (ref 0–200)
HDL: 38.2 mg/dL — ABNORMAL LOW (ref >39.00)
LDL Cholesterol: 123 mg/dL — ABNORMAL HIGH (ref 0–99)
NonHDL: 150.33
Total CHOL/HDL Ratio: 5
Triglycerides: 138 mg/dL (ref 0.0–149.0)
VLDL: 27.6 mg/dL (ref 0.0–40.0)

## 2018-05-29 LAB — CBC
HCT: 39.7 % (ref 39.0–52.0)
Hemoglobin: 14 g/dL (ref 13.0–17.0)
MCHC: 35.3 g/dL (ref 30.0–36.0)
MCV: 96.9 fl (ref 78.0–100.0)
Platelets: 261 10*3/uL (ref 150.0–400.0)
RBC: 4.09 Mil/uL — ABNORMAL LOW (ref 4.22–5.81)
RDW: 13.2 % (ref 11.5–15.5)
WBC: 6.4 10*3/uL (ref 4.0–10.5)

## 2018-05-29 LAB — HEMOGLOBIN A1C: Hgb A1c MFr Bld: 9.1 % — ABNORMAL HIGH (ref 4.6–6.5)

## 2018-05-29 LAB — MICROALBUMIN / CREATININE URINE RATIO
Creatinine,U: 184.6 mg/dL
Microalb Creat Ratio: 2.8 mg/g (ref 0.0–30.0)
Microalb, Ur: 5.3 mg/dL — ABNORMAL HIGH (ref 0.0–1.9)

## 2018-05-29 LAB — PSA: PSA: 2.52 ng/mL (ref 0.10–4.00)

## 2018-05-29 MED ORDER — PRAVASTATIN SODIUM 40 MG PO TABS: ORAL_TABLET | ORAL | 3 refills | Status: DC

## 2018-05-29 MED ORDER — FLUOXETINE HCL 20 MG PO TABS: 40.0000 mg | ORAL_TABLET | Freq: Every day | ORAL | 3 refills | Status: DC

## 2018-05-29 MED ORDER — LISINOPRIL 20 MG PO TABS: 20.0000 mg | ORAL_TABLET | Freq: Every day | ORAL | 3 refills | Status: DC

## 2018-05-29 MED ORDER — METFORMIN HCL 500 MG PO TABS: ORAL_TABLET | ORAL | 3 refills | Status: DC

## 2018-05-29 NOTE — Patient Instructions (Signed)
Good to see you today as always!  I will be in touch with your labs asap We will do an H pylori test today to look for H pylori bacteria, a common cause of indigestion. If this is positive we will treat for this infection Also, be cautious with NSAID medications- tylenol is easier on your stomach for routine aches and pains You can cut down to 20 mg of prozac for a month, then go to a 1/2 tablet for a couple of weeks and then stop. If you need to refill and continue to take it that is also fine, just let me know!    Health Maintenance, Male A healthy lifestyle and preventive care is important for your health and wellness. Ask your health care provider about what schedule of regular examinations is right for you. What should I know about weight and diet? Eat a Healthy Diet  Eat plenty of vegetables, fruits, whole grains, low-fat dairy products, and lean protein.  Do not eat a lot of foods high in solid fats, added sugars, or salt.  Maintain a Healthy Weight Regular exercise can help you achieve or maintain a healthy weight. You should:  Do at least 150 minutes of exercise each week. The exercise should increase your heart rate and make you sweat (moderate-intensity exercise).  Do strength-training exercises at least twice a week.  Watch Your Levels of Cholesterol and Blood Lipids  Have your blood tested for lipids and cholesterol every 5 years starting at 66 years of age. If you are at high risk for heart disease, you should start having your blood tested when you are 66 years old. You may need to have your cholesterol levels checked more often if: ? Your lipid or cholesterol levels are high. ? You are older than 66 years of age. ? You are at high risk for heart disease.  What should I know about cancer screening? Many types of cancers can be detected early and may often be prevented. Lung Cancer  You should be screened every year for lung cancer if: ? You are a current smoker who  has smoked for at least 30 years. ? You are a former smoker who has quit within the past 15 years.  Talk to your health care provider about your screening options, when you should start screening, and how often you should be screened.  Colorectal Cancer  Routine colorectal cancer screening usually begins at 66 years of age and should be repeated every 5-10 years until you are 66 years old. You may need to be screened more often if early forms of precancerous polyps or small growths are found. Your health care provider may recommend screening at an earlier age if you have risk factors for colon cancer.  Your health care provider may recommend using home test kits to check for hidden blood in the stool.  A small camera at the end of a tube can be used to examine your colon (sigmoidoscopy or colonoscopy). This checks for the earliest forms of colorectal cancer.  Prostate and Testicular Cancer  Depending on your age and overall health, your health care provider may do certain tests to screen for prostate and testicular cancer.  Talk to your health care provider about any symptoms or concerns you have about testicular or prostate cancer.  Skin Cancer  Check your skin from head to toe regularly.  Tell your health care provider about any new moles or changes in moles, especially if: ? There is a change  in a mole's size, shape, or color. ? You have a mole that is larger than a pencil eraser.  Always use sunscreen. Apply sunscreen liberally and repeat throughout the day.  Protect yourself by wearing long sleeves, pants, a wide-brimmed hat, and sunglasses when outside.  What should I know about heart disease, diabetes, and high blood pressure?  If you are 36-32 years of age, have your blood pressure checked every 3-5 years. If you are 3 years of age or older, have your blood pressure checked every year. You should have your blood pressure measured twice-once when you are at a hospital or  clinic, and once when you are not at a hospital or clinic. Record the average of the two measurements. To check your blood pressure when you are not at a hospital or clinic, you can use: ? An automated blood pressure machine at a pharmacy. ? A home blood pressure monitor.  Talk to your health care provider about your target blood pressure.  If you are between 66-21 years old, ask your health care provider if you should take aspirin to prevent heart disease.  Have regular diabetes screenings by checking your fasting blood sugar level. ? If you are at a normal weight and have a low risk for diabetes, have this test once every three years after the age of 64. ? If you are overweight and have a high risk for diabetes, consider being tested at a younger age or more often.  A one-time screening for abdominal aortic aneurysm (AAA) by ultrasound is recommended for men aged 65-75 years who are current or former smokers. What should I know about preventing infection? Hepatitis B If you have a higher risk for hepatitis B, you should be screened for this virus. Talk with your health care provider to find out if you are at risk for hepatitis B infection. Hepatitis C Blood testing is recommended for:  Everyone born from [birth year] through 1964.  Anyone with known risk factors for hepatitis C.  Sexually Transmitted Diseases (STDs)  You should be screened each year for STDs including gonorrhea and chlamydia if: ? You are sexually active and are younger than 66 years of age. ? You are older than 66 years of age and your health care provider tells you that you are at risk for this type of infection. ? Your sexual activity has changed since you were last screened and you are at an increased risk for chlamydia or gonorrhea. Ask your health care provider if you are at risk.  Talk with your health care provider about whether you are at high risk of being infected with HIV. Your health care provider may recommend a  prescription medicine to help prevent HIV infection.  What else can I do?  Schedule regular health, dental, and eye exams.  Stay current with your vaccines (immunizations).  Do not use any tobacco products, such as cigarettes, chewing tobacco, and e-cigarettes. If you need help quitting, ask your health care provider.  Limit alcohol intake to no more than 2 drinks per day. One drink equals 12 ounces of beer, 5 ounces of wine, or 1 ounces of hard liquor.  Do not use street drugs.  Do not share needles.  Ask your health care provider for help if you need support or information about quitting drugs.  Tell your health care provider if you often feel depressed.  Tell your health care provider if you have ever been abused or do not feel safe at home. This  information is not intended to replace advice given to you by your health care provider. Make sure you discuss any questions you have with your health care provider. Document Released: 04/22/2008 Document Revised: 06/23/2016 Document Reviewed: 07/29/2015 Elsevier Interactive Patient Education  [employer].

## 2018-05-30 ENCOUNTER — Encounter: Payer: Self-pay | Admitting: Family Medicine

## 2018-05-30 ENCOUNTER — Other Ambulatory Visit: Payer: Self-pay | Admitting: Family Medicine

## 2018-05-30 DIAGNOSIS — K219 Gastro-esophageal reflux disease without esophagitis: Secondary | ICD-10-CM

## 2018-05-30 LAB — H. PYLORI BREATH TEST: H. pylori Breath Test: NOT DETECTED

## 2018-05-30 MED ORDER — METFORMIN HCL 500 MG PO TABS: ORAL_TABLET | ORAL | 3 refills | Status: DC

## 2018-05-30 MED ORDER — PANTOPRAZOLE SODIUM 40 MG PO TBEC: 40.0000 mg | DELAYED_RELEASE_TABLET | Freq: Every day | ORAL | 0 refills | Status: DC

## 2018-05-30 MED ORDER — PRAVASTATIN SODIUM 80 MG PO TABS: ORAL_TABLET | ORAL | 3 refills | Status: DC

## 2018-06-14 ENCOUNTER — Other Ambulatory Visit: Payer: Self-pay | Admitting: Family Medicine

## 2018-06-14 DIAGNOSIS — F320 Major depressive disorder, single episode, mild: Secondary | ICD-10-CM

## 2018-06-14 DIAGNOSIS — E782 Mixed hyperlipidemia: Secondary | ICD-10-CM

## 2018-06-26 ENCOUNTER — Other Ambulatory Visit: Payer: Self-pay | Admitting: Family Medicine

## 2018-06-26 DIAGNOSIS — K219 Gastro-esophageal reflux disease without esophagitis: Secondary | ICD-10-CM

## 2018-07-09 ENCOUNTER — Emergency Department (HOSPITAL_COMMUNITY): Payer: BLUE CROSS/BLUE SHIELD

## 2018-07-09 ENCOUNTER — Emergency Department (HOSPITAL_COMMUNITY)
Admission: EM | Admit: 2018-07-09 | Discharge: 2018-07-09 | Disposition: A | Discharge status: Home / Self Care | Payer: BLUE CROSS/BLUE SHIELD | Attending: Emergency Medicine | Admitting: Emergency Medicine

## 2018-07-09 ENCOUNTER — Encounter (HOSPITAL_COMMUNITY): Payer: Self-pay | Admitting: Emergency Medicine

## 2018-07-09 ENCOUNTER — Other Ambulatory Visit: Payer: Self-pay

## 2018-07-09 DIAGNOSIS — Z7982 Long term (current) use of aspirin: Secondary | ICD-10-CM | POA: Diagnosis not present

## 2018-07-09 DIAGNOSIS — R1013 Epigastric pain: Secondary | ICD-10-CM | POA: Diagnosis present

## 2018-07-09 DIAGNOSIS — Z7984 Long term (current) use of oral hypoglycemic drugs: Secondary | ICD-10-CM | POA: Diagnosis not present

## 2018-07-09 DIAGNOSIS — K5730 Diverticulosis of large intestine without perforation or abscess without bleeding: Secondary | ICD-10-CM | POA: Diagnosis not present

## 2018-07-09 DIAGNOSIS — E119 Type 2 diabetes mellitus without complications: Secondary | ICD-10-CM | POA: Insufficient documentation

## 2018-07-09 DIAGNOSIS — Z79899 Other long term (current) drug therapy: Secondary | ICD-10-CM | POA: Diagnosis not present

## 2018-07-09 DIAGNOSIS — K8020 Calculus of gallbladder without cholecystitis without obstruction: Secondary | ICD-10-CM | POA: Insufficient documentation

## 2018-07-09 DIAGNOSIS — M549 Dorsalgia, unspecified: Secondary | ICD-10-CM | POA: Diagnosis not present

## 2018-07-09 DIAGNOSIS — I10 Essential (primary) hypertension: Secondary | ICD-10-CM | POA: Insufficient documentation

## 2018-07-09 DIAGNOSIS — K760 Fatty (change of) liver, not elsewhere classified: Secondary | ICD-10-CM | POA: Diagnosis not present

## 2018-07-09 LAB — CBC
HCT: 37.4 % — ABNORMAL LOW (ref 39.0–52.0)
Hemoglobin: 13.6 g/dL (ref 13.0–17.0)
MCH: 34.3 pg — ABNORMAL HIGH (ref 26.0–34.0)
MCHC: 36.4 g/dL — ABNORMAL HIGH (ref 30.0–36.0)
MCV: 94.2 fL (ref 78.0–100.0)
Platelets: 244 10*3/uL (ref 150–400)
RBC: 3.97 MIL/uL — ABNORMAL LOW (ref 4.22–5.81)
RDW: 13 % (ref 11.5–15.5)
WBC: 10 10*3/uL (ref 4.0–10.5)

## 2018-07-09 LAB — I-STAT CHEM 8, ED
BUN: 14 mg/dL (ref 8–23)
Calcium, Ion: 1.13 mmol/L — ABNORMAL LOW (ref 1.15–1.40)
Chloride: 105 mmol/L (ref 98–111)
Creatinine, Ser: 0.9 mg/dL (ref 0.61–1.24)
Glucose, Bld: 327 mg/dL — ABNORMAL HIGH (ref 70–99)
HCT: 37 % — ABNORMAL LOW (ref 39.0–52.0)
Hemoglobin: 12.6 g/dL — ABNORMAL LOW (ref 13.0–17.0)
Potassium: 3.8 mmol/L (ref 3.5–5.1)
Sodium: 139 mmol/L (ref 135–145)
TCO2: 20 mmol/L — ABNORMAL LOW (ref 22–32)

## 2018-07-09 LAB — COMPREHENSIVE METABOLIC PANEL
ALT: 23 U/L (ref 0–44)
AST: 25 U/L (ref 15–41)
Albumin: 4.5 g/dL (ref 3.5–5.0)
Alkaline Phosphatase: 54 U/L (ref 38–126)
Anion gap: 14 (ref 5–15)
BUN: 15 mg/dL (ref 8–23)
CO2: 20 mmol/L — ABNORMAL LOW (ref 22–32)
Calcium: 9.9 mg/dL (ref 8.9–10.3)
Chloride: 108 mmol/L (ref 98–111)
Creatinine, Ser: 0.98 mg/dL (ref 0.61–1.24)
GFR calc Af Amer: >60 mL/min (ref >60)
GFR calc non Af Amer: >60 mL/min (ref >60)
Glucose, Bld: 322 mg/dL — ABNORMAL HIGH (ref 70–99)
Potassium: 3.9 mmol/L (ref 3.5–5.1)
Sodium: 142 mmol/L (ref 135–145)
Total Bilirubin: 0.7 mg/dL (ref 0.3–1.2)
Total Protein: 7.4 g/dL (ref 6.5–8.1)

## 2018-07-09 LAB — URINALYSIS, ROUTINE W REFLEX MICROSCOPIC
Bacteria, UA: NONE SEEN
Bilirubin Urine: NEGATIVE
Glucose, UA: >=500 mg/dL — AB
Hgb urine dipstick: NEGATIVE
Ketones, ur: 20 mg/dL — AB
Leukocytes, UA: NEGATIVE
Nitrite: NEGATIVE
Protein, ur: NEGATIVE mg/dL
Specific Gravity, Urine: >1.046 — ABNORMAL HIGH (ref 1.005–1.030)
pH: 7 (ref 5.0–8.0)

## 2018-07-09 LAB — DIFFERENTIAL
Basophils Absolute: 0 10*3/uL (ref 0.0–0.1)
Basophils Relative: 0 %
Eosinophils Absolute: 0.2 10*3/uL (ref 0.0–0.7)
Eosinophils Relative: 2 %
Lymphocytes Relative: 10 %
Lymphs Abs: 1 10*3/uL (ref 0.7–4.0)
Monocytes Absolute: 0.5 10*3/uL (ref 0.1–1.0)
Monocytes Relative: 5 %
Neutro Abs: 8.2 10*3/uL — ABNORMAL HIGH (ref 1.7–7.7)
Neutrophils Relative %: 83 %

## 2018-07-09 LAB — I-STAT CG4 LACTIC ACID, ED: Lactic Acid, Venous: 3.46 mmol/L (ref 0.5–1.9)

## 2018-07-09 LAB — LIPASE, BLOOD: Lipase: 42 U/L (ref 11–51)

## 2018-07-09 LAB — CBG MONITORING, ED: Glucose-Capillary: 272 mg/dL — ABNORMAL HIGH (ref 70–99)

## 2018-07-09 MED ORDER — IOPAMIDOL (ISOVUE-370) INJECTION 76%
INTRAVENOUS | Status: AC
  Filled 2018-07-09: qty 100

## 2018-07-09 MED ORDER — SODIUM CHLORIDE 0.9 % IV BOLUS
500.0000 mL | Freq: Once | INTRAVENOUS | Status: AC
  Administered 2018-07-09: 500 mL via INTRAVENOUS

## 2018-07-09 MED ORDER — IOPAMIDOL (ISOVUE-370) INJECTION 76%
100.0000 mL | Freq: Once | INTRAVENOUS | Status: AC | PRN
  Administered 2018-07-09: 100 mL via INTRAVENOUS

## 2018-07-09 MED ORDER — ONDANSETRON HCL 4 MG/2ML IJ SOLN
4.0000 mg | Freq: Once | INTRAMUSCULAR | Status: AC
  Administered 2018-07-09: 4 mg via INTRAVENOUS
  Filled 2018-07-09: qty 2

## 2018-07-09 MED ORDER — HYDROCODONE-ACETAMINOPHEN 5-325 MG PO TABS: 1.0000 | ORAL_TABLET | ORAL | 0 refills | Status: DC | PRN

## 2018-07-09 MED ORDER — HYDROMORPHONE HCL 1 MG/ML IJ SOLN
1.0000 mg | Freq: Once | INTRAMUSCULAR | Status: AC
  Administered 2018-07-09: 1 mg via INTRAVENOUS
  Filled 2018-07-09: qty 1

## 2018-07-09 NOTE — ED Notes (Signed)
ED Provider at bedside. 

## 2018-07-09 NOTE — ED Provider Notes (Signed)
[facility]-EMERGENCY DEPT Provider Note   CSN: [id number] Arrival date & time: 07/09/18  0410     History   Chief Complaint Chief Complaint  Patient presents with  . Abdominal Pain  . Back Pain    HPI Chad Cox is a 67 y.o. male.  Patient presents to the emergency department for evaluation of abdominal pain.  Patient reports that he had onset of mid abdominal pain after eating earlier today.  Pain is progressively worsened and now is severe, sharp and stabbing.  It radiates straight through his back and then up into his right shoulder blade.  He is not expensing any shortness of breath.  No chest pain.  He has had nausea and vomited twice.  No diarrhea or constipation.  This does not feel like kidney stones that he has had in the past.     Past Medical History:  Diagnosis Date  . Diabetes mellitus without complication (HCC)   . Hypertension   . Kidney stone     Patient Active Problem List   Diagnosis Date Noted  . Obesity, unspecified 04/29/2014  . Diabetes mellitus 01/30/2012  . Hypertension 01/30/2012  . Dyslipidemia 01/30/2012    History reviewed. No pertinent surgical history.      Home Medications    Prior to Admission medications   Medication Sig Start Date End Date Taking? Authorizing Provider  acetaminophen (TYLENOL) 650 MG CR tablet Take 1,300 mg by mouth every 8 (eight) hours as needed for pain.   Yes [provider]  aspirin 81 MG tablet Take 81 mg by mouth daily.   Yes [provider]  cyclobenzaprine (FLEXERIL) 5 MG tablet Take 1 tablet (5 mg total) by mouth daily as needed. Patient taking differently: Take 5 mg by mouth daily as needed for muscle spasms.  03/30/17  Yes Copland, Gwenlyn Found, MD  FLUoxetine (PROZAC) 20 MG tablet Take 2 tablets (40 mg total) by mouth daily. Patient taking differently: Take 20 mg by mouth daily.  05/29/18  Yes Copland, Gwenlyn Found, MD  lisinopril (PRINIVIL,ZESTRIL) 20 MG tablet  Take 1 tablet (20 mg total) by mouth daily. 05/29/18  Yes Copland, Gwenlyn Found, MD  metFORMIN (GLUCOPHAGE) 500 MG tablet Take 2 pills twice daily Patient taking differently: Take 1,000 mg by mouth 2 (two) times daily with a meal.  05/30/18  Yes Copland, Gwenlyn Found, MD  Multiple Vitamin (MULTIVITAMIN) tablet Take 1 tablet by mouth daily.   Yes [provider]  ondansetron (ZOFRAN ODT) 8 MG disintegrating tablet Take 1 tablet (8 mg total) by mouth every 8 (eight) hours as needed for nausea or vomiting. 03/28/17  Yes Saguier, Ramon Dredge, PA-C  pravastatin (PRAVACHOL) 80 MG tablet Take 1 by mouth daily Patient taking differently: Take 80 mg by mouth daily.  05/30/18  Yes Copland, Gwenlyn Found, MD  ranitidine (ZANTAC) 150 MG tablet Take 150 mg by mouth daily as needed for heartburn.    Yes [provider]  HYDROcodone-acetaminophen (NORCO/VICODIN) 5-325 MG tablet Take 1 tablet by mouth every 4 (four) hours as needed for moderate pain. 07/09/18   Gilda Crease, MD  naproxen sodium (ANAPROX) 220 MG tablet Take 2 tablets (440 mg total) by mouth 2 (two) times daily as needed (pain). Patient not taking: Reported on 07/09/2018 03/30/17   Copland, Gwenlyn Found, MD  pantoprazole (PROTONIX) 40 MG tablet TAKE 1 TABLET(40 MG) BY MOUTH DAILY Patient not taking: No sig reported 06/26/18   Copland, Gwenlyn Found, MD  Family History Family History  Problem Relation Age of Onset  . Atrial fibrillation Mother     Social History Social History   Tobacco Use  . Smoking status: Never Smoker  . Smokeless tobacco: Never Used  Substance Use Topics  . Alcohol use: No  . Drug use: No     Allergies   Adhesive [tape] and Betadine [povidone iodine]   Review of Systems Review of Systems  Gastrointestinal: Positive for abdominal pain, nausea and vomiting.  Musculoskeletal: Positive for back pain.  All other systems reviewed and are negative.    Physical Exam Updated Vital Signs BP 106/61   Pulse 65    Temp 98.6 F (37 C) (Oral)   Resp 13   Ht 5\' 10"  (1.778 m)   Wt 111.1 kg   SpO2 93%   BMI 35.15 kg/m   Physical Exam  Constitutional: He is oriented to person, place, and time. He appears well-developed and well-nourished. He appears distressed.  HENT:  Head: Normocephalic and atraumatic.  Right Ear: Hearing normal.  Left Ear: Hearing normal.  Nose: Nose normal.  Mouth/Throat: Oropharynx is clear and moist and mucous membranes are normal.  Eyes: Pupils are equal, round, and reactive to light. Conjunctivae and EOM are normal.  Neck: Normal range of motion. Neck supple.  Cardiovascular: Regular rhythm, S1 normal and S2 normal. Exam reveals no gallop and no friction rub.  No murmur heard. Pulmonary/Chest: Effort normal and breath sounds normal. No respiratory distress. He exhibits no tenderness.  Abdominal: Soft. Normal appearance and bowel sounds are normal. There is no hepatosplenomegaly. There is tenderness in the epigastric area. There is no rebound, no guarding, no tenderness at McBurney's point and negative Murphy's sign. No hernia.  Musculoskeletal: Normal range of motion.  Neurological: He is alert and oriented to person, place, and time. He has normal strength. No cranial nerve deficit or sensory deficit. Coordination normal. GCS eye subscore is 4. GCS verbal subscore is 5. GCS motor subscore is 6.  Skin: Skin is warm, dry and intact. No rash noted. No cyanosis.  Psychiatric: He has a normal mood and affect. His speech is normal and behavior is normal. Thought content normal.  Nursing note and vitals reviewed.    ED Treatments / Results  Labs (all labs ordered are listed, but only abnormal results are displayed) Labs Reviewed  COMPREHENSIVE METABOLIC PANEL - Abnormal; Notable for the following components:      Result Value   CO2 20 (*)    Glucose, Bld 322 (*)    All other components within normal limits  CBC - Abnormal; Notable for the following components:   RBC 3.97  (*)    HCT 37.4 (*)    MCH 34.3 (*)    MCHC 36.4 (*)    All other components within normal limits  URINALYSIS, ROUTINE W REFLEX MICROSCOPIC - Abnormal; Notable for the following components:   Specific Gravity, Urine >1.046 (*)    Glucose, UA >=500 (*)    Ketones, ur 20 (*)    All other components within normal limits  DIFFERENTIAL - Abnormal; Notable for the following components:   Neutro Abs 8.2 (*)    All other components within normal limits  I-STAT CG4 LACTIC ACID, ED - Abnormal; Notable for the following components:   Lactic Acid, Venous 3.46 (*)    All other components within normal limits  I-STAT CHEM 8, ED - Abnormal; Notable for the following components:   Glucose, Bld 327 (*)  Calcium, Ion 1.13 (*)    TCO2 20 (*)    Hemoglobin 12.6 (*)    HCT 37.0 (*)    All other components within normal limits  CBG MONITORING, ED - Abnormal; Notable for the following components:   Glucose-Capillary 272 (*)    All other components within normal limits  LIPASE, BLOOD    EKG EKG Interpretation  Date/Time:  Sunday July 09 2018 04:27:10 EDT Ventricular Rate:  71 PR Interval:    QRS Duration: 95 QT Interval:  386 QTC Calculation: 420 R Axis:   -22 Text Interpretation:  Sinus rhythm Ventricular premature complex Borderline left axis deviation Baseline wander in lead(s) I II aVR V1 No significant change since last tracing Confirmed by Gilda Crease [phone]) on 07/09/2018 4:33:22 AM Also confirmed by Gilda Crease [phone]), editor Sheppard Evens ([id number])  on 07/09/2018 9:36:46 AM   Radiology Ct Angio Chest/abd/pel For Dissection W &/or Wo Contrast  Result Date: 07/09/2018 CLINICAL DATA:  67 year old male with back pain. Concern for aortic dissection. EXAM: CT ANGIOGRAPHY CHEST, ABDOMEN AND PELVIS TECHNIQUE: Multidetector CT imaging through the chest, abdomen and pelvis was performed using the standard protocol during bolus administration of intravenous contrast.  Multiplanar reconstructed images and MIPs were obtained and reviewed to evaluate the vascular anatomy. CONTRAST:  ISOVUE-370 IOPAMIDOL (ISOVUE-370) INJECTION 76% COMPARISON:  CT of the abdomen pelvis dated 03/28/2017 FINDINGS: CTA CHEST FINDINGS Cardiovascular: Top-normal cardiac size. No pericardial effusion. The thoracic aorta is unremarkable. There is diminutive appearance of the visualized right vertebral artery. The remainder of the visualized great vessels of the aortic arch appear patent. There is no CT evidence of pulmonary embolism. Mediastinum/Nodes: No hilar or mediastinal adenopathy. Esophagus is grossly unremarkable. No mediastinal fluid collection. Lungs/Pleura: Minimal dependent subpleural atelectasis. There is no focal consolidation, pleural effusion, or pneumothorax. The central airways are patent. Musculoskeletal: No chest wall abnormality. No acute or significant osseous findings. Review of the MIP images confirms the above findings. CTA ABDOMEN AND PELVIS FINDINGS VASCULAR Aorta: Minimal atherosclerotic disease. No aneurysmal dilatation or dissection. Celiac: Patent without evidence of aneurysm, dissection, vasculitis or significant stenosis. SMA: Patent without evidence of aneurysm, dissection, vasculitis or significant stenosis. Renals: Both renal arteries are patent without evidence of aneurysm, dissection, vasculitis, fibromuscular dysplasia or significant stenosis. IMA: Patent without evidence of aneurysm, dissection, vasculitis or significant stenosis. Inflow: Patent without evidence of aneurysm, dissection, vasculitis or significant stenosis. Veins: No obvious venous abnormality within the limitations of this arterial phase study. Review of the MIP images confirms the above findings. NON-VASCULAR No intra-abdominal free air or free fluid. Hepatobiliary: The liver is unremarkable. No intrahepatic biliary ductal dilatation. There multiple small stones within the gallbladder. No  pericholecystic fluid or evidence of acute cholecystitis by CT. Pancreas: Unremarkable. No pancreatic ductal dilatation or surrounding inflammatory changes. Spleen: Normal in size without focal abnormality. Adrenals/Urinary Tract: The adrenal glands are unremarkable. Enhancing vessel versus a 3 mm nonobstructing right renal interpolar calculus. There is no hydronephrosis on either side. The visualized ureters and urinary bladder appear unremarkable. Stomach/Bowel: There is sigmoid diverticulosis with muscular hypertrophy. No active inflammatory changes. There are scattered colonic diverticula. There is no bowel obstruction or active inflammation. Normal appendix. Small duodenal diverticulum also noted. Lymphatic: Mildly enlarged lymph node in the posterior pelvis (series 5, image 164) measures 11 mm similar to the study of 2018 of indeterminate clinical significance or etiology. Clinical correlation is recommended. No new adenopathy. Reproductive: The prostate and seminal vesicles are grossly unremarkable. Other:  Small fat containing umbilical hernia. Musculoskeletal: Degenerative changes of the spine. No acute osseous pathology. Review of the MIP images confirms the above findings. IMPRESSION: 1. No acute intrathoracic, abdominal, or pelvic pathology. No aortic dissection or aneurysm. No CT evidence of pulmonary embolism. 2. Colonic diverticulosis. No bowel obstruction or active inflammation. Normal appendix. 3. Cholelithiasis. Electronically Signed   By: Elgie Collard M.D.   On: 07/09/2018 06:15   US Abdomen Limited Ruq  Result Date: 07/09/2018 CLINICAL DATA:  67 year old male with right upper quadrant pain radiating into the back EXAM: ULTRASOUND ABDOMEN LIMITED RIGHT UPPER QUADRANT COMPARISON:  Prior renal ultrasound 04/07/2017 CT scan of the chest performed today, 07/09/2018 FINDINGS: Gallbladder: Echogenic focus with subtle posterior acoustic shadowing in the gallbladder fundus measures approximately 2.1  cm and is consistent with a stone. Per the sonographer, the sonographic Eulah Pont sign was negative. No pericholecystic fluid or gallbladder wall thickening. Common bile duct: Diameter: Normal at 4 mm. Liver: Diffusely echogenic hepatic parenchyma with coarsening of the echotexture. The renal parenchyma appears hypoechoic in comparison. These findings are most consistent with hepatic steatosis. No discrete hepatic lesion or intrahepatic biliary ductal dilatation. Portal vein is patent on color Doppler imaging with normal direction of blood flow towards the liver. IMPRESSION: 1. Cholelithiasis without secondary sonographic findings of acute cholecystitis. 2. Hepatic steatosis. Electronically Signed   By: Malachy Moan M.D.   On: 07/09/2018 08:04    Procedures Procedures (including critical care time)  Medications Ordered in ED Medications  sodium chloride 0.9 % bolus 500 mL (0 mLs Intravenous Stopped 07/09/18 0548)  ondansetron (ZOFRAN) injection 4 mg (4 mg Intravenous Given 07/09/18 0447)  HYDROmorphone (DILAUDID) injection 1 mg (1 mg Intravenous Given 07/09/18 0447)  iopamidol (ISOVUE-370) 76 % injection 100 mL (100 mLs Intravenous Contrast Given 07/09/18 0525)     Initial Impression / Assessment and Plan / ED Course  I have reviewed the triage vital signs and the nursing notes.  Pertinent labs & imaging results that were available during my care of the patient were reviewed by me and considered in my medical decision making (see chart for details).     Patient presents to the emergency department for evaluation of abdominal pain radiating to the back.  Patient was in distress at arrival.  Reports pain is upper abdomen and radiates to the right shoulder blade.  Gallbladder disease was considered, however, at arrival his right upper quadrant was not tender to deep palpation.  With abdominal pain and back pain, aortic dissection was considered.  Patient underwent CT angios chest, abdomen, pelvis.  This  did not show any aortic abnormality.  He does have cholelithiasis, no other acute abnormality.  Patient administered IV Dilaudid and has had resolution of his pain.  He is now comfortable without complaints.  Right upper quadrant ultrasound without signs of acute cholecystitis.  Discharge with analgesia, follow-up with general surgery.  Final Clinical Impressions(s) / ED Diagnoses   Final diagnoses:  Calculus of gallbladder without cholecystitis without obstruction    ED Discharge Orders         Ordered    HYDROcodone-acetaminophen (NORCO/VICODIN) 5-325 MG tablet  Every 4 hours PRN     07/09/18 0808           Gilda Crease, MD 07/09/18 2357

## 2018-07-09 NOTE — ED Triage Notes (Signed)
Pt reports having pain to right shoulder blade with radiation to abdomen. Also reporting nausea and vomiting. Pt restless at time of triage.

## 2018-07-09 NOTE — ED Notes (Signed)
Patient transported to CT 

## 2018-07-11 ENCOUNTER — Encounter: Payer: Self-pay | Admitting: Family Medicine

## 2018-07-11 DIAGNOSIS — N200 Calculus of kidney: Secondary | ICD-10-CM

## 2018-07-11 MED ORDER — CYCLOBENZAPRINE HCL 5 MG PO TABS: 5.0000 mg | ORAL_TABLET | Freq: Every day | ORAL | 0 refills | Status: DC | PRN

## 2018-08-27 ENCOUNTER — Other Ambulatory Visit: Payer: Self-pay

## 2018-08-27 ENCOUNTER — Emergency Department (HOSPITAL_COMMUNITY): Payer: BLUE CROSS/BLUE SHIELD

## 2018-08-27 ENCOUNTER — Emergency Department (HOSPITAL_COMMUNITY)
Admission: EM | Admit: 2018-08-27 | Discharge: 2018-08-27 | Disposition: A | Discharge status: Home / Self Care | Payer: BLUE CROSS/BLUE SHIELD | Attending: Emergency Medicine | Admitting: Emergency Medicine

## 2018-08-27 ENCOUNTER — Encounter (HOSPITAL_COMMUNITY): Payer: Self-pay | Admitting: Emergency Medicine

## 2018-08-27 DIAGNOSIS — K8020 Calculus of gallbladder without cholecystitis without obstruction: Secondary | ICD-10-CM | POA: Insufficient documentation

## 2018-08-27 DIAGNOSIS — I10 Essential (primary) hypertension: Secondary | ICD-10-CM | POA: Diagnosis not present

## 2018-08-27 DIAGNOSIS — Z7984 Long term (current) use of oral hypoglycemic drugs: Secondary | ICD-10-CM | POA: Insufficient documentation

## 2018-08-27 DIAGNOSIS — E119 Type 2 diabetes mellitus without complications: Secondary | ICD-10-CM | POA: Insufficient documentation

## 2018-08-27 DIAGNOSIS — R1011 Right upper quadrant pain: Secondary | ICD-10-CM | POA: Diagnosis not present

## 2018-08-27 DIAGNOSIS — Z79899 Other long term (current) drug therapy: Secondary | ICD-10-CM | POA: Insufficient documentation

## 2018-08-27 DIAGNOSIS — Z7982 Long term (current) use of aspirin: Secondary | ICD-10-CM | POA: Insufficient documentation

## 2018-08-27 DIAGNOSIS — N200 Calculus of kidney: Secondary | ICD-10-CM

## 2018-08-27 DIAGNOSIS — R52 Pain, unspecified: Secondary | ICD-10-CM

## 2018-08-27 LAB — COMPREHENSIVE METABOLIC PANEL
ALT: 23 U/L (ref 0–44)
AST: 23 U/L (ref 15–41)
Albumin: 4.5 g/dL (ref 3.5–5.0)
Alkaline Phosphatase: 56 U/L (ref 38–126)
Anion gap: 11 (ref 5–15)
BUN: 17 mg/dL (ref 8–23)
CO2: 23 mmol/L (ref 22–32)
Calcium: 10.2 mg/dL (ref 8.9–10.3)
Chloride: 104 mmol/L (ref 98–111)
Creatinine, Ser: 0.92 mg/dL (ref 0.61–1.24)
GFR calc Af Amer: >60 mL/min (ref >60)
GFR calc non Af Amer: >60 mL/min (ref >60)
Glucose, Bld: 329 mg/dL — ABNORMAL HIGH (ref 70–99)
Potassium: 4 mmol/L (ref 3.5–5.1)
Sodium: 138 mmol/L (ref 135–145)
Total Bilirubin: 0.9 mg/dL (ref 0.3–1.2)
Total Protein: 7.4 g/dL (ref 6.5–8.1)

## 2018-08-27 LAB — CBC WITH DIFFERENTIAL/PLATELET
Abs Immature Granulocytes: 0.02 10*3/uL (ref 0.00–0.07)
Basophils Absolute: 0 10*3/uL (ref 0.0–0.1)
Basophils Relative: 0 %
Eosinophils Absolute: 0 10*3/uL (ref 0.0–0.5)
Eosinophils Relative: 0 %
HCT: 37.5 % — ABNORMAL LOW (ref 39.0–52.0)
Hemoglobin: 13.1 g/dL (ref 13.0–17.0)
Immature Granulocytes: 0 %
Lymphocytes Relative: 6 %
Lymphs Abs: 0.6 10*3/uL — ABNORMAL LOW (ref 0.7–4.0)
MCH: 33.4 pg (ref 26.0–34.0)
MCHC: 34.9 g/dL (ref 30.0–36.0)
MCV: 95.7 fL (ref 80.0–100.0)
Monocytes Absolute: 0.3 10*3/uL (ref 0.1–1.0)
Monocytes Relative: 3 %
Neutro Abs: 8.1 10*3/uL — ABNORMAL HIGH (ref 1.7–7.7)
Neutrophils Relative %: 91 %
Platelets: 234 10*3/uL (ref 150–400)
RBC: 3.92 MIL/uL — ABNORMAL LOW (ref 4.22–5.81)
RDW: 13 % (ref 11.5–15.5)
WBC: 8.9 10*3/uL (ref 4.0–10.5)
nRBC: 0 % (ref 0.0–0.2)

## 2018-08-27 LAB — URINALYSIS, ROUTINE W REFLEX MICROSCOPIC
Bacteria, UA: NONE SEEN
Bilirubin Urine: NEGATIVE
Glucose, UA: >=500 mg/dL — AB
Ketones, ur: 80 mg/dL — AB
Leukocytes, UA: NEGATIVE
Nitrite: NEGATIVE
Protein, ur: NEGATIVE mg/dL
Specific Gravity, Urine: 1.028 (ref 1.005–1.030)
pH: 7 (ref 5.0–8.0)

## 2018-08-27 LAB — LIPASE, BLOOD: Lipase: 29 U/L (ref 11–51)

## 2018-08-27 MED ORDER — ONDANSETRON HCL 4 MG/2ML IJ SOLN
4.0000 mg | Freq: Once | INTRAMUSCULAR | Status: AC
  Administered 2018-08-27: 4 mg via INTRAVENOUS
  Filled 2018-08-27: qty 2

## 2018-08-27 MED ORDER — FENTANYL CITRATE (PF) 100 MCG/2ML IJ SOLN
50.0000 ug | Freq: Once | INTRAMUSCULAR | Status: AC
  Administered 2018-08-27: 50 ug via INTRAVENOUS
  Filled 2018-08-27: qty 2

## 2018-08-27 MED ORDER — HYDROMORPHONE HCL 1 MG/ML IJ SOLN
1.0000 mg | Freq: Once | INTRAMUSCULAR | Status: AC
  Administered 2018-08-27: 1 mg via INTRAVENOUS
  Filled 2018-08-27: qty 1

## 2018-08-27 MED ORDER — LACTATED RINGERS IV BOLUS
1000.0000 mL | Freq: Once | INTRAVENOUS | Status: AC
  Administered 2018-08-27: 1000 mL via INTRAVENOUS

## 2018-08-27 MED ORDER — ONDANSETRON HCL 4 MG PO TABS: 4.0000 mg | ORAL_TABLET | Freq: Four times a day (QID) | ORAL | 0 refills | Status: AC

## 2018-08-27 MED ORDER — HYDROCODONE-ACETAMINOPHEN 5-325 MG PO TABS: 1.0000 | ORAL_TABLET | Freq: Four times a day (QID) | ORAL | 0 refills | Status: DC | PRN

## 2018-08-27 NOTE — ED Triage Notes (Signed)
Pt c/o RUQ abdominal pain and emesis. Pt states he was diagnoses with gall stones recently but has not made appt to have gall bladder removed.

## 2018-08-27 NOTE — ED Notes (Signed)
US currently at bedside

## 2018-08-27 NOTE — ED Provider Notes (Signed)
[facility]-EMERGENCY DEPT Provider Note   CSN: [id number] Arrival date & time: 08/27/18  0905     History   Chief Complaint Chief Complaint  Patient presents with  . Abdominal Pain  . Emesis    HPI Chad Cox is a 67 y.o. male.  The history is provided by the patient.  Abdominal Pain   This is a recurrent problem. The current episode started more than 1 week ago. The problem occurs every several days. The problem has not changed since onset.The pain is associated with eating. The pain is located in the RUQ. The quality of the pain is aching, dull and sharp. The pain is at a severity of 7/10. The pain is moderate. Associated symptoms include anorexia and nausea. Pertinent negatives include fever, belching, diarrhea, flatus, hematochezia, melena, vomiting, constipation, dysuria, frequency, hematuria, headaches, arthralgias and myalgias. The symptoms are aggravated by eating. Nothing relieves the symptoms. Past workup does not include surgery. His past medical history is significant for gallstones.    Past Medical History:  Diagnosis Date  . Diabetes mellitus without complication (HCC)   . Hypertension   . Kidney stone     Patient Active Problem List   Diagnosis Date Noted  . Obesity, unspecified 04/29/2014  . Diabetes mellitus 01/30/2012  . Hypertension 01/30/2012  . Dyslipidemia 01/30/2012    History reviewed. No pertinent surgical history.       Home Medications    Prior to Admission medications   Medication Sig Start Date End Date Taking? Authorizing Provider  acetaminophen (TYLENOL) 650 MG CR tablet Take 1,300 mg by mouth every 8 (eight) hours as needed for pain.   Yes [provider]  aspirin 81 MG tablet Take 81 mg by mouth daily.   Yes [provider]  cyclobenzaprine (FLEXERIL) 5 MG tablet Take 1 tablet (5 mg total) by mouth daily as needed for muscle spasms. 07/11/18  Yes Copland, Gwenlyn Found, MD  FLUoxetine (PROZAC)  20 MG tablet Take 2 tablets (40 mg total) by mouth daily. Patient taking differently: Take 20 mg by mouth daily.  05/29/18  Yes Copland, Gwenlyn Found, MD  HYDROcodone-acetaminophen (NORCO/VICODIN) 5-325 MG tablet Take 1 tablet by mouth every 4 (four) hours as needed for moderate pain. 07/09/18  Yes Pollina, Canary Brim, MD  lisinopril (PRINIVIL,ZESTRIL) 20 MG tablet Take 1 tablet (20 mg total) by mouth daily. 05/29/18  Yes Copland, Gwenlyn Found, MD  metFORMIN (GLUCOPHAGE) 500 MG tablet Take 2 pills twice daily Patient taking differently: Take 1,000 mg by mouth 2 (two) times daily with a meal.  05/30/18  Yes Copland, Gwenlyn Found, MD  Multiple Vitamin (MULTIVITAMIN) tablet Take 1 tablet by mouth daily.   Yes [provider]  pravastatin (PRAVACHOL) 80 MG tablet Take 1 by mouth daily Patient taking differently: Take 80 mg by mouth daily.  05/30/18  Yes Copland, Gwenlyn Found, MD  HYDROcodone-acetaminophen (NORCO/VICODIN) 5-325 MG tablet Take 1 tablet by mouth every 6 (six) hours as needed for up to 12 doses for moderate pain. 08/27/18   Jaimie Pippins, DO  naproxen sodium (ANAPROX) 220 MG tablet Take 2 tablets (440 mg total) by mouth 2 (two) times daily as needed (pain). Patient not taking: Reported on 07/09/2018 03/30/17   Copland, Gwenlyn Found, MD  ondansetron (ZOFRAN ODT) 8 MG disintegrating tablet Take 1 tablet (8 mg total) by mouth every 8 (eight) hours as needed for nausea or vomiting. Patient not taking: Reported on 08/27/2018 03/28/17   Esperanza Richters, PA-C  ondansetron (ZOFRAN) 4 MG tablet Take 1 tablet (4 mg total) by mouth every 6 (six) hours for 12 doses. 08/27/18 08/30/18  Gayatri Teasdale, DO  pantoprazole (PROTONIX) 40 MG tablet TAKE 1 TABLET(40 MG) BY MOUTH DAILY Patient not taking: No sig reported 06/26/18   Copland, Gwenlyn Found, MD    Family History Family History  Problem Relation Age of Onset  . Atrial fibrillation Mother     Social History Social History   Tobacco Use  . Smoking status:  Never Smoker  . Smokeless tobacco: Never Used  Substance Use Topics  . Alcohol use: No  . Drug use: No     Allergies   Adhesive [tape] and Betadine [povidone iodine]   Review of Systems Review of Systems  Constitutional: Negative for chills and fever.  HENT: Negative for ear pain and sore throat.   Eyes: Negative for pain and visual disturbance.  Respiratory: Negative for cough and shortness of breath.   Cardiovascular: Negative for chest pain and palpitations.  Gastrointestinal: Positive for abdominal pain, anorexia and nausea. Negative for abdominal distention, anal bleeding, blood in stool, constipation, diarrhea, flatus, hematochezia, melena, rectal pain and vomiting.  Genitourinary: Negative for dysuria, frequency and hematuria.  Musculoskeletal: Negative for arthralgias, back pain and myalgias.  Skin: Negative for color change and rash.  Neurological: Negative for seizures, syncope and headaches.  All other systems reviewed and are negative.    Physical Exam Updated Vital Signs  ED Triage Vitals  Enc Vitals Group     BP 08/27/18 0913 (!) 151/76     Pulse Rate 08/27/18 0913 74     Resp 08/27/18 0913 17     Temp 08/27/18 0913 97.7 F (36.5 C)     Temp Source 08/27/18 0913 Oral     SpO2 08/27/18 0913 100 %     Weight 08/27/18 0915 250 lb (113.4 kg)     Height --      Head Circumference --      Peak Flow --      Pain Score 08/27/18 0914 8     Pain Loc --      Pain Edu? --      Excl. in GC? --     Physical Exam  Constitutional: He appears well-developed and well-nourished.  HENT:  Head: Normocephalic and atraumatic.  Eyes: Pupils are equal, round, and reactive to light. Conjunctivae and EOM are normal.  Neck: Neck supple.  Cardiovascular: Normal rate, regular rhythm, normal heart sounds and intact distal pulses.  No murmur heard. Pulmonary/Chest: Effort normal and breath sounds normal. No respiratory distress.  Abdominal: Soft. Normal appearance and normal  aorta. There is tenderness in the right upper quadrant. There is positive Murphy's sign. There is no rigidity, no rebound, no guarding, no CVA tenderness and no tenderness at McBurney's point.  Musculoskeletal: He exhibits no edema.  Neurological: He is alert.  Skin: Skin is warm and dry. Capillary refill takes less than 2 seconds.  Psychiatric: He has a normal mood and affect.  Nursing note and vitals reviewed.    ED Treatments / Results  Labs (all labs ordered are listed, but only abnormal results are displayed) Labs Reviewed  COMPREHENSIVE METABOLIC PANEL - Abnormal; Notable for the following components:      Result Value   Glucose, Bld 329 (*)    All other components within normal limits  CBC WITH DIFFERENTIAL/PLATELET - Abnormal; Notable for the following components:   RBC 3.92 (*)    HCT 37.5 (*)  Neutro Abs 8.1 (*)    Lymphs Abs 0.6 (*)    All other components within normal limits  URINALYSIS, ROUTINE W REFLEX MICROSCOPIC - Abnormal; Notable for the following components:   Glucose, UA >=500 (*)    Hgb urine dipstick SMALL (*)    Ketones, ur 80 (*)    All other components within normal limits  LIPASE, BLOOD  CBG MONITORING, ED    EKG None  Radiology Ct Renal Stone Study  Result Date: 08/27/2018 CLINICAL DATA:  Right upper quadrant pain with vomiting. Gallstones. EXAM: CT ABDOMEN AND PELVIS WITHOUT CONTRAST TECHNIQUE: Multidetector CT imaging of the abdomen and pelvis was performed following the standard protocol without IV contrast. COMPARISON:  03/28/2017 FINDINGS: Lower chest: Mild bibasilar dependent atelectasis. Possible mild circumferential distal esophageal wall thickening which may be due to reflux esophagitis. Hepatobiliary: Mild cholelithiasis. Liver and biliary tree are normal. Pancreas: Normal. Spleen: Normal. Adrenals/Urinary Tract: Adrenal glands are normal. Kidneys are normal in size without hydronephrosis or focal mass. 3 mm nonobstructing stone over the  mid pole right kidney. Ureters and bladder are normal. Stomach/Bowel: Stomach and small bowel are unremarkable. Moderate diverticulosis of the colon most prominent over the descending and sigmoid colon. Appendix is normal. Vascular/Lymphatic: Mild calcified plaque over the abdominal aorta. Couple small mesenteric lymph nodes in the midline below the aortic bifurcation with the larger measuring 1.4 cm without significant change. Reproductive: Normal. Other: No free fluid or focal inflammatory change. Small umbilical hernia containing only peritoneal fat. Musculoskeletal: Degenerative change of the spine. Minimal degenerative change of the hips. IMPRESSION: No acute findings in the abdomen/pelvis. Mild cholelithiasis. Single nonobstructing 3 mm right renal stone. Moderate colonic diverticulosis. Small umbilical hernia containing only peritoneal fat. Aortic Atherosclerosis (ICD10-I70.0). Nonspecific 1.4 cm mesenteric lymph node in the midline lower abdomen unchanged. Electronically Signed   By: Elberta Fortis M.D.   On: 08/27/2018 11:59   US Abdomen Limited Ruq  Result Date: 08/27/2018 CLINICAL DATA:  67 year old male with a history of pain EXAM: ULTRASOUND ABDOMEN LIMITED RIGHT UPPER QUADRANT COMPARISON:  None. FINDINGS: Gallbladder: Echogenic debris in the dependent aspects of the gallbladder, with largest focus measuring 1.3 cm. Negative sonographic Murphy's sign. No gallbladder wall thickening or pericholecystic fluid. Common bile duct: Diameter: 3 mm-4 mm Liver: Heterogeneous echotexture of liver parenchyma. Portal vein is patent on color Doppler imaging with normal direction of blood flow towards the liver. IMPRESSION: Cholelithiasis without sonographic evidence of acute cholecystitis. Heterogeneous appearance of liver, potentially representing hepatic steatosis Electronically Signed   By: Gilmer Mor D.O.   On: 08/27/2018 10:18    Procedures Procedures (including critical care time)  Medications  Ordered in ED Medications  lactated ringers bolus 1,000 mL (0 mLs Intravenous Stopped 08/27/18 1051)  fentaNYL (SUBLIMAZE) injection 50 mcg (50 mcg Intravenous Given 08/27/18 0958)  ondansetron (ZOFRAN) injection 4 mg (4 mg Intravenous Given 08/27/18 0957)  HYDROmorphone (DILAUDID) injection 1 mg (1 mg Intravenous Given 08/27/18 1051)     Initial Impression / Assessment and Plan / ED Course  I have reviewed the triage vital signs and the nursing notes.  Pertinent labs & imaging results that were available during my care of the patient were reviewed by me and considered in my medical decision making (see chart for details).     Chad Cox is a 67 year old male with history of hypertension, diabetes, kidney stone who presents to the ED with right upper quadrant abdominal pain.  Patient with pain on and off for the last  several weeks.  Has normal vitals.  No fever.  Patient had right upper quadrant ultrasound over a month ago that showed gallstones.  States that he feels similar pain today.  Slightly worse when he eats.  Has tried to avoid fatty foods.  Has not followed up with surgery.  Patient denies any fever, chills.  Has had some nausea but no vomiting.  He is tender mostly in the right upper quadrant on exam.  No signs of peritonitis.  Positive Murphy sign.  Denies any chest pain, shortness of breath.  Concern for hepatobiliary disease, does have history of kidney stones as well. Patient to get labs, ultrasound of RUQ, CT renal study and will give IV fluids, IV fentanyl and IV zofan and will re-eval.   Patient with cholelithiasis but no signs of acute cholecystitis.  Gallbladder and liver enzymes within normal limits.  Lipase normal and doubt pancreatitis.  CT scan shows nonobstructing kidney stone in the right renal pole.  Patient has no signs of urinary tract infection.  Patient feels better after IV fluids and IV pain medicine.  Had discussion with the patient about possibly contacting  general surgery to see if they will take out gallbladder as this appears to be an ongoing issue.  However he rather follow-up outpatient as he does feel better at this time.  He was given information to follow-up with general surgery.  Patient does not have any active narcotic scripts on the [facility] and was given a short prescription for [employer].  Educated about its use.  Patient was given Zofran prescription as well.  Was given return precautions and discharged in ED good condition.  This chart was dictated using voice recognition software.  Despite best efforts to proofread,  errors can occur which can change the documentation meaning.   Final Clinical Impressions(s) / ED Diagnoses   Final diagnoses:  Pain  Calculus of gallbladder without cholecystitis without obstruction  Kidney stone    ED Discharge Orders         Ordered    ondansetron (ZOFRAN) 4 MG tablet  Every 6 hours     08/27/18 1238    HYDROcodone-acetaminophen (NORCO/VICODIN) 5-325 MG tablet  Every 6 hours PRN     08/27/18 1238           Chad Cassis, DO 08/27/18 1246

## 2018-08-30 ENCOUNTER — Other Ambulatory Visit: Payer: Self-pay | Admitting: General Surgery

## 2018-08-30 DIAGNOSIS — Z6831 Body mass index (BMI) 31.0-31.9, adult: Secondary | ICD-10-CM | POA: Diagnosis not present

## 2018-08-30 DIAGNOSIS — I10 Essential (primary) hypertension: Secondary | ICD-10-CM | POA: Diagnosis not present

## 2018-08-30 DIAGNOSIS — E119 Type 2 diabetes mellitus without complications: Secondary | ICD-10-CM | POA: Diagnosis not present

## 2018-08-30 DIAGNOSIS — K8000 Calculus of gallbladder with acute cholecystitis without obstruction: Secondary | ICD-10-CM | POA: Diagnosis not present

## 2018-08-31 ENCOUNTER — Encounter: Payer: Self-pay | Admitting: Family Medicine

## 2018-09-03 NOTE — Progress Notes (Signed)
[facility] [address] [place], [place] [address] [phone]  Date:  09/04/2018   Name:  Chad Cox   DOB:  [birth year]   MRN:  [id number]  PCP:  Pearline Cables, MD    Chief Complaint: Diabetes (glucose check, medication dose, havign sugery 11/13)   History of Present Illness:  Chad Cox is a 67 y.o. very pleasant male patient who presents with the following:  Following up on his DM today He has been in the ER with gallstone colic and is now scheduled for an ambulatory lap chole on 11/13 Supposed to be a day surgery per Dr. Derrell Lolling  There is concern about his blood sugar, surgeon would like this to be well controlled prior to operation  05/29/18- A1c 9.1%  His A1c had been fine until it went up this last July I had asked him to increase his metformin dose back in July- will need to repeat today and will need to add a 2nd drug if still too high  He does not check his glucose at home   He had an EKG on 10/20 which was normal  He notes that his RUQ pain seems to sometimes radiate into his right shoulder- this has been the case for the last 2 months  He would like to have a CXR which is reasonable  He does not have any CP, chest pressure or SOB   Eye exam: he last went about a month ago  Flu shot: he declines today   A couple of weeks ago he was at a dog rescue event in [place].   He tried to jump down a small ledge and fell backwards onto some brick pavers.  He did hit his head on a cart that was nearby No LOC He did not "see stars"  The right occiput is still a bit tender and sore No vomiting or severe headaches He is on asa but no other blood thinners   BP Readings from Last 3 Encounters:  09/04/18 118/82  08/27/18 122/66  07/09/18 106/61   Asa 81 prozac 20 Hydrocodone prn Lisinopril 20 Metformin 1000 BID protonix pravachol 80  Never a smoker Patient Active Problem List   Diagnosis Date Noted  . Obesity, unspecified 04/29/2014  . Diabetes mellitus 01/30/2012  . Hypertension 01/30/2012  . Dyslipidemia 01/30/2012    Past Medical History:  Diagnosis Date  . Diabetes mellitus without complication (HCC)   . Hypertension   . Kidney stone     History reviewed. No pertinent surgical history.  Social History   Tobacco Use  . Smoking status: Never Smoker  . Smokeless tobacco: Never Used  Substance Use Topics  . Alcohol use: No  . Drug use: No    Family History  Problem Relation Age of Onset  . Atrial fibrillation Mother     Allergies  Allergen Reactions  . Adhesive [Tape] Other (See Comments)    "acts like poison ivy" blisters up, itching  . Betadine [Povidone Iodine] Other (See Comments)    Blisters, itching    Medication list has been reviewed and updated.  Current Outpatient Medications on File Prior to Visit  Medication Sig Dispense Refill  . acetaminophen (TYLENOL) 650 MG CR tablet Take 1,300 mg by mouth every 8 (eight) hours as needed for pain.    Marland Kitchen aspirin 81 MG tablet Take 81 mg by mouth daily.    . cyclobenzaprine (FLEXERIL) 5 MG tablet  Take 1 tablet (5 mg total) by mouth daily as needed for muscle spasms. 30 tablet 0  . FLUoxetine (PROZAC) 20 MG tablet Take 2 tablets (40 mg total) by mouth daily. (Patient taking differently: Take 20 mg by mouth daily. ) 60 tablet 3  . HYDROcodone-acetaminophen (NORCO/VICODIN) 5-325 MG tablet Take 1 tablet by mouth every 4 (four) hours as needed for moderate pain. 15 tablet 0  . HYDROcodone-acetaminophen (NORCO/VICODIN) 5-325 MG tablet Take 1 tablet by mouth every 6 (six) hours as needed for up to 12 doses for moderate pain. 10 tablet 0  . lisinopril (PRINIVIL,ZESTRIL) 20 MG tablet Take 1 tablet (20 mg total) by mouth daily. 90 tablet 3  . metFORMIN (GLUCOPHAGE) 500 MG tablet Take 2 pills twice daily (Patient taking differently: Take 1,000 mg by mouth 2 (two) times daily with a meal. ) 360 tablet 3  .  Multiple Vitamin (MULTIVITAMIN) tablet Take 1 tablet by mouth daily.    . naproxen sodium (ANAPROX) 220 MG tablet Take 2 tablets (440 mg total) by mouth 2 (two) times daily as needed (pain). 60 tablet 2  . ondansetron (ZOFRAN ODT) 8 MG disintegrating tablet Take 1 tablet (8 mg total) by mouth every 8 (eight) hours as needed for nausea or vomiting. 20 tablet 0  . pantoprazole (PROTONIX) 40 MG tablet TAKE 1 TABLET(40 MG) BY MOUTH DAILY 30 tablet 5  . pravastatin (PRAVACHOL) 80 MG tablet Take 1 by mouth daily (Patient taking differently: Take 80 mg by mouth daily. ) 90 tablet 3  . sulfamethoxazole-trimethoprim (BACTRIM DS,SEPTRA DS) 800-160 MG tablet TK 1 T PO BID  0   No current facility-administered medications on file prior to visit.     Review of Systems:  As per HPI- otherwise negative. No fever or chills No GI sx except as associated with his gallbladder    Physical Examination: Vitals:   09/04/18 1025  BP: 118/82  Pulse: 89  Resp: 16  Temp: 98.8 F (37.1 C)  SpO2: 97%   Vitals:   09/04/18 1025  Weight: 229 lb (103.9 kg)  Height: 5\' 10"  (1.778 m)   Body mass index is 32.86 kg/m. Ideal Body Weight: Weight in (lb) to have BMI = 25: 173.9  GEN: WDWN, NAD, Non-toxic, A & O x 3, mild overweight, looks well  HEENT: Atraumatic, Normocephalic. Neck supple. No masses, No LAD. Right ear w cerumen, left TM wnl, oropharynx normal.  PEERL,EOMI.   Ears and Nose: No external deformity. CV: RRR, No M/G/R. No JVD. No thrill. No extra heart sounds. PULM: CTA B, no wheezes, crackles, rhonchi. No retractions. No resp. distress. No accessory muscle use. ABD: S, NT, ND, +BS. No rebound. No HSM.  Benign belly to exam today EXTR: No c/c/e NEURO Normal gait. Normal strength of all limbs, UE with normal sensation and DTR Mild tenderness over the right side of the occiput no hematoma, bruise or swelling. No crepitus or step-off  PSYCH: Normally interactive. Conversant. Not depressed or anxious  appearing.  Calm demeanor.    Assessment and Plan: Essential hypertension  Mixed hyperlipidemia  Controlled type 2 diabetes mellitus without complication, without long-term current use of insulin (HCC) - Plan: Hemoglobin A1c  Right-sided chest pain - Plan: DG Chest 2 View, Troponin I  Contusion of other part of head, initial encounter  Following up today BP well controlled Statin for his lipids Await A1c- will adjust regimen if needed Right sided chest pain, we think related to his gallstones Recnet EKG normal Obtain  troponin and CXR today --------------------------- Called him to discuss labs and x-ray  A1c is better, will add glipizide, continue metformin   Results for orders placed or performed in visit on 09/04/18  Hemoglobin A1c  Result Value Ref Range   Hgb A1c MFr Bld 8.1 (H) 4.6 - 6.5 %  Troponin I  Result Value Ref Range   TNIDX 0.00 0.00 - 0.06 ug/l    CHEST - 2 VIEW COMPARISON: Two-view chest x-ray 09/05/18 FINDINGS: Heart size is normal. The 5 mm right upper lobe pulmonary nodule is new since the prior exam. The left lung is clear. The visualized soft tissues and bony thorax are unremarkable. IMPRESSION: 1. No acute cardiopulmonary disease. 2. New 5 mm right upper lobe pulmonary nodule. Recommend CT chest with contrast for further evaluation.  However I don't think reading radiology had seen CT chest from 07/09/18. Called and discussed with rads available in reading room.  As nodule was NOT present then we prefer to do a repeat CXR in 8 weeks as opposed to doing another full CT so soon. Pt understands and agrees with plans  Signed Abbe Amsterdam, MD

## 2018-09-04 ENCOUNTER — Ambulatory Visit (HOSPITAL_BASED_OUTPATIENT_CLINIC_OR_DEPARTMENT_OTHER)
Admission: RE | Admit: 2018-09-04 | Discharge: 2018-09-04 | Disposition: A | Discharge status: Home / Self Care | Payer: BLUE CROSS/BLUE SHIELD | Source: Ambulatory Visit | Attending: Family Medicine | Admitting: Family Medicine

## 2018-09-04 ENCOUNTER — Encounter: Payer: Self-pay | Admitting: Family Medicine

## 2018-09-04 ENCOUNTER — Telehealth: Payer: Self-pay | Admitting: *Deleted

## 2018-09-04 ENCOUNTER — Ambulatory Visit (INDEPENDENT_AMBULATORY_CARE_PROVIDER_SITE_OTHER): Payer: BLUE CROSS/BLUE SHIELD | Admitting: Family Medicine

## 2018-09-04 VITALS — BP 118/82 | HR 89 | Temp 98.8°F | Resp 16 | Ht 70.0 in | Wt 229.0 lb

## 2018-09-04 DIAGNOSIS — R079 Chest pain, unspecified: Secondary | ICD-10-CM | POA: Diagnosis not present

## 2018-09-04 DIAGNOSIS — E119 Type 2 diabetes mellitus without complications: Secondary | ICD-10-CM

## 2018-09-04 DIAGNOSIS — S0083XA Contusion of other part of head, initial encounter: Secondary | ICD-10-CM

## 2018-09-04 DIAGNOSIS — R911 Solitary pulmonary nodule: Secondary | ICD-10-CM | POA: Diagnosis not present

## 2018-09-04 DIAGNOSIS — I10 Essential (primary) hypertension: Secondary | ICD-10-CM

## 2018-09-04 DIAGNOSIS — E782 Mixed hyperlipidemia: Secondary | ICD-10-CM | POA: Diagnosis not present

## 2018-09-04 LAB — TROPONIN I: TNIDX: 0 ug/l (ref 0.00–0.06)

## 2018-09-04 LAB — HEMOGLOBIN A1C: Hgb A1c MFr Bld: 8.1 % — ABNORMAL HIGH (ref 4.6–6.5)

## 2018-09-04 MED ORDER — GLIPIZIDE 5 MG PO TABS: 2.5000 mg | ORAL_TABLET | Freq: Every day | ORAL | 6 refills | Status: DC

## 2018-09-04 NOTE — Telephone Encounter (Signed)
Received call from [place] at [facility] to let us know that chest xray from today shows a new lung nodule.  Please see report in Epic and advise?

## 2018-09-04 NOTE — Telephone Encounter (Signed)
Got it.

## 2018-09-04 NOTE — Patient Instructions (Addendum)
We will get labs for you today and will also get a chest x-ray for your today We will adjust your diabetes medications if need be  If you start to have any persistent or severe headaches or any vomiting please seek help right away to have a CT of your head

## 2018-09-15 NOTE — Progress Notes (Signed)
ekg 08-28-18 epic   hgba1c 09-04-18 epic   Cmp, cbcdiff, urinalysis, 08-27-18 epic   lov Dr Shanda Bumps Copland 09-04-18 epic

## 2018-09-15 NOTE — Patient Instructions (Addendum)
Chad Cox  09/15/2018   Your procedure is scheduled on: 09-20-18   Report to [facility] Main  Entrance    Report to admitting at 9:00AM    Call this number if you have problems the morning of surgery [phone]     Remember: Do not eat food or drink liquids :After Midnight. BRUSH YOUR TEETH MORNING OF SURGERY AND RINSE YOUR MOUTH OUT, NO CHEWING GUM CANDY OR MINTS.     Take these medicines the morning of surgery with A SIP OF WATER: fluoxetine, pravastatin, pantoprazole, tylenol if needed  DO NOT TAKE ANY DIABETIC MEDICATIONS DAY OF YOUR SURGERY!!!                               You may not have any metal on your body including hair pins and              piercings  Do not wear jewelry, make-up, lotions, powders or perfumes, deodorant                        Men may shave face and neck.   Do not bring valuables to the hospital. [facility] IS NOT             RESPONSIBLE   FOR VALUABLES.  Contacts, dentures or bridgework may not be worn into surgery.      Patients discharged the day of surgery will not be allowed to drive home.  Name and phone number of your driver:  Special Instructions: N/A              Please read over the following fact sheets you were given: _____________________________________________________________________             [facility] - Preparing for Surgery Before surgery, you can play an important role.  Because skin is not sterile, your skin needs to be as free of germs as possible.  You can reduce the number of germs on your skin by washing with CHG (chlorahexidine gluconate) soap before surgery.  CHG is an antiseptic cleaner which kills germs and bonds with the skin to continue killing germs even after washing. Please DO NOT use if you have an allergy to CHG or antibacterial soaps.  If your skin becomes reddened/irritated stop using the CHG and inform your nurse when you arrive at Short Stay. Do not shave (including  legs and underarms) for at least 48 hours prior to the first CHG shower.  You may shave your face/neck. Please follow these instructions carefully:  1.  Shower with CHG Soap the night before surgery and the  morning of Surgery.  2.  If you choose to wash your hair, wash your hair first as usual with your  normal  shampoo.  3.  After you shampoo, rinse your hair and body thoroughly to remove the  shampoo.                           4.  Use CHG as you would any other liquid soap.  You can apply chg directly  to the skin and wash                       Gently with a scrungie or clean washcloth.  5.  Apply the CHG Soap to your body ONLY FROM THE NECK DOWN.   Do not use on face/ open                           Wound or open sores. Avoid contact with eyes, ears mouth and genitals (private parts).                       Wash face,  Genitals (private parts) with your normal soap.             6.  Wash thoroughly, paying special attention to the area where your surgery  will be performed.  7.  Thoroughly rinse your body with warm water from the neck down.  8.  DO NOT shower/wash with your normal soap after using and rinsing off  the CHG Soap.                9.  Pat yourself dry with a clean towel.            10.  Wear clean pajamas.            11.  Place clean sheets on your bed the night of your first shower and do not  sleep with pets. Day of Surgery : Do not apply any lotions/deodorants the morning of surgery.  Please wear clean clothes to the hospital/surgery center.  FAILURE TO FOLLOW THESE INSTRUCTIONS MAY RESULT IN THE CANCELLATION OF YOUR SURGERY PATIENT SIGNATURE_________________________________  NURSE SIGNATURE__________________________________  ________________________________________________________________________

## 2018-09-17 NOTE — H&P (Signed)
Chad Cox Location: [facility] Patient #: [id number] DOB: 02-09-1951 Single / Language: [place] / Race: White Male        History of Present Illness       . This is a pleasant, cooperative, 67 year old man, referred by Dr. Lockie Mola in the EDP for symptomatic gallstones. Chad Cox is his PCP.      He says that he's been having right upper quadrant pain and nausea for a few years but followed it was due to reflux. He had an attack of right upper quadrant pain nausea and vomiting on Labor Day and went to the ER and was told he had gallstones. He was feeling better and chose not to do anything about it. He was in the ER on October 20, 3 days ago with lateral quadrant pain nausea and vomiting which resolved. There is no aggravating or alleviating factors but sometimes it occurs after eating pizza. ER workup included CT scan which showed small umbilical hernia, gallstones, tiny kidney stone. Ultrasound was otherwise unremarkable. Glucose 329. WBC and LFTs normal. He is now ready to have something done about this. He can still feel something in his right upper quadrant and he's lost his appetite. No fever or chills. No acute distress today      Past history. Anxiety on Prozac. Hypertension. Non-insulin-dependent diabetes mellitus. BMI 31. History kidney stones. No prior abdominal surgery Family history mother died of sepsis 2 days after hip replacement. Father died of myocardial infarction Social history single. Not married. No children. Lives alone. Works for [employer] in [place]. Lives in [place]. Denies alcohol or tobacco. Says he has friends who can stay with him postop  I had a long discussion with him about gallbladder surgery. I think he has fairly classic symptoms and on exam it is still tender in the right upper quadrant somewhat. We will schedule him for surgery urgently. He agrees Low-fat diet Bactrim DS 1  twice a day 7 days I told him to make an appointment with his PCP immediately for better blood sugar control. He says he will do that He'll be scheduled for laparoscopic cholecystectomy with cholangiogram, possible open cholecystectomy urgently in the near future I discussed the indications, details, techniques, numerous risk of the surgery with him. He is aware the risk of bleeding, infection, conversion to open laparotomy, injury to adjacent organs with major reconstructive surgery, pancreatitis, bile leak, port site hernia, further interventions if common duct stones were found. I told him that the surgery might be more difficult than average because of acute inflammation he understands all these issues. All of his questions are answered. He agrees with this plan.   Past Surgical History No pertinent past surgical history   Diagnostic Studies History  Colonoscopy  1-5 years ago  Allergies  No Known Drug Allergies  Allergies Reconciled   Medication History  HYDROcodone-Acetaminophen (5-325MG  Tablet, Oral) Active. Zofran (4MG  Tablet, Oral) Active. Acetaminophen (650MG  Tablet, Oral) Active. Aspirin (81MG  Tablet DR, Oral) Active. Cyclobenzaprine HCl (5MG  Tablet, Oral) Active. FLUoxetine HCl (20MG  Tablet, Oral) Active. Lisinopril (20MG  Tablet, Oral) Active. metFORMIN HCl (500MG  Tablet, Oral) Active. Multi Vitamin (Oral) Active. Naproxen Sodium (220MG  Capsule, Oral) Active. Pantoprazole Sodium (40MG  Tablet DR, Oral) Active. Pravastatin Sodium (80MG  Tablet, Oral) Active. Medications Reconciled  Family History  Arthritis  Mother. Breast Cancer  Mother. Heart Disease  Father. Heart disease in male family member before age 42   Other Problems  Back Pain  Cholelithiasis  Depression  Diabetes Mellitus  Gastroesophageal Reflux Disease  High blood pressure  Hypercholesterolemia  Kidney Stone     Review of Systems  General Present- Appetite Loss  and Fatigue. Not Present- Chills, Fever, Night Sweats, Weight Gain and Weight Loss. Skin Not Present- Change in Wart/Mole, Dryness, Hives, Jaundice, New Lesions, Non-Healing Wounds, Rash and Ulcer. HEENT Not Present- Earache, Hearing Loss, Hoarseness, Nose Bleed, Oral Ulcers, Ringing in the Ears, Seasonal Allergies, Sinus Pain, Sore Throat, Visual Disturbances, Wears glasses/contact lenses and Yellow Eyes. Respiratory Not Present- Bloody sputum, Chronic Cough, Difficulty Breathing, Snoring and Wheezing. Breast Not Present- Breast Mass, Breast Pain, Nipple Discharge and Skin Changes. Cardiovascular Not Present- Chest Pain, Difficulty Breathing Lying Down, Leg Cramps, Palpitations, Rapid Heart Rate, Shortness of Breath and Swelling of Extremities. Gastrointestinal Present- Abdominal Pain, Indigestion and Vomiting. Not Present- Bloating, Bloody Stool, Change in Bowel Habits, Chronic diarrhea, Constipation, Difficulty Swallowing, Excessive gas, Gets full quickly at meals, Hemorrhoids, Nausea and Rectal Pain. Male Genitourinary Present- Frequency. Not Present- Blood in Urine, Change in Urinary Stream, Impotence, Nocturia, Painful Urination, Urgency and Urine Leakage. Musculoskeletal Present- Back Pain and Joint Pain. Not Present- Joint Stiffness, Muscle Pain, Muscle Weakness and Swelling of Extremities. Neurological Not Present- Decreased Memory, Fainting, Headaches, Numbness, Seizures, Tingling, Tremor, Trouble walking and Weakness. Psychiatric Not Present- Anxiety, Bipolar, Change in Sleep Pattern, Depression, Fearful and Frequent crying. Endocrine Not Present- Cold Intolerance, Excessive Hunger, Hair Changes, Heat Intolerance, Hot flashes and New Diabetes. Hematology Present- Easy Bruising. Not Present- Blood Thinners, Excessive bleeding, Gland problems, HIV and Persistent Infections.  Vitals  Weight: 230.4 lb Height: 72in Body Surface Area: 2.26 m Body Mass Index: 31.25 kg/m  Pain Level:  4/10 Temp.: 97.17F(Temporal)  Pulse: 120 (Regular)  P.OX: 93% (Room air) BP: 142/76 (Sitting, Left Arm, Standard)       Physical Exam  General Mental Status-Alert. General Appearance-Consistent with stated age. Hydration-Well hydrated. Voice-Normal. Note: Pleasant. Does not appear to be in any distress. BMI 31   Head and Neck Head-normocephalic, atraumatic with no lesions or palpable masses. Trachea-midline. Thyroid Gland Characteristics - normal size and consistency.  Eye Eyeball - Bilateral-Extraocular movements intact. Sclera/Conjunctiva - Bilateral-No scleral icterus.  Chest and Lung Exam Chest and lung exam reveals -quiet, even and easy respiratory effort with no use of accessory muscles and on auscultation, normal breath sounds, no adventitious sounds and normal vocal resonance. Inspection Chest Wall - Normal. Back - normal.  Breast Breast - Left-Symmetric, Non Tender, No Biopsy scars, no Dimpling, No Inflammation, No Lumpectomy scars, No Mastectomy scars, No Peau d' Orange. Breast - Right-Symmetric, Non Tender, No Biopsy scars, no Dimpling, No Inflammation, No Lumpectomy scars, No Mastectomy scars, No Peau d' Orange. Breast Lump-No Palpable Breast Mass.  Cardiovascular Cardiovascular examination reveals -normal heart sounds, regular rate and rhythm with no murmurs and normal pedal pulses bilaterally.  Abdomen Note: Soft and nondistended. Subjectively tender right upper quadrant. No mass guarding or peritonitis. No obvious hernia. Liver and spleen not enlarged.   Neurologic Neurologic evaluation reveals -alert and oriented x 3 with no impairment of recent or remote memory. Mental Status-Normal.  Musculoskeletal Normal Exam - Left-Upper Extremity Strength Normal and Lower Extremity Strength Normal. Normal Exam - Right-Upper Extremity Strength Normal and Lower Extremity Strength Normal.  Lymphatic Head &  Neck  General Head & Neck Lymphatics: Bilateral - Description - Normal. Axillary  General Axillary Region: Bilateral - Description - Normal. Tenderness - Non Tender. Femoral & Inguinal  Generalized Femoral & Inguinal Lymphatics: Bilateral - Description - Normal. Tenderness -  Non Tender.    Assessment & Plan CHOLELITHIASIS WITH ACUTE CHOLECYSTITIS WITHOUT OBSTRUCTION (K80.00) Current Plans Pt Education - Pamphlet Given - Laparoscopic Gallbladder Surgery: discussed with patient and provided information. You are being scheduled for surgery- Our schedulers will call you. You should hear from our office's scheduling department within 5 working days about the location, date, and time of surgery. We try to make accommodations for patient's preferences in scheduling surgery, but sometimes the OR schedule or the surgeon's schedule prevents [place] from making those accommodations. If you have not heard from our office [phone]) in 5 working days, call the office and ask for your surgeon's nurse. If you have other questions about your diagnosis, plan, or surgery, call the office and ask for your surgeon's nurse.   It sounds like you have been having gallbladder attacks for a few years. Obviously this has been getting worse since Labor Day Your recent evaluation in the emergency department on October 20 reveals gallstones but no obvious complication Your blood sugar was 329 and you need better control of your blood sugar  Please call your primary care physician and make an appointment immediately for better blood sugar control.  Your gallbladder attacks will continue, progress, and ultimately result in a complication I recommend that we proceed with gallbladder surgery in the near future and you agree I recommend extremely low-fat diet  Because you're still tender I'm going to call in a prescription for some antibiotics Otherwise we'll schedule you for laparoscopic cholecystectomy, possible  cholangiogram in the near future I discussed the indications, techniques, and risk of the surgery review in detail  If everything goes well you might be able to go home the same day, so long as you have an adult that can stay with you in the house for 24 hours  Started Bactrim DS 800-160 MG Oral Tablet, 1 (one) Tablet two times daily, #14, 7 days starting 08/30/2018, No Refill.  TYPE 2 DIABETES MELLITUS TREATED WITHOUT INSULIN (E11.9) HYPERTENSION, BENIGN (I10) BMI 31.0-31.9,ADULT (Z68.31) KIDNEY STONES (N20.0) CHRONIC GERD (K21.9)    Cartha Rotert M. Derrell Lolling, M.D., [facility] General and Minimally invasive Surgery Breast and Colorectal Surgery Office:   [phone] Pager:   [phone]

## 2018-09-18 ENCOUNTER — Encounter (HOSPITAL_COMMUNITY)
Admission: RE | Admit: 2018-09-18 | Discharge: 2018-09-18 | Disposition: A | Discharge status: Home / Self Care | Payer: BLUE CROSS/BLUE SHIELD | Source: Ambulatory Visit | Attending: General Surgery | Admitting: General Surgery

## 2018-09-18 ENCOUNTER — Other Ambulatory Visit: Payer: Self-pay

## 2018-09-18 ENCOUNTER — Encounter (HOSPITAL_COMMUNITY): Payer: Self-pay

## 2018-09-18 DIAGNOSIS — Z01818 Encounter for other preprocedural examination: Secondary | ICD-10-CM | POA: Diagnosis not present

## 2018-09-18 DIAGNOSIS — K8000 Calculus of gallbladder with acute cholecystitis without obstruction: Secondary | ICD-10-CM | POA: Insufficient documentation

## 2018-09-18 HISTORY — DX: Cholecystitis, unspecified: K81.9

## 2018-09-18 LAB — COMPREHENSIVE METABOLIC PANEL
ALT: 31 U/L (ref 0–44)
AST: 22 U/L (ref 15–41)
Albumin: 4.2 g/dL (ref 3.5–5.0)
Alkaline Phosphatase: 62 U/L (ref 38–126)
Anion gap: 9 (ref 5–15)
BUN: 14 mg/dL (ref 8–23)
CO2: 22 mmol/L (ref 22–32)
Calcium: 9.4 mg/dL (ref 8.9–10.3)
Chloride: 105 mmol/L (ref 98–111)
Creatinine, Ser: 1.04 mg/dL (ref 0.61–1.24)
GFR calc Af Amer: >60 mL/min (ref >60)
GFR calc non Af Amer: >60 mL/min (ref >60)
Glucose, Bld: 155 mg/dL — ABNORMAL HIGH (ref 70–99)
Potassium: 4.1 mmol/L (ref 3.5–5.1)
Sodium: 136 mmol/L (ref 135–145)
Total Bilirubin: 0.5 mg/dL (ref 0.3–1.2)
Total Protein: 7.5 g/dL (ref 6.5–8.1)

## 2018-09-18 LAB — CBC WITH DIFFERENTIAL/PLATELET
Abs Immature Granulocytes: 0.03 10*3/uL (ref 0.00–0.07)
Basophils Absolute: 0.1 10*3/uL (ref 0.0–0.1)
Basophils Relative: 1 %
Eosinophils Absolute: 0.4 10*3/uL (ref 0.0–0.5)
Eosinophils Relative: 5 %
HCT: 37.3 % — ABNORMAL LOW (ref 39.0–52.0)
Hemoglobin: 12.7 g/dL — ABNORMAL LOW (ref 13.0–17.0)
Immature Granulocytes: 0 %
Lymphocytes Relative: 13 %
Lymphs Abs: 1.1 10*3/uL (ref 0.7–4.0)
MCH: 33 pg (ref 26.0–34.0)
MCHC: 34 g/dL (ref 30.0–36.0)
MCV: 96.9 fL (ref 80.0–100.0)
Monocytes Absolute: 0.6 10*3/uL (ref 0.1–1.0)
Monocytes Relative: 8 %
Neutro Abs: 6.1 10*3/uL (ref 1.7–7.7)
Neutrophils Relative %: 73 %
Platelets: 308 10*3/uL (ref 150–400)
RBC: 3.85 MIL/uL — ABNORMAL LOW (ref 4.22–5.81)
RDW: 12.4 % (ref 11.5–15.5)
WBC: 8.3 10*3/uL (ref 4.0–10.5)
nRBC: 0 % (ref 0.0–0.2)

## 2018-09-18 LAB — GLUCOSE, CAPILLARY: Glucose-Capillary: 171 mg/dL — ABNORMAL HIGH (ref 70–99)

## 2018-09-18 NOTE — Progress Notes (Signed)
[email or web address] routed via epic to Dr Claud Kelp

## 2018-09-19 NOTE — Anesthesia Preprocedure Evaluation (Addendum)
Anesthesia Evaluation  Patient identified by MRN, date of birth, ID band Patient awake    Reviewed: Allergy & Precautions, H&P , NPO status , Patient's Chart, lab work & pertinent test results  Airway Mallampati: II  TM Distance: >3 FB Neck ROM: Full    Dental no notable dental hx. (+) Teeth Intact, Dental Advisory Given   Pulmonary neg pulmonary ROS,    Pulmonary exam normal breath sounds clear to auscultation       Cardiovascular Exercise Tolerance: Good hypertension, Pt. on medications Normal cardiovascular exam Rhythm:Regular Rate:Normal  EKG SR w PAC   Neuro/Psych Anxiety negative neurological ROS     GI/Hepatic Neg liver ROS, GERD  Medicated and Controlled,  Endo/Other  diabetes, Well Controlled, Type 2  Renal/GU negative Renal ROS     Musculoskeletal negative musculoskeletal ROS (+)   Abdominal (+) + obese,   Peds  Hematology negative hematology ROS (+)   Anesthesia Other Findings   Reproductive/Obstetrics                            Lab Results  Component Value Date   CREATININE 1.04 09/18/2018   BUN 14 09/18/2018   NA 136 09/18/2018   K 4.1 09/18/2018   CL 105 09/18/2018   CO2 22 09/18/2018    Lab Results  Component Value Date   WBC 8.3 09/18/2018   HGB 12.7 (L) 09/18/2018   HCT 37.3 (L) 09/18/2018   MCV 96.9 09/18/2018   PLT 308 09/18/2018    Anesthesia Physical Anesthesia Plan  ASA: III  Anesthesia Plan: General   Post-op Pain Management:    Induction: Intravenous  PONV Risk Score and Plan: 2 and Treatment may vary due to age or medical condition and Ondansetron  Airway Management Planned: Oral ETT  Additional Equipment:   Intra-op Plan:   Post-operative Plan: Extubation in OR  Informed Consent: I have reviewed the patients History and Physical, chart, labs and discussed the procedure including the risks, benefits and alternatives for the proposed  anesthesia with the patient or authorized representative who has indicated his/her understanding and acceptance.   Dental advisory given  Plan Discussed with: CRNA  Anesthesia Plan Comments:         Anesthesia Quick Evaluation

## 2018-09-20 ENCOUNTER — Encounter (HOSPITAL_COMMUNITY): Payer: Self-pay | Admitting: *Deleted

## 2018-09-20 ENCOUNTER — Ambulatory Visit (HOSPITAL_COMMUNITY): Payer: BLUE CROSS/BLUE SHIELD | Admitting: Anesthesiology

## 2018-09-20 ENCOUNTER — Other Ambulatory Visit: Payer: Self-pay

## 2018-09-20 ENCOUNTER — Encounter (HOSPITAL_COMMUNITY)
Admission: AD | Disposition: A | Discharge status: Home / Self Care | Payer: Self-pay | Source: Ambulatory Visit | Attending: General Surgery

## 2018-09-20 ENCOUNTER — Ambulatory Visit (HOSPITAL_COMMUNITY): Payer: BLUE CROSS/BLUE SHIELD

## 2018-09-20 ENCOUNTER — Inpatient Hospital Stay (HOSPITAL_COMMUNITY)
Admission: AD | Admit: 2018-09-20 | Discharge: 2018-09-26 | DRG: 353 | Disposition: A | Discharge status: Home / Self Care | Payer: BLUE CROSS/BLUE SHIELD | Source: Ambulatory Visit | Attending: General Surgery | Admitting: General Surgery

## 2018-09-20 DIAGNOSIS — K839 Disease of biliary tract, unspecified: Secondary | ICD-10-CM

## 2018-09-20 DIAGNOSIS — K5710 Diverticulosis of small intestine without perforation or abscess without bleeding: Secondary | ICD-10-CM | POA: Diagnosis present

## 2018-09-20 DIAGNOSIS — F419 Anxiety disorder, unspecified: Secondary | ICD-10-CM | POA: Diagnosis not present

## 2018-09-20 DIAGNOSIS — K82A1 Gangrene of gallbladder in cholecystitis: Secondary | ICD-10-CM | POA: Diagnosis present

## 2018-09-20 DIAGNOSIS — K8020 Calculus of gallbladder without cholecystitis without obstruction: Secondary | ICD-10-CM | POA: Diagnosis not present

## 2018-09-20 DIAGNOSIS — K668 Other specified disorders of peritoneum: Secondary | ICD-10-CM

## 2018-09-20 DIAGNOSIS — K567 Ileus, unspecified: Secondary | ICD-10-CM | POA: Diagnosis not present

## 2018-09-20 DIAGNOSIS — I10 Essential (primary) hypertension: Secondary | ICD-10-CM | POA: Diagnosis present

## 2018-09-20 DIAGNOSIS — J690 Pneumonitis due to inhalation of food and vomit: Secondary | ICD-10-CM | POA: Diagnosis not present

## 2018-09-20 DIAGNOSIS — K838 Other specified diseases of biliary tract: Secondary | ICD-10-CM | POA: Diagnosis not present

## 2018-09-20 DIAGNOSIS — K659 Peritonitis, unspecified: Secondary | ICD-10-CM | POA: Diagnosis not present

## 2018-09-20 DIAGNOSIS — K660 Peritoneal adhesions (postprocedural) (postinfection): Secondary | ICD-10-CM | POA: Diagnosis not present

## 2018-09-20 DIAGNOSIS — K8010 Calculus of gallbladder with chronic cholecystitis without obstruction: Secondary | ICD-10-CM | POA: Diagnosis present

## 2018-09-20 DIAGNOSIS — E119 Type 2 diabetes mellitus without complications: Secondary | ICD-10-CM | POA: Diagnosis not present

## 2018-09-20 DIAGNOSIS — K9189 Other postprocedural complications and disorders of digestive system: Principal | ICD-10-CM | POA: Diagnosis not present

## 2018-09-20 DIAGNOSIS — Z883 Allergy status to other anti-infective agents status: Secondary | ICD-10-CM

## 2018-09-20 DIAGNOSIS — K810 Acute cholecystitis: Secondary | ICD-10-CM | POA: Diagnosis not present

## 2018-09-20 DIAGNOSIS — Y838 Other surgical procedures as the cause of abnormal reaction of the patient, or of later complication, without mention of misadventure at the time of the procedure: Secondary | ICD-10-CM | POA: Diagnosis present

## 2018-09-20 DIAGNOSIS — Z79899 Other long term (current) drug therapy: Secondary | ICD-10-CM

## 2018-09-20 DIAGNOSIS — K8000 Calculus of gallbladder with acute cholecystitis without obstruction: Secondary | ICD-10-CM | POA: Diagnosis not present

## 2018-09-20 DIAGNOSIS — K5730 Diverticulosis of large intestine without perforation or abscess without bleeding: Secondary | ICD-10-CM | POA: Diagnosis not present

## 2018-09-20 DIAGNOSIS — Z7982 Long term (current) use of aspirin: Secondary | ICD-10-CM | POA: Diagnosis not present

## 2018-09-20 DIAGNOSIS — A419 Sepsis, unspecified organism: Secondary | ICD-10-CM | POA: Diagnosis not present

## 2018-09-20 DIAGNOSIS — Z7984 Long term (current) use of oral hypoglycemic drugs: Secondary | ICD-10-CM

## 2018-09-20 DIAGNOSIS — T819XXA Unspecified complication of procedure, initial encounter: Secondary | ICD-10-CM

## 2018-09-20 DIAGNOSIS — F329 Major depressive disorder, single episode, unspecified: Secondary | ICD-10-CM | POA: Diagnosis present

## 2018-09-20 DIAGNOSIS — R0602 Shortness of breath: Secondary | ICD-10-CM | POA: Diagnosis not present

## 2018-09-20 DIAGNOSIS — K420 Umbilical hernia with obstruction, without gangrene: Secondary | ICD-10-CM | POA: Diagnosis present

## 2018-09-20 DIAGNOSIS — K219 Gastro-esophageal reflux disease without esophagitis: Secondary | ICD-10-CM | POA: Diagnosis present

## 2018-09-20 DIAGNOSIS — Z91048 Other nonmedicinal substance allergy status: Secondary | ICD-10-CM | POA: Diagnosis not present

## 2018-09-20 DIAGNOSIS — J90 Pleural effusion, not elsewhere classified: Secondary | ICD-10-CM | POA: Diagnosis not present

## 2018-09-20 DIAGNOSIS — Z6831 Body mass index (BMI) 31.0-31.9, adult: Secondary | ICD-10-CM

## 2018-09-20 DIAGNOSIS — K832 Perforation of bile duct: Secondary | ICD-10-CM | POA: Diagnosis not present

## 2018-09-20 DIAGNOSIS — T8189XA Other complications of procedures, not elsewhere classified, initial encounter: Secondary | ICD-10-CM

## 2018-09-20 DIAGNOSIS — J189 Pneumonia, unspecified organism: Secondary | ICD-10-CM

## 2018-09-20 DIAGNOSIS — K915 Postcholecystectomy syndrome: Secondary | ICD-10-CM | POA: Diagnosis not present

## 2018-09-20 DIAGNOSIS — R1011 Right upper quadrant pain: Secondary | ICD-10-CM | POA: Diagnosis not present

## 2018-09-20 DIAGNOSIS — R933 Abnormal findings on diagnostic imaging of other parts of digestive tract: Secondary | ICD-10-CM | POA: Diagnosis not present

## 2018-09-20 DIAGNOSIS — R109 Unspecified abdominal pain: Secondary | ICD-10-CM | POA: Diagnosis not present

## 2018-09-20 HISTORY — DX: Other specified diseases of biliary tract: K83.8

## 2018-09-20 HISTORY — DX: Other postprocedural complications and disorders of digestive system: K91.89

## 2018-09-20 HISTORY — DX: Calculus of gallbladder with acute cholecystitis without obstruction: K80.00

## 2018-09-20 HISTORY — PX: CHOLECYSTECTOMY: SHX55

## 2018-09-20 LAB — CBC
HCT: 30.3 % — ABNORMAL LOW (ref 39.0–52.0)
HCT: 31.9 % — ABNORMAL LOW (ref 39.0–52.0)
Hemoglobin: 10.3 g/dL — ABNORMAL LOW (ref 13.0–17.0)
Hemoglobin: 10.6 g/dL — ABNORMAL LOW (ref 13.0–17.0)
MCH: 33.8 pg (ref 26.0–34.0)
MCH: 33.1 pg (ref 26.0–34.0)
MCHC: 34 g/dL (ref 30.0–36.0)
MCHC: 33.2 g/dL (ref 30.0–36.0)
MCV: 99.3 fL (ref 80.0–100.0)
MCV: 99.7 fL (ref 80.0–100.0)
Platelets: 227 10*3/uL (ref 150–400)
Platelets: 231 10*3/uL (ref 150–400)
RBC: 3.05 MIL/uL — ABNORMAL LOW (ref 4.22–5.81)
RBC: 3.2 MIL/uL — ABNORMAL LOW (ref 4.22–5.81)
RDW: 12.6 % (ref 11.5–15.5)
RDW: 12.5 % (ref 11.5–15.5)
WBC: 17.8 10*3/uL — ABNORMAL HIGH (ref 4.0–10.5)
WBC: 15.3 10*3/uL — ABNORMAL HIGH (ref 4.0–10.5)
nRBC: 0 % (ref 0.0–0.2)
nRBC: 0 % (ref 0.0–0.2)

## 2018-09-20 LAB — PROTIME-INR
INR: 1.06
Prothrombin Time: 13.7 seconds (ref 11.4–15.2)

## 2018-09-20 LAB — GLUCOSE, CAPILLARY
Glucose-Capillary: 179 mg/dL — ABNORMAL HIGH (ref 70–99)
Glucose-Capillary: 204 mg/dL — ABNORMAL HIGH (ref 70–99)
Glucose-Capillary: 274 mg/dL — ABNORMAL HIGH (ref 70–99)

## 2018-09-20 LAB — COMPREHENSIVE METABOLIC PANEL
ALT: 35 U/L (ref 0–44)
AST: 32 U/L (ref 15–41)
Albumin: 3.9 g/dL (ref 3.5–5.0)
Alkaline Phosphatase: 57 U/L (ref 38–126)
Anion gap: 10 (ref 5–15)
BUN: 16 mg/dL (ref 8–23)
CO2: 21 mmol/L — ABNORMAL LOW (ref 22–32)
Calcium: 8.4 mg/dL — ABNORMAL LOW (ref 8.9–10.3)
Chloride: 102 mmol/L (ref 98–111)
Creatinine, Ser: 1.12 mg/dL (ref 0.61–1.24)
GFR calc Af Amer: >60 mL/min (ref >60)
GFR calc non Af Amer: >60 mL/min (ref >60)
Glucose, Bld: 301 mg/dL — ABNORMAL HIGH (ref 70–99)
Potassium: 3.9 mmol/L (ref 3.5–5.1)
Sodium: 133 mmol/L — ABNORMAL LOW (ref 135–145)
Total Bilirubin: 0.7 mg/dL (ref 0.3–1.2)
Total Protein: 6.7 g/dL (ref 6.5–8.1)

## 2018-09-20 LAB — LIPASE, BLOOD: Lipase: 32 U/L (ref 11–51)

## 2018-09-20 SURGERY — LAPAROSCOPIC CHOLECYSTECTOMY WITH INTRAOPERATIVE CHOLANGIOGRAM
Anesthesia: General | Site: Abdomen

## 2018-09-20 MED ORDER — FLUOXETINE HCL 20 MG PO CAPS
40.0000 mg | ORAL_CAPSULE | Freq: Every day | ORAL | Status: DC
  Administered 2018-09-21 – 2018-09-26 (×6): 40 mg via ORAL
  Filled 2018-09-20 (×6): qty 2

## 2018-09-20 MED ORDER — HYDROMORPHONE HCL 1 MG/ML IJ SOLN
1.0000 mg | INTRAMUSCULAR | Status: DC | PRN
  Administered 2018-09-20: 1 mg via INTRAVENOUS
  Filled 2018-09-20: qty 1

## 2018-09-20 MED ORDER — MIDAZOLAM HCL 2 MG/2ML IJ SOLN
INTRAMUSCULAR | Status: AC
  Filled 2018-09-20: qty 2

## 2018-09-20 MED ORDER — MEPERIDINE HCL 50 MG/ML IJ SOLN: 6.2500 mg | INTRAMUSCULAR | Status: DC | PRN

## 2018-09-20 MED ORDER — LACTATED RINGERS IV SOLN: INTRAVENOUS | Status: DC

## 2018-09-20 MED ORDER — PRAVASTATIN SODIUM 20 MG PO TABS
80.0000 mg | ORAL_TABLET | Freq: Every day | ORAL | Status: DC
  Administered 2018-09-21 – 2018-09-26 (×6): 80 mg via ORAL
  Filled 2018-09-20 (×6): qty 4

## 2018-09-20 MED ORDER — ACETAMINOPHEN 500 MG PO TABS
1000.0000 mg | ORAL_TABLET | ORAL | Status: AC
  Administered 2018-09-20: 1000 mg via ORAL
  Filled 2018-09-20: qty 2

## 2018-09-20 MED ORDER — GUAIFENESIN-DM 100-10 MG/5ML PO SYRP: 10.0000 mL | ORAL_SOLUTION | ORAL | Status: DC | PRN

## 2018-09-20 MED ORDER — PHENYLEPHRINE 40 MCG/ML (10ML) SYRINGE FOR IV PUSH (FOR BLOOD PRESSURE SUPPORT)
PREFILLED_SYRINGE | INTRAVENOUS | Status: DC | PRN
  Administered 2018-09-20 (×5): 80 ug via INTRAVENOUS

## 2018-09-20 MED ORDER — SODIUM CHLORIDE 0.9 % IV SOLN
INTRAVENOUS | Status: DC | PRN
  Administered 2018-09-20: 250 mL via INTRAVENOUS

## 2018-09-20 MED ORDER — ALUM & MAG HYDROXIDE-SIMETH 200-200-20 MG/5ML PO SUSP: 30.0000 mL | Freq: Four times a day (QID) | ORAL | Status: DC | PRN

## 2018-09-20 MED ORDER — ACETAMINOPHEN 650 MG RE SUPP
650.0000 mg | RECTAL | Status: DC | PRN
  Filled 2018-09-20: qty 1

## 2018-09-20 MED ORDER — SODIUM CHLORIDE 0.9 % IV BOLUS: 1000.0000 mL | Freq: Three times a day (TID) | INTRAVENOUS | Status: DC | PRN

## 2018-09-20 MED ORDER — KETOROLAC TROMETHAMINE 30 MG/ML IJ SOLN
30.0000 mg | Freq: Once | INTRAMUSCULAR | Status: AC
  Administered 2018-09-20: 30 mg via INTRAVENOUS

## 2018-09-20 MED ORDER — CHLORHEXIDINE GLUCONATE CLOTH 2 % EX PADS: 6.0000 | MEDICATED_PAD | Freq: Once | CUTANEOUS | Status: DC

## 2018-09-20 MED ORDER — GABAPENTIN 300 MG PO CAPS
300.0000 mg | ORAL_CAPSULE | ORAL | Status: AC
  Administered 2018-09-20: 300 mg via ORAL
  Filled 2018-09-20: qty 1

## 2018-09-20 MED ORDER — ONDANSETRON HCL 4 MG/2ML IJ SOLN: 4.0000 mg | Freq: Four times a day (QID) | INTRAMUSCULAR | Status: DC | PRN

## 2018-09-20 MED ORDER — LISINOPRIL 20 MG PO TABS
20.0000 mg | ORAL_TABLET | Freq: Every day | ORAL | Status: DC
  Administered 2018-09-21 – 2018-09-26 (×4): 20 mg via ORAL
  Filled 2018-09-20: qty 1
  Filled 2018-09-20: qty 2
  Filled 2018-09-20 (×2): qty 1
  Filled 2018-09-20: qty 2
  Filled 2018-09-20: qty 1

## 2018-09-20 MED ORDER — ROCURONIUM BROMIDE 50 MG/5ML IV SOSY
PREFILLED_SYRINGE | INTRAVENOUS | Status: DC | PRN
  Administered 2018-09-20: 50 mg via INTRAVENOUS
  Administered 2018-09-20: 20 mg via INTRAVENOUS

## 2018-09-20 MED ORDER — HYDROCORTISONE 2.5 % RE CREA: 1.0000 "application " | TOPICAL_CREAM | Freq: Four times a day (QID) | RECTAL | Status: DC | PRN

## 2018-09-20 MED ORDER — ONDANSETRON HCL 4 MG/2ML IJ SOLN
INTRAMUSCULAR | Status: DC | PRN
  Administered 2018-09-20: 4 mg via INTRAVENOUS

## 2018-09-20 MED ORDER — MENTHOL 3 MG MT LOZG: 1.0000 | LOZENGE | OROMUCOSAL | Status: DC | PRN

## 2018-09-20 MED ORDER — BUPIVACAINE-EPINEPHRINE (PF) 0.25% -1:200000 IJ SOLN
INTRAMUSCULAR | Status: DC | PRN
  Administered 2018-09-20: 27 mL

## 2018-09-20 MED ORDER — 0.9 % SODIUM CHLORIDE (POUR BTL) OPTIME
TOPICAL | Status: DC | PRN
  Administered 2018-09-20: 1000 mL

## 2018-09-20 MED ORDER — ASPIRIN EC 81 MG PO TBEC
81.0000 mg | DELAYED_RELEASE_TABLET | Freq: Every day | ORAL | Status: DC
  Administered 2018-09-21 – 2018-09-26 (×6): 81 mg via ORAL
  Filled 2018-09-20 (×6): qty 1

## 2018-09-20 MED ORDER — FENTANYL CITRATE (PF) 100 MCG/2ML IJ SOLN
25.0000 ug | INTRAMUSCULAR | Status: DC | PRN
  Administered 2018-09-20 (×3): 50 ug via INTRAVENOUS

## 2018-09-20 MED ORDER — LACTATED RINGERS IV BOLUS
1000.0000 mL | Freq: Once | INTRAVENOUS | Status: AC
  Administered 2018-09-20: 1000 mL via INTRAVENOUS

## 2018-09-20 MED ORDER — OXYCODONE HCL 5 MG PO TABS
5.0000 mg | ORAL_TABLET | ORAL | Status: DC | PRN
  Administered 2018-09-22 – 2018-09-26 (×2): 5 mg via ORAL
  Filled 2018-09-20: qty 2
  Filled 2018-09-20 (×2): qty 1

## 2018-09-20 MED ORDER — HYDROCORTISONE 1 % EX CREA: 1.0000 "application " | TOPICAL_CREAM | Freq: Three times a day (TID) | CUTANEOUS | Status: DC | PRN

## 2018-09-20 MED ORDER — HYDROCODONE-ACETAMINOPHEN 7.5-325 MG PO TABS: 1.0000 | ORAL_TABLET | Freq: Once | ORAL | Status: DC | PRN

## 2018-09-20 MED ORDER — BUPIVACAINE-EPINEPHRINE (PF) 0.25% -1:200000 IJ SOLN
INTRAMUSCULAR | Status: AC
  Filled 2018-09-20: qty 30

## 2018-09-20 MED ORDER — PANTOPRAZOLE SODIUM 40 MG PO TBEC
40.0000 mg | DELAYED_RELEASE_TABLET | Freq: Two times a day (BID) | ORAL | Status: DC
  Administered 2018-09-21 – 2018-09-26 (×10): 40 mg via ORAL
  Filled 2018-09-20 (×10): qty 1

## 2018-09-20 MED ORDER — SUGAMMADEX SODIUM 500 MG/5ML IV SOLN
INTRAVENOUS | Status: DC | PRN
  Administered 2018-09-20: 100 mg via INTRAVENOUS
  Administered 2018-09-20: 300 mg via INTRAVENOUS

## 2018-09-20 MED ORDER — FLUOXETINE HCL 20 MG PO CAPS
20.0000 mg | ORAL_CAPSULE | Freq: Every day | ORAL | Status: DC
  Administered 2018-09-20: 20 mg via ORAL
  Filled 2018-09-20 (×2): qty 1

## 2018-09-20 MED ORDER — ACETAMINOPHEN 500 MG PO TABS: 1000.0000 mg | ORAL_TABLET | Freq: Four times a day (QID) | ORAL | Status: DC

## 2018-09-20 MED ORDER — GABAPENTIN 300 MG PO CAPS
300.0000 mg | ORAL_CAPSULE | Freq: Two times a day (BID) | ORAL | Status: DC
  Administered 2018-09-20 – 2018-09-21 (×2): 300 mg via ORAL
  Filled 2018-09-20 (×2): qty 1

## 2018-09-20 MED ORDER — KETOROLAC TROMETHAMINE 30 MG/ML IJ SOLN
INTRAMUSCULAR | Status: AC
  Filled 2018-09-20: qty 1

## 2018-09-20 MED ORDER — HYDROCODONE-ACETAMINOPHEN 5-325 MG PO TABS: 1.0000 | ORAL_TABLET | ORAL | Status: DC | PRN

## 2018-09-20 MED ORDER — SODIUM CHLORIDE 0.9% FLUSH: 3.0000 mL | Freq: Two times a day (BID) | INTRAVENOUS | Status: DC

## 2018-09-20 MED ORDER — ONDANSETRON 4 MG PO TBDP: 4.0000 mg | ORAL_TABLET | Freq: Four times a day (QID) | ORAL | Status: DC | PRN

## 2018-09-20 MED ORDER — ONDANSETRON HCL 4 MG/2ML IJ SOLN: 4.0000 mg | Freq: Once | INTRAMUSCULAR | Status: DC | PRN

## 2018-09-20 MED ORDER — LACTATED RINGERS IR SOLN
Status: DC | PRN
  Administered 2018-09-20: 1000 mL

## 2018-09-20 MED ORDER — LIP MEDEX EX OINT
1.0000 "application " | TOPICAL_OINTMENT | Freq: Two times a day (BID) | CUTANEOUS | Status: DC
  Administered 2018-09-21 – 2018-09-26 (×9): 1 via TOPICAL
  Filled 2018-09-20 (×4): qty 7

## 2018-09-20 MED ORDER — METFORMIN HCL 500 MG PO TABS
1000.0000 mg | ORAL_TABLET | Freq: Two times a day (BID) | ORAL | Status: DC
  Administered 2018-09-20 – 2018-09-26 (×11): 1000 mg via ORAL
  Filled 2018-09-20 (×10): qty 2

## 2018-09-20 MED ORDER — LACTATED RINGERS IV SOLN
INTRAVENOUS | Status: DC
  Administered 2018-09-20 (×3): via INTRAVENOUS

## 2018-09-20 MED ORDER — LIDOCAINE 2% (20 MG/ML) 5 ML SYRINGE
INTRAMUSCULAR | Status: DC | PRN
  Administered 2018-09-20: 100 mg via INTRAVENOUS

## 2018-09-20 MED ORDER — FENTANYL CITRATE (PF) 250 MCG/5ML IJ SOLN
INTRAMUSCULAR | Status: AC
  Filled 2018-09-20: qty 5

## 2018-09-20 MED ORDER — MIDAZOLAM HCL 2 MG/2ML IJ SOLN
INTRAMUSCULAR | Status: DC | PRN
  Administered 2018-09-20: 2 mg via INTRAVENOUS

## 2018-09-20 MED ORDER — METOPROLOL TARTRATE 5 MG/5ML IV SOLN: 5.0000 mg | Freq: Four times a day (QID) | INTRAVENOUS | Status: DC | PRN

## 2018-09-20 MED ORDER — DEXAMETHASONE SODIUM PHOSPHATE 10 MG/ML IJ SOLN
INTRAMUSCULAR | Status: DC | PRN
  Administered 2018-09-20: 10 mg via INTRAVENOUS

## 2018-09-20 MED ORDER — GLIPIZIDE 2.5 MG HALF TABLET
2.5000 mg | ORAL_TABLET | Freq: Every day | ORAL | Status: DC
  Administered 2018-09-21: 2.5 mg via ORAL
  Filled 2018-09-20 (×2): qty 1

## 2018-09-20 MED ORDER — PROCHLORPERAZINE EDISYLATE 10 MG/2ML IJ SOLN: 5.0000 mg | INTRAMUSCULAR | Status: DC | PRN

## 2018-09-20 MED ORDER — PROPOFOL 10 MG/ML IV BOLUS
INTRAVENOUS | Status: DC | PRN
  Administered 2018-09-20: 130 mg via INTRAVENOUS

## 2018-09-20 MED ORDER — BISACODYL 10 MG RE SUPP: 10.0000 mg | Freq: Two times a day (BID) | RECTAL | Status: DC | PRN

## 2018-09-20 MED ORDER — LORAZEPAM 2 MG/ML IJ SOLN: 0.5000 mg | Freq: Three times a day (TID) | INTRAMUSCULAR | Status: DC | PRN

## 2018-09-20 MED ORDER — HYDRALAZINE HCL 20 MG/ML IJ SOLN: 5.0000 mg | INTRAMUSCULAR | Status: DC | PRN

## 2018-09-20 MED ORDER — CELECOXIB 200 MG PO CAPS
200.0000 mg | ORAL_CAPSULE | ORAL | Status: AC
  Administered 2018-09-20: 200 mg via ORAL
  Filled 2018-09-20: qty 1

## 2018-09-20 MED ORDER — PHENYLEPHRINE 40 MCG/ML (10ML) SYRINGE FOR IV PUSH (FOR BLOOD PRESSURE SUPPORT)
PREFILLED_SYRINGE | INTRAVENOUS | Status: AC
  Filled 2018-09-20: qty 10

## 2018-09-20 MED ORDER — FENTANYL CITRATE (PF) 100 MCG/2ML IJ SOLN
INTRAMUSCULAR | Status: AC
  Filled 2018-09-20: qty 2

## 2018-09-20 MED ORDER — CEFAZOLIN SODIUM-DEXTROSE 2-4 GM/100ML-% IV SOLN
2.0000 g | INTRAVENOUS | Status: AC
  Administered 2018-09-20: 2 g via INTRAVENOUS
  Filled 2018-09-20: qty 100

## 2018-09-20 MED ORDER — CLONIDINE HCL 0.2 MG PO TABS
0.2000 mg | ORAL_TABLET | Freq: Once | ORAL | Status: AC
  Administered 2018-09-20: 0.2 mg via ORAL
  Filled 2018-09-20: qty 1

## 2018-09-20 MED ORDER — FENTANYL CITRATE (PF) 250 MCG/5ML IJ SOLN
INTRAMUSCULAR | Status: DC | PRN
  Administered 2018-09-20: 100 ug via INTRAVENOUS
  Administered 2018-09-20: 25 ug via INTRAVENOUS

## 2018-09-20 MED ORDER — LACTATED RINGERS IV SOLN
INTRAVENOUS | Status: DC
  Administered 2018-09-20 – 2018-09-21 (×3): via INTRAVENOUS

## 2018-09-20 MED ORDER — CYCLOBENZAPRINE HCL 5 MG PO TABS
5.0000 mg | ORAL_TABLET | Freq: Three times a day (TID) | ORAL | Status: DC | PRN
  Administered 2018-09-22 – 2018-09-24 (×2): 10 mg via ORAL
  Administered 2018-09-24 – 2018-09-25 (×3): 5 mg via ORAL
  Filled 2018-09-20 (×4): qty 2
  Filled 2018-09-20: qty 1

## 2018-09-20 MED ORDER — OXYCODONE HCL 5 MG PO TABS
5.0000 mg | ORAL_TABLET | ORAL | Status: DC | PRN
  Administered 2018-09-20: 10 mg via ORAL

## 2018-09-20 MED ORDER — EPHEDRINE SULFATE-NACL 50-0.9 MG/10ML-% IV SOSY
PREFILLED_SYRINGE | INTRAVENOUS | Status: DC | PRN
  Administered 2018-09-20: 10 mg via INTRAVENOUS
  Administered 2018-09-20: 5 mg via INTRAVENOUS

## 2018-09-20 MED ORDER — MAGIC MOUTHWASH
15.0000 mL | Freq: Four times a day (QID) | ORAL | Status: DC | PRN
  Filled 2018-09-20: qty 15

## 2018-09-20 MED ORDER — SIMETHICONE 40 MG/0.6ML PO SUSP
40.0000 mg | Freq: Four times a day (QID) | ORAL | Status: DC | PRN
  Filled 2018-09-20: qty 0.6

## 2018-09-20 MED ORDER — PIPERACILLIN-TAZOBACTAM 3.375 G IVPB 30 MIN
3.3750 g | Freq: Once | INTRAVENOUS | Status: AC
  Administered 2018-09-20: 3.375 g via INTRAVENOUS
  Filled 2018-09-20: qty 50

## 2018-09-20 MED ORDER — SODIUM CHLORIDE 0.9 % IV SOLN: 250.0000 mL | INTRAVENOUS | Status: DC | PRN

## 2018-09-20 MED ORDER — PANTOPRAZOLE SODIUM 40 MG IV SOLR: 40.0000 mg | Freq: Every day | INTRAVENOUS | Status: DC

## 2018-09-20 MED ORDER — PHENOL 1.4 % MT LIQD
1.0000 | OROMUCOSAL | Status: DC | PRN
  Filled 2018-09-20: qty 177

## 2018-09-20 MED ORDER — ACETAMINOPHEN 325 MG PO TABS: 650.0000 mg | ORAL_TABLET | ORAL | Status: DC | PRN

## 2018-09-20 MED ORDER — ACETAMINOPHEN 500 MG PO TABS
1000.0000 mg | ORAL_TABLET | Freq: Three times a day (TID) | ORAL | Status: DC
  Administered 2018-09-20 – 2018-09-26 (×14): 1000 mg via ORAL
  Filled 2018-09-20 (×14): qty 2

## 2018-09-20 MED ORDER — HYDROCODONE-ACETAMINOPHEN 5-325 MG PO TABS: 1.0000 | ORAL_TABLET | Freq: Four times a day (QID) | ORAL | 0 refills | Status: DC | PRN

## 2018-09-20 MED ORDER — METOCLOPRAMIDE HCL 5 MG/ML IJ SOLN: 5.0000 mg | Freq: Three times a day (TID) | INTRAMUSCULAR | Status: DC | PRN

## 2018-09-20 MED ORDER — INSULIN ASPART 100 UNIT/ML ~~LOC~~ SOLN: 0.0000 [IU] | Freq: Three times a day (TID) | SUBCUTANEOUS | Status: DC

## 2018-09-20 MED ORDER — PANTOPRAZOLE SODIUM 40 MG PO TBEC: 40.0000 mg | DELAYED_RELEASE_TABLET | Freq: Every day | ORAL | Status: DC | PRN

## 2018-09-20 MED ORDER — OXYCODONE HCL 5 MG PO TABS
ORAL_TABLET | ORAL | Status: AC
  Filled 2018-09-20: qty 2

## 2018-09-20 MED ORDER — IOPAMIDOL (ISOVUE-300) INJECTION 61%
INTRAVENOUS | Status: AC
  Filled 2018-09-20: qty 50

## 2018-09-20 MED ORDER — METHOCARBAMOL 1000 MG/10ML IJ SOLN: 1000.0000 mg | Freq: Four times a day (QID) | INTRAVENOUS | Status: DC | PRN

## 2018-09-20 MED ORDER — PIPERACILLIN-TAZOBACTAM 3.375 G IVPB
3.3750 g | Freq: Three times a day (TID) | INTRAVENOUS | Status: DC
  Administered 2018-09-21 – 2018-09-25 (×14): 3.375 g via INTRAVENOUS
  Filled 2018-09-20 (×13): qty 50

## 2018-09-20 MED ORDER — DIPHENHYDRAMINE HCL 50 MG/ML IJ SOLN: 12.5000 mg | Freq: Four times a day (QID) | INTRAMUSCULAR | Status: DC | PRN

## 2018-09-20 MED ORDER — SODIUM CHLORIDE 0.9% FLUSH: 3.0000 mL | INTRAVENOUS | Status: DC | PRN

## 2018-09-20 MED ORDER — TECHNETIUM TC 99M MEBROFENIN IV KIT
5.0000 | PACK | Freq: Once | INTRAVENOUS | Status: AC
  Administered 2018-09-20: 5 via INTRAVENOUS

## 2018-09-20 MED ORDER — CYCLOBENZAPRINE HCL 5 MG PO TABS: 5.0000 mg | ORAL_TABLET | Freq: Every day | ORAL | Status: DC | PRN

## 2018-09-20 MED ORDER — INSULIN ASPART 100 UNIT/ML ~~LOC~~ SOLN
0.0000 [IU] | Freq: Every day | SUBCUTANEOUS | Status: DC
  Administered 2018-09-20: 3 [IU] via SUBCUTANEOUS

## 2018-09-20 MED ORDER — METHOCARBAMOL 500 MG PO TABS: 1000.0000 mg | ORAL_TABLET | Freq: Four times a day (QID) | ORAL | Status: DC | PRN

## 2018-09-20 MED ORDER — HYDROMORPHONE HCL 1 MG/ML IJ SOLN
0.5000 mg | INTRAMUSCULAR | Status: DC | PRN
  Administered 2018-09-20 – 2018-09-21 (×2): 1 mg via INTRAVENOUS
  Administered 2018-09-21: 2 mg via INTRAVENOUS
  Administered 2018-09-21: 0.5 mg via INTRAVENOUS
  Filled 2018-09-20 (×3): qty 1
  Filled 2018-09-20: qty 2

## 2018-09-20 MED ORDER — FENTANYL CITRATE (PF) 100 MCG/2ML IJ SOLN: 25.0000 ug | INTRAMUSCULAR | Status: DC | PRN

## 2018-09-20 SURGICAL SUPPLY — 37 items
APPLIER CLIP ROT 10 11.4 M/L (STAPLE) ×2
CABLE HIGH FREQUENCY MONO STRZ (ELECTRODE) ×2 IMPLANT
CHLORAPREP W/TINT 26ML (MISCELLANEOUS) ×2 IMPLANT
CLIP APPLIE ROT 10 11.4 M/L (STAPLE) ×1 IMPLANT
COVER MAYO STAND STRL (DRAPES) ×2 IMPLANT
COVER SURGICAL LIGHT HANDLE (MISCELLANEOUS) ×2 IMPLANT
COVER WAND RF STERILE (DRAPES) ×2 IMPLANT
DECANTER SPIKE VIAL GLASS SM (MISCELLANEOUS) ×2 IMPLANT
DERMABOND ADVANCED (GAUZE/BANDAGES/DRESSINGS) ×1
DERMABOND ADVANCED .7 DNX12 (GAUZE/BANDAGES/DRESSINGS) ×1 IMPLANT
DRAPE C-ARM 42X120 X-RAY (DRAPES) ×2 IMPLANT
ELECT PENCIL ROCKER SW 15FT (MISCELLANEOUS) ×4 IMPLANT
ELECT REM PT RETURN 15FT ADLT (MISCELLANEOUS) ×2 IMPLANT
GLOVE EUDERMIC 7 POWDERFREE (GLOVE) ×2 IMPLANT
GOWN STRL REUS W/TWL XL LVL3 (GOWN DISPOSABLE) ×8 IMPLANT
HEMOSTAT SNOW SURGICEL 2X4 (HEMOSTASIS) IMPLANT
KIT BASIN OR (CUSTOM PROCEDURE TRAY) ×2 IMPLANT
POSITIONER SURGICAL ARM (MISCELLANEOUS) IMPLANT
POUCH RETRIEVAL ECOSAC 10 (ENDOMECHANICALS) ×1 IMPLANT
POUCH RETRIEVAL ECOSAC 10MM (ENDOMECHANICALS) ×1
POUCH SPECIMEN RETRIEVAL 10MM (ENDOMECHANICALS) IMPLANT
SCISSORS LAP 5X35 DISP (ENDOMECHANICALS) ×2 IMPLANT
SET CHOLANGIOGRAPH MIX (MISCELLANEOUS) ×2 IMPLANT
SET IRRIG TUBING LAPAROSCOPIC (IRRIGATION / IRRIGATOR) ×2 IMPLANT
SLEEVE XCEL OPT CAN 5 100 (ENDOMECHANICALS) ×2 IMPLANT
SUT MNCRL AB 4-0 PS2 18 (SUTURE) ×4 IMPLANT
SUT NOVA NAB GS-21 0 18 T12 DT (SUTURE) ×2 IMPLANT
SUT VIC AB 3-0 SH 27 (SUTURE) ×2
SUT VIC AB 3-0 SH 27XBRD (SUTURE) ×2 IMPLANT
TAPE CLOTH 4X10 WHT NS (GAUZE/BANDAGES/DRESSINGS) IMPLANT
TOWEL OR 17X26 10 PK STRL BLUE (TOWEL DISPOSABLE) ×2 IMPLANT
TOWEL OR NON WOVEN STRL DISP B (DISPOSABLE) ×2 IMPLANT
TRAY LAPAROSCOPIC (CUSTOM PROCEDURE TRAY) ×2 IMPLANT
TROCAR BLADELESS OPT 5 100 (ENDOMECHANICALS) ×2 IMPLANT
TROCAR XCEL BLUNT TIP 100MML (ENDOMECHANICALS) ×2 IMPLANT
TROCAR XCEL NON-BLD 11X100MML (ENDOMECHANICALS) ×2 IMPLANT
TUBING INSUF HEATED (TUBING) ×2 IMPLANT

## 2018-09-20 NOTE — Progress Notes (Signed)
Per Dr. Derrell LollingIngram, will get an EKG and notify Dr. Richardson LandryHouser of the patients condition.   Dr. Richardson LandryHouser spoke to patient and reviewed EKG. Per [facility] pt is okay to be transferred to the 5th floor.

## 2018-09-20 NOTE — Progress Notes (Signed)
Chad Cox  [birth year] [id number]  Patient Care Team: Copland, Gwenlyn Found, MD as PCP - General (Family Medicine)  This patient is a 67 y.o.male who calls today for surgical evaluation.   HIDA nuclear medicine scan done.  Paged by radiology department that the radiologist wished to tell her to tell me that is suspicious for bile leak.  Patient still with some shoulder pain and tired but hemodynamically stable.  Pain under better control.  No nausea or vomiting.  Called discussed with gastroenterology on call.  Dr. Christella Hartigan covering.  Asked that they evaluate the patient to consider ERCP and stenting tomorrow.  Liver function tests normal on lab work this afternoon so far.  Laboratory values ordered for the morning.  We will give extra IV fluid bolus and start on IV antibiotics for now.  Keep NPO.  Try and hold off on any reoperation at this time and see if stenting is all that is needed  Patient Active Problem List   Diagnosis Date Noted  . Gallstones and inflammation of gallbladder without obstruction 09/20/2018  . Cholelithiasis and cholecystitis without obstruction 09/20/2018  . Obesity, unspecified 04/29/2014  . Diabetes mellitus 01/30/2012  . Hypertension 01/30/2012  . Dyslipidemia 01/30/2012    Past Medical History:  Diagnosis Date  . Cholecystitis   . Diabetes mellitus without complication (HCC)   . Gallstones and inflammation of gallbladder without obstruction 09/20/2018  . Hypertension   . Kidney stone     Past Surgical History:  Procedure Laterality Date  . WISDOM TOOTH EXTRACTION      Social History   Socioeconomic History  . Marital status: Single    Spouse name: Not on file  . Number of children: Not on file  . Years of education: Not on file  . Highest education level: Not on file  Occupational History  . Not on file  Social Needs  . Financial resource strain: Not on file  . Food insecurity:    Worry: Not on file    Inability: Not on file  .  Transportation needs:    Medical: Not on file    Non-medical: Not on file  Tobacco Use  . Smoking status: Never Smoker  . Smokeless tobacco: Never Used  Substance and Sexual Activity  . Alcohol use: Yes    Comment: occ  . Drug use: No  . Sexual activity: Never  Lifestyle  . Physical activity:    Days per week: Not on file    Minutes per session: Not on file  . Stress: Not on file  Relationships  . Social connections:    Talks on phone: Not on file    Gets together: Not on file    Attends religious service: Not on file    Active member of club or organization: Not on file    Attends meetings of clubs or organizations: Not on file    Relationship status: Not on file  . Intimate partner violence:    Fear of current or ex partner: Not on file    Emotionally abused: Not on file    Physically abused: Not on file    Forced sexual activity: Not on file  Other Topics Concern  . Not on file  Social History Narrative  . Not on file    Family History  Problem Relation Age of Onset  . Atrial fibrillation Mother     Current Facility-Administered Medications  Medication Dose Route Frequency Provider Last Rate Last Dose  . acetaminophen (TYLENOL) tablet  1,000 mg  1,000 mg Oral TID Karie Soda, MD      . alum & mag hydroxide-simeth (MAALOX/MYLANTA) 200-200-20 MG/5ML suspension 30 mL  30 mL Oral Q6H PRN Karie Soda, MD      . Melene Muller ON 09/21/2018] aspirin EC tablet 81 mg  81 mg Oral Daily Karie Soda, MD      . bisacodyl (DULCOLAX) suppository 10 mg  10 mg Rectal Q12H PRN Karie Soda, MD      . cyclobenzaprine (FLEXERIL) tablet 5-10 mg  5-10 mg Oral TID PRN Karie Soda, MD      . diphenhydrAMINE (BENADRYL) injection 12.5-25 mg  12.5-25 mg Intravenous Q6H PRN Karie Soda, MD      . fentaNYL (SUBLIMAZE) 100 MCG/2ML injection           . fentaNYL (SUBLIMAZE) 100 MCG/2ML injection           . [START ON 09/21/2018] FLUoxetine (PROZAC) capsule 40 mg  40 mg Oral Daily Karie Soda, MD      . gabapentin (NEURONTIN) capsule 300 mg  300 mg Oral BID Karie Soda, MD      . Melene Muller ON 09/21/2018] glipiZIDE (GLUCOTROL) tablet 2.5 mg  2.5 mg Oral QAC breakfast Claud Kelp, MD      . guaiFENesin-dextromethorphan (ROBITUSSIN DM) 100-10 MG/5ML syrup 10 mL  10 mL Oral Q4H PRN Karie Soda, MD      . hydrALAZINE (APRESOLINE) injection 5-10 mg  5-10 mg Intravenous Q4H PRN Karie Soda, MD      . hydrocortisone (ANUSOL-HC) 2.5 % rectal cream 1 application  1 application Topical QID PRN Karie Soda, MD      . hydrocortisone cream 1 % 1 application  1 application Topical TID PRN Karie Soda, MD      . HYDROmorphone (DILAUDID) injection 0.5-2 mg  0.5-2 mg Intravenous Q1H PRN Karie Soda, MD      . Melene Muller ON 09/21/2018] insulin aspart (novoLOG) injection 0-15 Units  0-15 Units Subcutaneous TID WC Karie Soda, MD      . insulin aspart (novoLOG) injection 0-5 Units  0-5 Units Subcutaneous QHS Karie Soda, MD      . ketorolac (TORADOL) 30 MG/ML injection           . lactated ringers bolus 1,000 mL  1,000 mL Intravenous Once Karie Soda, MD      . lactated ringers infusion   Intravenous Continuous Karie Soda, MD 125 mL/hr at 09/20/18 1631    . lip balm (CARMEX) ointment 1 application  1 application Topical BID Karie Soda, MD      . lisinopril (PRINIVIL,ZESTRIL) tablet 20 mg  20 mg Oral Daily Claud Kelp, MD      . LORazepam (ATIVAN) injection 0.5-1 mg  0.5-1 mg Intravenous Q8H PRN Otho Bellows, RPH      . magic mouthwash  15 mL Oral QID PRN Karie Soda, MD      . menthol-cetylpyridinium (CEPACOL) lozenge 3 mg  1 lozenge Oral PRN Karie Soda, MD      . metFORMIN (GLUCOPHAGE) tablet 1,000 mg  1,000 mg Oral BID WC Claud Kelp, MD   1,000 mg at 09/20/18 1638  . metoCLOPramide (REGLAN) injection 5-10 mg  5-10 mg Intravenous Q8H PRN Karie Soda, MD      . metoprolol tartrate (LOPRESSOR) injection 5 mg  5 mg Intravenous Q6H PRN Karie Soda, MD      .  ondansetron (ZOFRAN-ODT) disintegrating tablet 4 mg  4 mg Oral Q6H PRN Claud Kelp,  MD       Or  . ondansetron (ZOFRAN) injection 4 mg  4 mg Intravenous Q6H PRN Claud Kelp, MD      . oxyCODONE (Oxy IR/ROXICODONE) 5 MG immediate release tablet           . oxyCODONE (Oxy IR/ROXICODONE) immediate release tablet 5-10 mg  5-10 mg Oral Q4H PRN Karie Soda, MD      . pantoprazole (PROTONIX) EC tablet 40 mg  40 mg Oral BID Edwena Felty, MD      . phenol (CHLORASEPTIC) mouth spray 1-2 spray  1-2 spray Mouth/Throat PRN Karie Soda, MD      . Melene Muller ON 09/21/2018] pravastatin (PRAVACHOL) tablet 80 mg  80 mg Oral Daily Claud Kelp, MD      . prochlorperazine (COMPAZINE) injection 5-10 mg  5-10 mg Intravenous Q4H PRN Karie Soda, MD      . simethicone (MYLICON) 40 MG/0.6ML suspension 40 mg  40 mg Oral QID PRN Chilton Si, Terri L, RPH      . sodium chloride 0.9 % bolus 1,000 mL  1,000 mL Intravenous Q8H PRN Karie Soda, MD         Allergies  Allergen Reactions  . Adhesive [Tape] Other (See Comments)    "acts like poison ivy" blisters up, itching  . Betadine [Povidone Iodine] Other (See Comments)    Blisters, itching    BP 110/65 (BP Location: Right Arm)   Pulse 78   Temp 97.6 F (36.4 C)   Resp (!) 22   Ht 5\' 10"  (1.778 m)   Wt 103.9 kg   SpO2 98%   BMI 32.86 kg/m   Dg Chest 2 View  Addendum Date: 09/04/2018   ADDENDUM REPORT: 09/04/2018 17:52 ADDENDUM: Prior CT exam of July 09, 2018 is now available for comparison. Right upper lobe nodular density identified on today's radiograph is not present on the recent CT scan. Repeat chest radiograph in 8 weeks is recommended to follow-up nodular density. These results were called by telephone at the time of interpretation on 09/04/2018 at 5:51 pm to Dr. Abbe Amsterdam , who verbally acknowledged these results. Electronically Signed   By: Lupita Raider, M.D.   On: 09/04/2018 17:52   Result Date: 09/04/2018 CLINICAL DATA:   Right-sided chest pain. EXAM: CHEST - 2 VIEW COMPARISON:  Two-view chest x-ray 09/05/16 FINDINGS: Heart size is normal. The 5 mm right upper lobe pulmonary nodule is new since the prior exam. The left lung is clear. The visualized soft tissues and bony thorax are unremarkable. IMPRESSION: 1. No acute cardiopulmonary disease. 2. New 5 mm right upper lobe pulmonary nodule. Recommend CT chest with contrast for further evaluation. These results will be called to the ordering clinician or representative by the Radiologist Assistant, and communication documented in the PACS or zVision Dashboard. Electronically Signed: By: Marin Roberts M.D. On: 09/04/2018 11:27   Nm Hepatobiliary Liver Func  Result Date: 09/20/2018 CLINICAL DATA:  Status post cholecystectomy today. Abdominal pain. Evaluate for postop bile leak. EXAM: NUCLEAR MEDICINE HEPATOBILIARY IMAGING TECHNIQUE: Sequential images of the abdomen were obtained out to 60 minutes following intravenous administration of radiopharmaceutical. RADIOPHARMACEUTICALS:  5.0 mCi Tc-32m  Choletec IV COMPARISON:  None. FINDINGS: Prompt uptake and biliary excretion of activity by the liver is seen. Gallbladder is not visualized, consistent with prior cholecystectomy. Excreted biliary activity does not enter the small bowel, but passes into area of the porta hepatis and Morison's pouch, consistent with postop bile leak. IMPRESSION: Prior cholecystectomy.  Positive for postop bile leak in the right upper quadrant. These results will be called to the ordering clinician or representative by the Radiologist Assistant, and communication documented in the PACS or zVision Dashboard. Electronically Signed   By: Myles RosenthalJohn  Stahl M.D.   On: 09/20/2018 19:41   Ct Renal Stone Study  Result Date: 08/27/2018 CLINICAL DATA:  Right upper quadrant pain with vomiting. Gallstones. EXAM: CT ABDOMEN AND PELVIS WITHOUT CONTRAST TECHNIQUE: Multidetector CT imaging of the abdomen and pelvis was  performed following the standard protocol without IV contrast. COMPARISON:  03/28/2017 FINDINGS: Lower chest: Mild bibasilar dependent atelectasis. Possible mild circumferential distal esophageal wall thickening which may be due to reflux esophagitis. Hepatobiliary: Mild cholelithiasis. Liver and biliary tree are normal. Pancreas: Normal. Spleen: Normal. Adrenals/Urinary Tract: Adrenal glands are normal. Kidneys are normal in size without hydronephrosis or focal mass. 3 mm nonobstructing stone over the mid pole right kidney. Ureters and bladder are normal. Stomach/Bowel: Stomach and small bowel are unremarkable. Moderate diverticulosis of the colon most prominent over the descending and sigmoid colon. Appendix is normal. Vascular/Lymphatic: Mild calcified plaque over the abdominal aorta. Couple small mesenteric lymph nodes in the midline below the aortic bifurcation with the larger measuring 1.4 cm without significant change. Reproductive: Normal. Other: No free fluid or focal inflammatory change. Small umbilical hernia containing only peritoneal fat. Musculoskeletal: Degenerative change of the spine. Minimal degenerative change of the hips. IMPRESSION: No acute findings in the abdomen/pelvis. Mild cholelithiasis. Single nonobstructing 3 mm right renal stone. Moderate colonic diverticulosis. Small umbilical hernia containing only peritoneal fat. Aortic Atherosclerosis (ICD10-I70.0). Nonspecific 1.4 cm mesenteric lymph node in the midline lower abdomen unchanged. Electronically Signed   By: Elberta Fortisaniel  Boyle M.D.   On: 08/27/2018 11:59   [place] Abdomen Limited Ruq  Result Date: 08/27/2018 CLINICAL DATA:  67 year old male with a history of pain EXAM: ULTRASOUND ABDOMEN LIMITED RIGHT UPPER QUADRANT COMPARISON:  None. FINDINGS: Gallbladder: Echogenic debris in the dependent aspects of the gallbladder, with largest focus measuring 1.3 cm. Negative sonographic Murphy's sign. No gallbladder wall thickening or pericholecystic  fluid. Common bile duct: Diameter: 3 mm-4 mm Liver: Heterogeneous echotexture of liver parenchyma. Portal vein is patent on color Doppler imaging with normal direction of blood flow towards the liver. IMPRESSION: Cholelithiasis without sonographic evidence of acute cholecystitis. Heterogeneous appearance of liver, potentially representing hepatic steatosis Electronically Signed   By: Gilmer MorJaime  Wagner D.O.   On: 08/27/2018 10:18    Note: This dictation was prepared with Dragon/digital dictation along with [employer] technology. Any transcriptional errors that result from this process are unintentional.   .Ardeth SportsmanSteven C. Christien Frankl, M.D., F.A.C.S. Gastrointestinal and Minimally Invasive Surgery [facility] [address] [place], [place] [id number] [phone] Main / Paging  09/20/2018 8:12 PM

## 2018-09-20 NOTE — Progress Notes (Signed)
Called Dr. Derrell LollingIngram about the status of his patient. Patient is moaning in pain and stating "I would have rather died than go through this pain." Dr. Derrell LollingIngram is on his way to see the patient now. Awaiting new orders. Will continue to monitor closely.

## 2018-09-20 NOTE — Anesthesia Postprocedure Evaluation (Signed)
Anesthesia Post Note  Patient: Gabriel Rainwaterimothy Rickert  Procedure(s) Performed: LAPAROSCOPIC CHOLECYSTECTOMY WITH PRIMARY UMBILICAL HERNIA REPAIR (N/A Abdomen)     Patient location during evaluation: PACU Anesthesia Type: General Level of consciousness: awake and alert Pain management: pain level controlled Vital Signs Assessment: post-procedure vital signs reviewed and stable Respiratory status: spontaneous breathing, nonlabored ventilation, respiratory function stable and patient connected to nasal cannula oxygen Cardiovascular status: blood pressure returned to baseline and stable Postop Assessment: no apparent nausea or vomiting Anesthetic complications: no    Last Vitals:  Vitals:   09/20/18 0916 09/20/18 1200  BP: 134/79 116/70  Pulse: 78 80  Resp: 15 18  Temp: 36.7 C 36.8 C  SpO2: 99% 100%    Last Pain:  Vitals:   09/20/18 1200  TempSrc:   PainSc: Asleep                 Trevor IhaStephen A Houser

## 2018-09-20 NOTE — Progress Notes (Signed)
Day of Surgery  Subjective: Patient is having a lot of pain in PACU. Reports right upper quadrant and right shoulder pain. He appears to be in moderate distress.  A little pale.  No diaphoresis.  Answers questions appropriately Lungs clear anteriorly Abdomen relatively soft but with mild discomfort diffusely.  Not really tender to percuss costal margins.  Wounds look okay  Most recent vital signs show heart rate 85.  BP 110/70.  SPO2 97%.  Respiratory rate 22   Objective: Vital signs in last 24 hours: Temp:  [98.1 F (36.7 C)-98.3 F (36.8 C)] 98.3 F (36.8 C) (11/13 1315) Pulse Rate:  [70-85] 85 (11/13 1511) Resp:  [12-22] 22 (11/13 1415) BP: (95-134)/(56-79) 110/70 (11/13 1511) SpO2:  [94 %-100 %] 97 % (11/13 1511) Weight:  [103.9 kg] 103.9 kg (11/13 0923)    Intake/Output from previous day: No intake/output data recorded. Intake/Output this shift: Total I/O In: 1800 [I.V.:1800] Out: 25 [Blood:25]  General appearance: Eyes closed but answers questions appropriately.  Looks slightly pale.  Mild to moderate distress.  Skin warm and dry Resp: clear to auscultation bilaterally GI: Soft.  Mild subjective tenderness.  No peritoneal signs.  Not rigid.  Percussion of costal margins not very tender at all.  Wounds look fine  Lab Results:  Results for orders placed or performed during the hospital encounter of 09/20/18 (from the past 24 hour(s))  Glucose, capillary     Status: Abnormal   Collection Time: 09/20/18  9:07 AM  Result Value Ref Range   Glucose-Capillary 179 (H) 70 - 99 mg/dL  Glucose, capillary     Status: Abnormal   Collection Time: 09/20/18 12:08 PM  Result Value Ref Range   Glucose-Capillary 204 (H) 70 - 99 mg/dL     Studies/Results: No results found.  Marland Kitchen. acetaminophen  1,000 mg Oral Q6H  . Chlorhexidine Gluconate Cloth  6 each Topical Once   And  . Chlorhexidine Gluconate Cloth  6 each Topical Once  . fentaNYL      . fentaNYL      . ketorolac      .  oxyCODONE      . sodium chloride flush  3 mL Intravenous Q12H     Assessment/Plan: s/p Procedure(s): LAPAROSCOPIC CHOLECYSTECTOMY WITH PRIMARY UMBILICAL HERNIA REPAIR  POD 0-  laparoscopic cholecystectomy and primary repair of umbilical hernia-- severe chronic inflammation but no apparent technical problems.  Unable to do cholangiogram, but otherwise case was technically uneventful. Pain is out of proportion to operative findings Could be simply retained CO2 and shoulder pain Could be complications such as bile leak or other   Type 2 diabetes without insulin Hypertension, benign BMI 31 Umbilical hernia, primary repair    Plan: Admit CBC, complete metabolic panel, lipase stat Hepatobiliary scan stat to rule out bile leak Dr. Michaell CowingGross to follow-up this evening to check on progress Discussed differential diagnosis with patient and his cousin    @PROBHOSP @  LOS: 0 days    Ernestene MentionHaywood M Mariateresa Batra 09/20/2018  . .prob

## 2018-09-20 NOTE — Addendum Note (Signed)
Addendum  created 09/20/18 1611 by Trevor IhaHouser, Stephen A, MD   Sign clinical note

## 2018-09-20 NOTE — Anesthesia Postprocedure Evaluation (Signed)
Anesthesia Post Note  Patient: Gabriel Rainwaterimothy Milford  Procedure(s) Performed: LAPAROSCOPIC CHOLECYSTECTOMY WITH PRIMARY UMBILICAL HERNIA REPAIR (N/A Abdomen)     Patient location during evaluation: PACU Anesthesia Type: General Level of consciousness: awake and alert Pain management: pain level not controlled Vital Signs Assessment: post-procedure vital signs reviewed and stable Respiratory status: spontaneous breathing, nonlabored ventilation, respiratory function stable and patient connected to nasal cannula oxygen Cardiovascular status: blood pressure returned to baseline and stable Postop Assessment: no apparent nausea or vomiting Anesthetic complications: no Comments: Post operatively pt c/o of L sided flank pain and can be felt in" back of L shoulder blade.". Unrelieved by movement or toradol.  BP 100/ 60, 12 lead Ekg done SR w PACs no changes from pre op. Dr Oneida ArenasIngraham informed would like to observe patient overnight.     Last Vitals:  Vitals:   09/20/18 1500 09/20/18 1511  BP: (!) 104/56 110/70  Pulse: 82 85  Resp:    Temp:    SpO2: 96% 97%    Last Pain:  Vitals:   09/20/18 1415  TempSrc:   PainSc: 9                  Trevor IhaStephen A Declyn Delsol

## 2018-09-20 NOTE — Interval H&P Note (Signed)
History and Physical Interval Note:  09/20/2018 9:51 AM  Chad Rainwaterimothy Nudd  has presented today for surgery, with the diagnosis of Gallstones,Acute cholelithiasis  The various methods of treatment have been discussed with the patient and family. After consideration of risks, benefits and other options for treatment, the patient has consented to  Procedure(s): LAPAROSCOPIC CHOLECYSTECTOMY WITH INTRAOPERATIVE CHOLANGIOGRAM (N/A) as a surgical intervention .  The patient's history has been reviewed, patient examined, no change in status, stable for surgery.  I have reviewed the patient's chart and labs.  Questions were answered to the patient's satisfaction.     Ernestene MentionHaywood M Jerrald Doverspike

## 2018-09-20 NOTE — Op Note (Signed)
Patient Name:           Chad Cox   Date of Surgery:        09/20/2018  Pre op Diagnosis:      Chronic cholecystitis with cholelithiasis                                       Umbilical hernia  Post op Diagnosis:     Chronic cholecystitis with cholelithiasis                                        Umbilical hernia  Procedure:                 Laparoscopic cholecystectomy                                      Primary repair of umbilical hernia  Surgeon:                     Angelia MouldHaywood M. Derrell LollingIngram, M.D., FACS  Assistant:                      OR staff  Operative Indications:         . This is a pleasant, cooperative, 67 year old man, referred by Dr. Lockie Molauratolo in the EDP for symptomatic gallstones. Warner MccreedyJessica Copeland is his PCP.      He says that he's been having right upper quadrant pain and nausea for a few years but thought it was due to reflux. He had an attack of right upper quadrant pain nausea and vomiting on Labor Day and went to the ER and was told he had gallstones. He was feeling better and chose not to do anything about it. He was in the ER on October 20, 3 days ago with R.U. quadrant pain nausea and vomiting which resolved. There is no aggravating or alleviating factors but sometimes it occurs after eating pizza. ER workup included CT scan which showed small umbilical hernia, gallstones, tiny kidney stone. Ultrasound was otherwise unremarkable. Glucose 329. WBC and LFTs normal. He is now ready to have something done about this. He can still feel something in his right upper quadrant and he's lost his appetite. No fever or chills. No prior abdominal surgery I had a long discussion with him about gallbladder surgery. I think he has fairly classic symptoms and on exam it is still tender in the right upper quadrant somewhat. We will schedule him for surgery urgently. He agrees He'll be scheduled for laparoscopic cholecystectomy with cholangiogram, possible open cholecystectomy  urgently in the near future   Operative Findings:       The gallbladder was severely, chronically inflamed.  It was almost white in color, very thick walled and chronically inflamed.  There were lots of omental adhesions which had to be slowly teased off.  The liver was a little bit heavy but there was no gross abnormality of the liver.  In the area of the cystic duct and cystic artery I was able to create a window but the structures were very scarred, basically occluded and there was no attempt made to do a cholangiogram.  I felt that there would be greater risk to  the biliary structures if I attempt to do this.  With normal liver function test we decided not to do the cholangiogram.  4 quadrant inspection at the beginning of the end of the case revealed no injury bleeding or visceral disease.  Procedure in Detail:          Following the induction of general endotracheal anesthesia, the patient's abdomen was prepped and draped in a sterile fashion.  Surgical timeout was performed.  Intravenous antibiotics were given.  0.5% Marcaine with epinephrine was used as a local infiltration anesthetic.  Because of the umbilical hernia I made a transverse curvilinear incision at the lower end of the umbilicus.  There was incarcerated preperitoneal fat in the hernia which I debrided.  I put stay sutures of 0 Vicryl on the right and the left and inserted an 11 mm Hassan trocar.  Pneumoperitoneum was created.  Video camera was inserted.  There is no sign of bleeding or injury.  A trocar was placed in the subxiphoid region and two 5 mm trochars in the right upper quadrant.  We slowly lifted the liver edge up and teased the omentum down.  We identified the fundus and grasped that and lifted it up.  We then slowly teased all of the omentum off of the gallbladder and infundibulum.  Very thickened peritoneum was encountered on the gallbladder.  I slowly dissected the peritoneum off the neck of the gallbladder and freed the  gallbladder up medially and laterally.  Ultimately I created a window behind the cystic duct and the cystic artery.  These structures were very chronically inflamed, small caliber.  These were isolated individually, secured with multiple metal clips and divided.  Gallbladder was dissected from its bed placed in a specimen bag and removed.      It should be noted that we made one hole in the infundibulum and spilled some light yellow cloudy bile but there were no stones were spilled.     The operative field was copiously inspected and irrigated.  We cauterized a few bleeders.  Irrigation fluid became completely clear and there was no evidence of bleeding or bile leak whatsoever.  We placed the omentum back up into the gallbladder bed.  The trochars were removed and the pneumoperitoneum was released.  We pulled out the stay sutures of the umbilicus.  We closed the umbilical fascia transversely with multiple interrupted sutures of 0 Novafil.  Subtenons tissue below the umbilicus was closed with 3-0 Vicryl sutures.  All the skin incisions were closed with subcuticular 4-0 Monocryl and Dermabond.  Patient tolerated the procedure well was taken to PACU in stable condition.  EBL 20 cc.  Counts correct.  Complications none.    Addendum: I logged onto the [employer] and reviewed his prescription medication history.     Angelia Mould. Derrell Lolling, M.D., FACS General and Minimally Invasive Surgery Breast and Colorectal Surgery  09/20/2018 12:01 PM

## 2018-09-20 NOTE — Transfer of Care (Signed)
2Immediate Anesthesia Transfer of Care Note  Patient: Gabriel Rainwaterimothy Sargent  Procedure(s) Performed: LAPAROSCOPIC CHOLECYSTECTOMY WITH PRIMARY UMBILICAL HERNIA REPAIR (N/A Abdomen)  Patient Location: PACU  Anesthesia Type:General  Level of Consciousness: sedated and drowsy  Airway & Oxygen Therapy: Patient Spontanous Breathing and Patient connected to face mask oxygen  Post-op Assessment: Report given to RN and Post -op Vital signs reviewed and stable  Post vital signs: stable  Last Vitals:  Vitals Value Taken Time  BP 116/70 09/20/2018 12:00 PM  Temp    Pulse 76 09/20/2018 12:01 PM  Resp 21 09/20/2018 12:01 PM  SpO2 99 % 09/20/2018 12:01 PM  Vitals shown include unvalidated device data.  Last Pain:  Vitals:   09/20/18 0916  TempSrc: Oral         Complications: No apparent anesthesia complications

## 2018-09-20 NOTE — Anesthesia Procedure Notes (Signed)
Procedure Name: Intubation Date/Time: 09/20/2018 10:42 AM Performed by: Donna Bernardrimble, Ramyah Pankowski H, CRNA Pre-anesthesia Checklist: Patient identified, Emergency Drugs available, Suction available, Patient being monitored and Timeout performed Patient Re-evaluated:Patient Re-evaluated prior to induction Oxygen Delivery Method: Circle system utilized Preoxygenation: Pre-oxygenation with 100% oxygen Induction Type: IV induction Ventilation: Mask ventilation without difficulty Laryngoscope Size: Miller and 2 Grade View: Grade I Tube type: Oral Tube size: 7.5 mm Number of attempts: 1 Airway Equipment and Method: Stylet Placement Confirmation: positive ETCO2,  ETT inserted through vocal cords under direct vision,  CO2 detector and breath sounds checked- equal and bilateral Secured at: 23 cm Tube secured with: Tape Dental Injury: Teeth and Oropharynx as per pre-operative assessment

## 2018-09-20 NOTE — Discharge Instructions (Signed)
[facility] ______[facility] °LAPAROSCOPIC SURGERY: POST OP INSTRUCTIONS °Always review your discharge instruction sheet given to you by the facility where your surgery was performed. °IF YOU HAVE DISABILITY OR FAMILY LEAVE FORMS, YOU MUST BRING THEM TO THE OFFICE FOR PROCESSING.   °DO NOT GIVE THEM TO YOUR DOCTOR. ° °1. A prescription for pain medication may be given to you upon discharge.  Take your pain medication as prescribed, if needed.  If narcotic pain medicine is not needed, then you may take acetaminophen (Tylenol) or ibuprofen (Advil) as needed. °2. Take your usually prescribed medications unless otherwise directed. °3. If you need a refill on your pain medication, please contact your pharmacy.  They will contact our office to request authorization. Prescriptions will not be filled after 5pm or on week-ends. °4. You should follow a light diet the first few days after arrival home, such as soup and crackers, etc.  Be sure to include lots of fluids daily. °5. Most patients will experience some swelling and bruising in the area of the incisions.  Ice packs will help.  Swelling and bruising can take several days to resolve.  °6. It is common to experience some constipation if taking pain medication after surgery.  Increasing fluid intake and taking a stool softener (such as Colace) will usually help or prevent this problem from occurring.  A mild laxative (Milk of Magnesia or Miralax) should be taken according to package instructions if there are no bowel movements after 48 hours. °7. Unless discharge instructions indicate otherwise, you may remove your bandages 24-48 hours after surgery, and you may shower at that time.  You may have steri-strips (small skin tapes) in place directly over the incision.  These strips should be left on the skin for 7-10 days.  If your surgeon used skin glue on the incision, you may shower in 24 hours.  The glue will flake off over the next 2-3 weeks.  Any sutures or  staples will be removed at the office during your follow-up visit. °8. ACTIVITIES:  You may resume regular (light) daily activities beginning the next day--such as daily self-care, walking, climbing stairs--gradually increasing activities as tolerated.  You may have sexual intercourse when it is comfortable.  Refrain from any heavy lifting or straining until approved by your doctor. °a. You may drive when you are no longer taking prescription pain medication, you can comfortably wear a seatbelt, and you can safely maneuver your car and apply brakes. °b. RETURN TO WORK:  __________________________________________________________ °9. You should see your doctor in the office for a follow-up appointment approximately 2-3 weeks after your surgery.  Make sure that you call for this appointment within a day or two after you arrive home to insure a convenient appointment time. °10. OTHER INSTRUCTIONS: __________________________________________________________________________________________________________________________ __________________________________________________________________________________________________________________________ °WHEN TO CALL YOUR DOCTOR: °1. Fever over 101.0 °2. Inability to urinate °3. Continued bleeding from incision. °4. Increased pain, redness, or drainage from the incision. °5. Increasing abdominal pain ° °The clinic staff is available to answer your questions during regular business hours.  Please don’t hesitate to call and ask to speak to one of the nurses for clinical concerns.  If you have a medical emergency, go to the nearest emergency room or call 911.  A surgeon from [facility] is always on call at the hospital. °[address], [address], [place], [place]  [address] ? [address] [address], [place], [place]   [address] °[phone] ? [phone] ? FAX [phone] °Web site:   [email or web address] ______[facility] °LAPAROSCOPIC  SURGERY: POST OP INSTRUCTIONS °Always review your discharge instruction sheet given to you by the facility where your surgery was performed. °IF YOU HAVE DISABILITY OR FAMILY LEAVE FORMS, YOU MUST BRING THEM TO THE OFFICE FOR PROCESSING.   °DO NOT GIVE THEM TO YOUR DOCTOR. ° °11. A prescription for pain medication may be given to you upon discharge.  Take your pain medication as prescribed, if needed.  If narcotic pain medicine is not needed, then you may take acetaminophen (Tylenol) or ibuprofen (Advil) as needed. °12. Take your usually prescribed medications unless otherwise directed. °13. If you need a refill on your pain medication, please contact your pharmacy.  They will contact our office to request authorization. Prescriptions will not be filled after 5pm or on week-ends. °14. You should follow a light diet the first few days after arrival home, such as soup and crackers, etc.  Be sure to include lots of fluids daily. °15. Most patients will experience some swelling and bruising in the area of the incisions.  Ice packs will help.  Swelling and bruising can take several days to resolve.  °16. It is common to experience some constipation if taking pain medication after surgery.  Increasing fluid intake and taking a stool softener (such as Colace) will usually help or prevent this problem from occurring.  A mild laxative (Milk of Magnesia or Miralax) should be taken according to package instructions if there are no bowel movements after 48 hours. °17. Unless discharge instructions indicate otherwise, you may remove your bandages 24-48 hours after surgery, and you may shower at that time.  You may have steri-strips (small skin tapes) in place directly over the incision.  These strips should be left on the skin for 7-10 days.  If your surgeon used skin glue on the incision, you may shower in 24 hours.  The glue will flake off over the next 2-3 weeks.  Any sutures or staples will be removed at the office during  your follow-up visit. °18. ACTIVITIES:  You may resume regular (light) daily activities beginning the next day--such as daily self-care, walking, climbing stairs--gradually increasing activities as tolerated.  You may have sexual intercourse when it is comfortable.  Refrain from any heavy lifting or straining until approved by your doctor. °a. You may drive when you are no longer taking prescription pain medication, you can comfortably wear a seatbelt, and you can safely maneuver your car and apply brakes. °b. RETURN TO WORK:  __________________________________________________________ °19. You should see your doctor in the office for a follow-up appointment approximately 2-3 weeks after your surgery.  Make sure that you call for this appointment within a day or two after you arrive home to insure a convenient appointment time. °20. OTHER INSTRUCTIONS: __________________________________________________________________________________________________________________________ __________________________________________________________________________________________________________________________ °WHEN TO CALL YOUR DOCTOR: °6. Fever over 101.0 °7. Inability to urinate °8. Continued bleeding from incision. °9. Increased pain, redness, or drainage from the incision. °10. Increasing abdominal pain ° °The clinic staff is available to answer your questions during regular business hours.  Please don’t hesitate to call and ask to speak to one of the nurses for clinical concerns.  If you have a medical emergency, go to the nearest emergency room or call 911.  A surgeon from [facility] is always on call at the hospital. °[address], [address], [place], [place]  [address] ? [address] [address], [place], [place]   [address] °[phone] ? [phone] ? FAX [phone] °Web   site: [email or web address] °

## 2018-09-20 NOTE — Progress Notes (Signed)
Pharmacy Antibiotic Note  Chad Cox is a 67 y.o. male admitted on 09/20/2018 with intra-abdominal infection.  Pharmacy has been consulted for piperacillin/tazobactam dosing. No antibiotic allergies.  Patient underwent laparoscopic cholecystectomy with primary umbilical hernia repair today. Pt received cefazolin for surgical prophylaxis. Being started on piperacillin/tazobactam post-op.   Today, 09/20/18  WBC 17.8, elevated  Afebrile  SCr 1.12, CrCl ~78 mL/min  Plan:  Piperacillin/tazobactam 3.375 g IV q8h EI  Follow culture data, clinical course, renal function  Height: 5\' 10"  (177.8 cm) Weight: 229 lb (103.9 kg) IBW/kg (Calculated) : 73  Temp (24hrs), Avg:98.1 F (36.7 C), Min:97.6 F (36.4 C), Max:98.3 F (36.8 C)  Recent Labs  Lab 09/18/18 0823 09/20/18 1539  WBC 8.3 17.8*  CREATININE 1.04 1.12    Estimated Creatinine Clearance: 78.4 mL/min (by C-G formula based on SCr of 1.12 mg/dL).    Allergies  Allergen Reactions  . Adhesive [Tape] Other (See Comments)    "acts like poison ivy" blisters up, itching  . Betadine [Povidone Iodine] Other (See Comments)    Blisters, itching   Antimicrobials this admission: Piperacillin/tazobactam 11/13 >>  Cefazolin for surgical prophylaxis 11/13  Dose adjustments this admission:  Microbiology results: None  Thank you for allowing pharmacy to be a part of this patient's care.  Cindi CarbonMary M Telena Peyser, PharmD, BCPS Clinical Pharmacist 09/20/2018 8:26 PM

## 2018-09-21 ENCOUNTER — Encounter (HOSPITAL_COMMUNITY)
Admission: AD | Disposition: A | Discharge status: Home / Self Care | Payer: Self-pay | Source: Ambulatory Visit | Attending: General Surgery

## 2018-09-21 ENCOUNTER — Encounter (HOSPITAL_COMMUNITY): Payer: Self-pay | Admitting: General Surgery

## 2018-09-21 ENCOUNTER — Ambulatory Visit (HOSPITAL_COMMUNITY): Payer: BLUE CROSS/BLUE SHIELD

## 2018-09-21 ENCOUNTER — Ambulatory Visit (HOSPITAL_COMMUNITY): Payer: BLUE CROSS/BLUE SHIELD | Admitting: Registered Nurse

## 2018-09-21 ENCOUNTER — Ambulatory Visit (HOSPITAL_COMMUNITY): Payer: BLUE CROSS/BLUE SHIELD | Admitting: Anesthesiology

## 2018-09-21 DIAGNOSIS — K838 Other specified diseases of biliary tract: Secondary | ICD-10-CM | POA: Diagnosis not present

## 2018-09-21 DIAGNOSIS — R933 Abnormal findings on diagnostic imaging of other parts of digestive tract: Secondary | ICD-10-CM | POA: Diagnosis not present

## 2018-09-21 DIAGNOSIS — R1011 Right upper quadrant pain: Secondary | ICD-10-CM | POA: Diagnosis not present

## 2018-09-21 DIAGNOSIS — K9189 Other postprocedural complications and disorders of digestive system: Secondary | ICD-10-CM | POA: Diagnosis not present

## 2018-09-21 DIAGNOSIS — K915 Postcholecystectomy syndrome: Secondary | ICD-10-CM | POA: Diagnosis not present

## 2018-09-21 DIAGNOSIS — K5730 Diverticulosis of large intestine without perforation or abscess without bleeding: Secondary | ICD-10-CM | POA: Diagnosis not present

## 2018-09-21 DIAGNOSIS — K832 Perforation of bile duct: Secondary | ICD-10-CM | POA: Diagnosis not present

## 2018-09-21 HISTORY — PX: ERCP: SHX5425

## 2018-09-21 HISTORY — PX: LAPAROSCOPY: SHX197

## 2018-09-21 LAB — COMPREHENSIVE METABOLIC PANEL
ALT: 33 U/L (ref 0–44)
AST: 31 U/L (ref 15–41)
Albumin: 3.5 g/dL (ref 3.5–5.0)
Alkaline Phosphatase: 45 U/L (ref 38–126)
Anion gap: 8 (ref 5–15)
BUN: 23 mg/dL (ref 8–23)
CO2: 24 mmol/L (ref 22–32)
Calcium: 8.4 mg/dL — ABNORMAL LOW (ref 8.9–10.3)
Chloride: 102 mmol/L (ref 98–111)
Creatinine, Ser: 1.08 mg/dL (ref 0.61–1.24)
GFR calc Af Amer: >60 mL/min (ref >60)
GFR calc non Af Amer: >60 mL/min (ref >60)
Glucose, Bld: 275 mg/dL — ABNORMAL HIGH (ref 70–99)
Potassium: 4.6 mmol/L (ref 3.5–5.1)
Sodium: 134 mmol/L — ABNORMAL LOW (ref 135–145)
Total Bilirubin: 0.7 mg/dL (ref 0.3–1.2)
Total Protein: 6.2 g/dL — ABNORMAL LOW (ref 6.5–8.1)

## 2018-09-21 LAB — GLUCOSE, CAPILLARY
Glucose-Capillary: 209 mg/dL — ABNORMAL HIGH (ref 70–99)
Glucose-Capillary: 186 mg/dL — ABNORMAL HIGH (ref 70–99)
Glucose-Capillary: 188 mg/dL — ABNORMAL HIGH (ref 70–99)

## 2018-09-21 LAB — LIPASE, BLOOD: Lipase: 30 U/L (ref 11–51)

## 2018-09-21 LAB — MRSA PCR SCREENING: MRSA by PCR: NEGATIVE

## 2018-09-21 SURGERY — ERCP, WITH INTERVENTION IF INDICATED
Anesthesia: General

## 2018-09-21 SURGERY — LAPAROSCOPY, DIAGNOSTIC
Anesthesia: General

## 2018-09-21 MED ORDER — ROCURONIUM BROMIDE 10 MG/ML (PF) SYRINGE
PREFILLED_SYRINGE | INTRAVENOUS | Status: DC | PRN
  Administered 2018-09-21: 50 mg via INTRAVENOUS

## 2018-09-21 MED ORDER — ROCURONIUM BROMIDE 10 MG/ML (PF) SYRINGE
PREFILLED_SYRINGE | INTRAVENOUS | Status: DC | PRN
  Administered 2018-09-21: 40 mg via INTRAVENOUS

## 2018-09-21 MED ORDER — BUPIVACAINE-EPINEPHRINE (PF) 0.25% -1:200000 IJ SOLN
INTRAMUSCULAR | Status: DC | PRN
  Administered 2018-09-21: 2 mL

## 2018-09-21 MED ORDER — CEFAZOLIN SODIUM-DEXTROSE 2-3 GM-%(50ML) IV SOLR
INTRAVENOUS | Status: DC | PRN
  Administered 2018-09-21: 2 g via INTRAVENOUS

## 2018-09-21 MED ORDER — SUGAMMADEX SODIUM 200 MG/2ML IV SOLN
INTRAVENOUS | Status: DC | PRN
  Administered 2018-09-21: 425 mg via INTRAVENOUS

## 2018-09-21 MED ORDER — ONDANSETRON HCL 4 MG/2ML IJ SOLN
INTRAMUSCULAR | Status: DC | PRN
  Administered 2018-09-21: 4 mg via INTRAVENOUS

## 2018-09-21 MED ORDER — PROPOFOL 10 MG/ML IV BOLUS
INTRAVENOUS | Status: DC | PRN
  Administered 2018-09-21: 110 mg via INTRAVENOUS

## 2018-09-21 MED ORDER — FENTANYL CITRATE (PF) 100 MCG/2ML IJ SOLN
INTRAMUSCULAR | Status: DC | PRN
  Administered 2018-09-21: 100 ug via INTRAVENOUS

## 2018-09-21 MED ORDER — BUPIVACAINE-EPINEPHRINE (PF) 0.25% -1:200000 IJ SOLN
INTRAMUSCULAR | Status: AC
  Filled 2018-09-21: qty 30

## 2018-09-21 MED ORDER — MIDAZOLAM HCL 2 MG/2ML IJ SOLN
INTRAMUSCULAR | Status: AC
  Filled 2018-09-21: qty 2

## 2018-09-21 MED ORDER — PROPOFOL 10 MG/ML IV BOLUS
INTRAVENOUS | Status: AC
  Filled 2018-09-21: qty 20

## 2018-09-21 MED ORDER — IOHEXOL 300 MG/ML  SOLN
100.0000 mL | Freq: Once | INTRAMUSCULAR | Status: AC | PRN
  Administered 2018-09-21: 100 mL via INTRAVENOUS

## 2018-09-21 MED ORDER — ONDANSETRON HCL 4 MG/2ML IJ SOLN
INTRAMUSCULAR | Status: AC
  Filled 2018-09-21: qty 2

## 2018-09-21 MED ORDER — GLIPIZIDE 5 MG PO TABS
2.5000 mg | ORAL_TABLET | Freq: Every day | ORAL | Status: DC
  Administered 2018-09-22 – 2018-09-26 (×5): 2.5 mg via ORAL
  Filled 2018-09-21: qty 0.5
  Filled 2018-09-21 (×3): qty 1
  Filled 2018-09-21: qty 0.5

## 2018-09-21 MED ORDER — PHENYLEPHRINE HCL 10 MG/ML IJ SOLN
INTRAMUSCULAR | Status: AC
  Filled 2018-09-21: qty 1

## 2018-09-21 MED ORDER — LIDOCAINE 2% (20 MG/ML) 5 ML SYRINGE
INTRAMUSCULAR | Status: DC | PRN
  Administered 2018-09-21: 100 mg via INTRAVENOUS

## 2018-09-21 MED ORDER — DEXAMETHASONE SODIUM PHOSPHATE 10 MG/ML IJ SOLN
INTRAMUSCULAR | Status: AC
  Filled 2018-09-21: qty 1

## 2018-09-21 MED ORDER — SUGAMMADEX SODIUM 500 MG/5ML IV SOLN
INTRAVENOUS | Status: DC | PRN
  Administered 2018-09-21: 400 mg via INTRAVENOUS

## 2018-09-21 MED ORDER — FENTANYL CITRATE (PF) 100 MCG/2ML IJ SOLN
INTRAMUSCULAR | Status: DC | PRN
  Administered 2018-09-21: 50 ug via INTRAVENOUS

## 2018-09-21 MED ORDER — SODIUM CHLORIDE (PF) 0.9 % IJ SOLN
INTRAMUSCULAR | Status: AC
  Filled 2018-09-21: qty 50

## 2018-09-21 MED ORDER — GLUCAGON HCL RDNA (DIAGNOSTIC) 1 MG IJ SOLR
INTRAMUSCULAR | Status: AC
  Filled 2018-09-21: qty 1

## 2018-09-21 MED ORDER — GLUCAGON HCL RDNA (DIAGNOSTIC) 1 MG IJ SOLR
INTRAMUSCULAR | Status: DC | PRN
  Administered 2018-09-21: .5 mg via INTRAVENOUS

## 2018-09-21 MED ORDER — SUCCINYLCHOLINE CHLORIDE 200 MG/10ML IV SOSY
PREFILLED_SYRINGE | INTRAVENOUS | Status: DC | PRN
  Administered 2018-09-21: 200 mg via INTRAVENOUS

## 2018-09-21 MED ORDER — MIDAZOLAM HCL 5 MG/5ML IJ SOLN
INTRAMUSCULAR | Status: DC | PRN
  Administered 2018-09-21 (×2): 1 mg via INTRAVENOUS

## 2018-09-21 MED ORDER — PHENYLEPHRINE HCL 10 MG/ML IJ SOLN
INTRAMUSCULAR | Status: DC | PRN
  Administered 2018-09-21 (×2): 120 ug via INTRAVENOUS

## 2018-09-21 MED ORDER — PHENYLEPHRINE 40 MCG/ML (10ML) SYRINGE FOR IV PUSH (FOR BLOOD PRESSURE SUPPORT)
PREFILLED_SYRINGE | INTRAVENOUS | Status: DC | PRN
  Administered 2018-09-21 (×2): 80 ug via INTRAVENOUS
  Administered 2018-09-21: 120 ug via INTRAVENOUS
  Administered 2018-09-21: 160 ug via INTRAVENOUS

## 2018-09-21 MED ORDER — SODIUM CHLORIDE 0.9 % IV SOLN: INTRAVENOUS | Status: DC

## 2018-09-21 MED ORDER — INDOMETHACIN 50 MG RE SUPP
RECTAL | Status: AC
  Filled 2018-09-21: qty 2

## 2018-09-21 MED ORDER — SODIUM CHLORIDE 0.9 % IV SOLN
INTRAVENOUS | Status: DC | PRN
  Administered 2018-09-21: 25 ug/min via INTRAVENOUS

## 2018-09-21 MED ORDER — LACTATED RINGERS IV SOLN
INTRAVENOUS | Status: DC
  Administered 2018-09-22 – 2018-09-23 (×4): via INTRAVENOUS
  Administered 2018-09-24: 1000 mL via INTRAVENOUS

## 2018-09-21 MED ORDER — INSULIN GLARGINE 100 UNIT/ML ~~LOC~~ SOLN
10.0000 [IU] | Freq: Every day | SUBCUTANEOUS | Status: DC
  Administered 2018-09-21 – 2018-09-25 (×5): 10 [IU] via SUBCUTANEOUS
  Filled 2018-09-21 (×5): qty 0.1

## 2018-09-21 MED ORDER — INSULIN ASPART 100 UNIT/ML ~~LOC~~ SOLN
0.0000 [IU] | SUBCUTANEOUS | Status: DC
  Administered 2018-09-21: 5 [IU] via SUBCUTANEOUS
  Administered 2018-09-21: 3 [IU] via SUBCUTANEOUS
  Administered 2018-09-22: 2 [IU] via SUBCUTANEOUS
  Administered 2018-09-22: 3 [IU] via SUBCUTANEOUS
  Administered 2018-09-22: 2 [IU] via SUBCUTANEOUS
  Administered 2018-09-22: 3 [IU] via SUBCUTANEOUS
  Administered 2018-09-22 – 2018-09-24 (×4): 2 [IU] via SUBCUTANEOUS

## 2018-09-21 MED ORDER — LACTATED RINGERS IV SOLN
INTRAVENOUS | Status: DC | PRN
  Administered 2018-09-21 (×2): via INTRAVENOUS

## 2018-09-21 MED ORDER — LACTATED RINGERS IV SOLN
INTRAVENOUS | Status: DC | PRN
  Administered 2018-09-21: 20:00:00 via INTRAVENOUS

## 2018-09-21 MED ORDER — LACTATED RINGERS IR SOLN
Status: DC | PRN
  Administered 2018-09-21: 3000 mL

## 2018-09-21 MED ORDER — FENTANYL CITRATE (PF) 250 MCG/5ML IJ SOLN
INTRAMUSCULAR | Status: AC
  Filled 2018-09-21: qty 5

## 2018-09-21 MED ORDER — PIPERACILLIN-TAZOBACTAM 3.375 G IVPB
INTRAVENOUS | Status: AC
  Filled 2018-09-21: qty 50

## 2018-09-21 MED ORDER — PROPOFOL 10 MG/ML IV BOLUS
INTRAVENOUS | Status: DC | PRN
  Administered 2018-09-21: 120 mg via INTRAVENOUS

## 2018-09-21 MED ORDER — SODIUM CHLORIDE 0.9 % IV SOLN
INTRAVENOUS | Status: DC | PRN
  Administered 2018-09-21: 40 ug/min via INTRAVENOUS

## 2018-09-21 MED ORDER — FENTANYL CITRATE (PF) 100 MCG/2ML IJ SOLN
INTRAMUSCULAR | Status: AC
  Filled 2018-09-21: qty 2

## 2018-09-21 SURGICAL SUPPLY — 51 items
APPLIER CLIP 5 13 M/L LIGAMAX5 (MISCELLANEOUS) ×2
APPLIER CLIP ROT 10 11.4 M/L (STAPLE)
BLADE EXTENDED COATED 6.5IN (ELECTRODE) IMPLANT
CLIP APPLIE 5 13 M/L LIGAMAX5 (MISCELLANEOUS) ×1 IMPLANT
CLIP APPLIE ROT 10 11.4 M/L (STAPLE) IMPLANT
COVER MAYO STAND STRL (DRAPES) IMPLANT
COVER WAND RF STERILE (DRAPES) ×2 IMPLANT
DECANTER SPIKE VIAL GLASS SM (MISCELLANEOUS) ×2 IMPLANT
DERMABOND ADVANCED (GAUZE/BANDAGES/DRESSINGS) ×1
DERMABOND ADVANCED .7 DNX12 (GAUZE/BANDAGES/DRESSINGS) ×1 IMPLANT
DRAIN CHANNEL 19F RND (DRAIN) ×2 IMPLANT
DRSG TEGADERM 4X4.75 (GAUZE/BANDAGES/DRESSINGS) ×2 IMPLANT
ELECT PENCIL ROCKER SW 15FT (MISCELLANEOUS) IMPLANT
ELECT REM PT RETURN 15FT ADLT (MISCELLANEOUS) ×2 IMPLANT
EVACUATOR SILICONE 100CC (DRAIN) ×2 IMPLANT
GAUZE SPONGE 2X2 8PLY STRL LF (GAUZE/BANDAGES/DRESSINGS) ×1 IMPLANT
GAUZE SPONGE 4X4 12PLY STRL (GAUZE/BANDAGES/DRESSINGS) IMPLANT
GLOVE BIO SURGEON STRL SZ 6.5 (GLOVE) ×2 IMPLANT
GLOVE BIOGEL PI IND STRL 7.0 (GLOVE) ×1 IMPLANT
GLOVE BIOGEL PI INDICATOR 7.0 (GLOVE) ×1
GLOVE INDICATOR 6.5 STRL GRN (GLOVE) ×2 IMPLANT
GOWN STRL REUS W/TWL 2XL LVL3 (GOWN DISPOSABLE) ×2 IMPLANT
GOWN STRL REUS W/TWL XL LVL3 (GOWN DISPOSABLE) ×2 IMPLANT
HANDLE SUCTION POOLE (INSTRUMENTS) IMPLANT
IRRIG SUCT STRYKERFLOW 2 WTIP (MISCELLANEOUS) ×2
IRRIGATION SUCT STRKRFLW 2 WTP (MISCELLANEOUS) ×1 IMPLANT
NEEDLE INSUFFLATION 14GA 150MM (NEEDLE) ×2 IMPLANT
SEALER TISSUE G2 STRG ARTC 35C (ENDOMECHANICALS) IMPLANT
SLEEVE XCEL OPT CAN 5 100 (ENDOMECHANICALS) ×6 IMPLANT
SPONGE GAUZE 2X2 STER 10/PKG (GAUZE/BANDAGES/DRESSINGS) ×1
SPONGE LAP 18X18 RF (DISPOSABLE) IMPLANT
STAPLER VISISTAT 35W (STAPLE) IMPLANT
SUCTION POOLE HANDLE (INSTRUMENTS)
SUT ETHILON 2 0 PS N (SUTURE) ×2 IMPLANT
SUT NOVA 1 T20/GS 25DT (SUTURE) IMPLANT
SUT PDS AB 1 TP1 96 (SUTURE) IMPLANT
SUT PROLENE 2 0 KS (SUTURE) IMPLANT
SUT PROLENE 2 0 SH DA (SUTURE) IMPLANT
SUT SILK 2 0 (SUTURE)
SUT SILK 2 0 SH CR/8 (SUTURE) IMPLANT
SUT SILK 2-0 18XBRD TIE 12 (SUTURE) IMPLANT
SUT SILK 3 0 (SUTURE)
SUT SILK 3 0 SH CR/8 (SUTURE) IMPLANT
SUT SILK 3-0 18XBRD TIE 12 (SUTURE) IMPLANT
TOWEL OR 17X26 10 PK STRL BLUE (TOWEL DISPOSABLE) ×2 IMPLANT
TOWEL OR NON WOVEN STRL DISP B (DISPOSABLE) ×2 IMPLANT
TRAY FOLEY MTR SLVR 16FR STAT (SET/KITS/TRAYS/PACK) IMPLANT
TRAY LAPAROSCOPIC (CUSTOM PROCEDURE TRAY) ×2 IMPLANT
TROCAR BLADELESS OPT 5 100 (ENDOMECHANICALS) ×2 IMPLANT
TROCAR XCEL BLUNT TIP 100MML (ENDOMECHANICALS) IMPLANT
TROCAR XCEL NON-BLD 11X100MML (ENDOMECHANICALS) IMPLANT

## 2018-09-21 NOTE — Anesthesia Preprocedure Evaluation (Addendum)
Anesthesia Evaluation  Patient identified by MRN, date of birth, ID band Patient awake    Reviewed: Allergy & Precautions, NPO status , Patient's Chart, lab work & pertinent test results  Airway Mallampati: II  TM Distance: >3 FB Neck ROM: Full    Dental no notable dental hx. (+) Teeth Intact, Dental Advisory Given,    Pulmonary neg pulmonary ROS,    Pulmonary exam normal breath sounds clear to auscultation       Cardiovascular hypertension, Pt. on medications negative cardio ROS Normal cardiovascular exam Rhythm:Regular Rate:Normal     Neuro/Psych negative neurological ROS  negative psych ROS   GI/Hepatic negative GI ROS, Neg liver ROS,   Endo/Other  negative endocrine ROSdiabetes, Type 2, Insulin Dependent, Oral Hypoglycemic Agents  Renal/GU negative Renal ROS  negative genitourinary   Musculoskeletal negative musculoskeletal ROS (+)   Abdominal   Peds  Hematology negative hematology ROS (+)   Anesthesia Other Findings POD1 lap chole now with bile leak. Unsuccessful ERCP earlier today now presents for washout and drain placement.   Reproductive/Obstetrics                            Anesthesia Physical  Anesthesia Plan  ASA: III  Anesthesia Plan: General   Post-op Pain Management:    Induction: Intravenous, Rapid sequence and Cricoid pressure planned  PONV Risk Score and Plan: 2 and Treatment may vary due to age or medical condition and Ondansetron  Airway Management Planned: Oral ETT  Additional Equipment:   Intra-op Plan:   Post-operative Plan: Extubation in OR  Informed Consent: I have reviewed the patients History and Physical, chart, labs and discussed the procedure including the risks, benefits and alternatives for the proposed anesthesia with the patient or authorized representative who has indicated his/her understanding and acceptance.   Dental advisory  given  Plan Discussed with: CRNA  Anesthesia Plan Comments:         Anesthesia Quick Evaluation

## 2018-09-21 NOTE — Progress Notes (Addendum)
Patient ID: Chad Cox, male   DOB: 1951/07/11, 67 y.o.   MRN: [id number] Patient of Dr Aura CampsIngrams with biliary leak.  Unable to complete ercp. . Patient right now post ercp with pulse of 82 bp 100/65 and tender ruq.  No peritonitis.  I have discussed with Dr Derrell LollingIngram and Dr Elnoria HowardHung. He likely needs to return to OR for drain placement and washout.  Will reevaluate after next case

## 2018-09-21 NOTE — H&P (View-Only) (Signed)
1 Day Post-Op  Subjective: Stable and alert.  Cousin present with him in room. Still has pain, right upper quadrant and right shoulder Afebrile.  Heart rate 96.  Respirations 20.  BP 120/62. LFTs remain normal this morning.  Glucose 275.  WBC 15,300.  Hemoglobin 10.6.  Stable.  Lipase normal.  Hepatobiliary scan positive for bile leak.  Tracer did not go into the duodenum. Dr. Wendall Papaan Jacobs contacted last night to consider ERCP with stenting today.   Objective: Vital signs in last 24 hours: Temp:  [97.6 F (36.4 C)-98.3 F (36.8 C)] 98.3 F (36.8 C) (11/14 0528) Pulse Rate:  [70-96] 96 (11/14 0528) Resp:  [12-22] 20 (11/14 0528) BP: (95-134)/(54-79) 120/60 (11/14 0528) SpO2:  [92 %-100 %] 92 % (11/14 0528) Weight:  [103.9 kg] 103.9 kg (11/13 0923)    Intake/Output from previous day: 11/13 0701 - 11/14 0700 In: 4036.7 [P.O.:120; I.V.:2912.9; IV Piggyback:1003.8] Out: 525 [Urine:500; Blood:25] Intake/Output this shift: No intake/output data recorded.  General appearance: Alert.  Oriented.  Less sedated than last night.  Does not feel well but does not look toxic. Resp: clear to auscultation bilaterally GI: Soft.  Borderline distended.  Mild diffuse tenderness.  More guarding in right upper quadrant.  Not rigid.  Wounds okay  Lab Results:  Results for orders placed or performed during the hospital encounter of 09/20/18 (from the past 24 hour(s))  Glucose, capillary     Status: Abnormal   Collection Time: 09/20/18  9:07 AM  Result Value Ref Range   Glucose-Capillary 179 (H) 70 - 99 mg/dL  Glucose, capillary     Status: Abnormal   Collection Time: 09/20/18 12:08 PM  Result Value Ref Range   Glucose-Capillary 204 (H) 70 - 99 mg/dL  CBC     Status: Abnormal   Collection Time: 09/20/18  3:39 PM  Result Value Ref Range   WBC 17.8 (H) 4.0 - 10.5 K/uL   RBC 3.05 (L) 4.22 - 5.81 MIL/uL   Hemoglobin 10.3 (L) 13.0 - 17.0 g/dL   HCT 16.130.3 (L) 09.639.0 - 04.552.0 %   MCV 99.3 80.0 - 100.0 fL    MCH 33.8 26.0 - 34.0 pg   MCHC 34.0 30.0 - 36.0 g/dL   RDW 40.912.6 81.111.5 - 91.415.5 %   Platelets 227 150 - 400 K/uL   nRBC 0.0 0.0 - 0.2 %  Comprehensive metabolic panel     Status: Abnormal   Collection Time: 09/20/18  3:39 PM  Result Value Ref Range   Sodium 133 (L) 135 - 145 mmol/L   Potassium 3.9 3.5 - 5.1 mmol/L   Chloride 102 98 - 111 mmol/L   CO2 21 (L) 22 - 32 mmol/L   Glucose, Bld 301 (H) 70 - 99 mg/dL   BUN 16 8 - 23 mg/dL   Creatinine, Ser 7.821.12 0.61 - 1.24 mg/dL   Calcium 8.4 (L) 8.9 - 10.3 mg/dL   Total Protein 6.7 6.5 - 8.1 g/dL   Albumin 3.9 3.5 - 5.0 g/dL   AST 32 15 - 41 U/L   ALT 35 0 - 44 U/L   Alkaline Phosphatase 57 38 - 126 U/L   Total Bilirubin 0.7 0.3 - 1.2 mg/dL   GFR calc non Af Amer >60 >60 mL/min   GFR calc Af Amer >60 >60 mL/min   Anion gap 10 5 - 15  Lipase, blood     Status: None   Collection Time: 09/20/18  3:39 PM  Result Value Ref Range  Lipase 32 11 - 51 U/L  Glucose, capillary     Status: Abnormal   Collection Time: 09/20/18  9:21 PM  Result Value Ref Range   Glucose-Capillary 274 (H) 70 - 99 mg/dL   Comment 1 Notify RN    Comment 2 Document in Chart   CBC     Status: Abnormal   Collection Time: 09/20/18 10:19 PM  Result Value Ref Range   WBC 15.3 (H) 4.0 - 10.5 K/uL   RBC 3.20 (L) 4.22 - 5.81 MIL/uL   Hemoglobin 10.6 (L) 13.0 - 17.0 g/dL   HCT 16.1 (L) 09.6 - 04.5 %   MCV 99.7 80.0 - 100.0 fL   MCH 33.1 26.0 - 34.0 pg   MCHC 33.2 30.0 - 36.0 g/dL   RDW 40.9 81.1 - 91.4 %   Platelets 231 150 - 400 K/uL   nRBC 0.0 0.0 - 0.2 %  Protime-INR     Status: None   Collection Time: 09/20/18 10:19 PM  Result Value Ref Range   Prothrombin Time 13.7 11.4 - 15.2 seconds   INR 1.06   Comprehensive metabolic panel     Status: Abnormal   Collection Time: 09/21/18  3:38 AM  Result Value Ref Range   Sodium 134 (L) 135 - 145 mmol/L   Potassium 4.6 3.5 - 5.1 mmol/L   Chloride 102 98 - 111 mmol/L   CO2 24 22 - 32 mmol/L   Glucose, Bld 275 (H) 70  - 99 mg/dL   BUN 23 8 - 23 mg/dL   Creatinine, Ser 7.82 0.61 - 1.24 mg/dL   Calcium 8.4 (L) 8.9 - 10.3 mg/dL   Total Protein 6.2 (L) 6.5 - 8.1 g/dL   Albumin 3.5 3.5 - 5.0 g/dL   AST 31 15 - 41 U/L   ALT 33 0 - 44 U/L   Alkaline Phosphatase 45 38 - 126 U/L   Total Bilirubin 0.7 0.3 - 1.2 mg/dL   GFR calc non Af Amer >60 >60 mL/min   GFR calc Af Amer >60 >60 mL/min   Anion gap 8 5 - 15  Lipase, blood     Status: None   Collection Time: 09/21/18  3:38 AM  Result Value Ref Range   Lipase 30 11 - 51 U/L     Studies/Results: Nm Hepatobiliary Liver Func  Result Date: 09/20/2018 CLINICAL DATA:  Status post cholecystectomy today. Abdominal pain. Evaluate for postop bile leak. EXAM: NUCLEAR MEDICINE HEPATOBILIARY IMAGING TECHNIQUE: Sequential images of the abdomen were obtained out to 60 minutes following intravenous administration of radiopharmaceutical. RADIOPHARMACEUTICALS:  5.0 mCi Tc-58m  Choletec IV COMPARISON:  None. FINDINGS: Prompt uptake and biliary excretion of activity by the liver is seen. Gallbladder is not visualized, consistent with prior cholecystectomy. Excreted biliary activity does not enter the small bowel, but passes into area of the porta hepatis and Morison's pouch, consistent with postop bile leak. IMPRESSION: Prior cholecystectomy. Positive for postop bile leak in the right upper quadrant. These results will be called to the ordering clinician or representative by the Radiologist Assistant, and communication documented in the PACS or zVision Dashboard. Electronically Signed   By: Myles Rosenthal M.D.   On: 09/20/2018 19:41    . acetaminophen  1,000 mg Oral TID  . aspirin EC  81 mg Oral Daily  . FLUoxetine  40 mg Oral Daily  . gabapentin  300 mg Oral BID  . glipiZIDE  2.5 mg Oral QAC breakfast  . insulin aspart  0-15 Units  Subcutaneous TID WC  . insulin aspart  0-5 Units Subcutaneous QHS  . lip balm  1 application Topical BID  . lisinopril  20 mg Oral Daily  .  metFORMIN  1,000 mg Oral BID WC  . pantoprazole  40 mg Oral BID AC  . pravastatin  80 mg Oral Daily     Assessment/Plan: s/p Procedure(s): LAPAROSCOPIC CHOLECYSTECTOMY WITH PRIMARY UMBILICAL HERNIA REPAIR  POD 1-  laparoscopic cholecystectomy and primary repair of umbilical hernia-- severe chronic inflammation but no apparent technical problems.  Unable to do cholangiogram, but otherwise case was technically uneventful. Hepatobiliary scan positive for bile leak. Clinically case went well technically and no bleeding or bile leak intraoperative. ERCP with stenting today.  Dr. Jesusita Oka [facility] to see this morning. Hopefully will will find cystic duct or duct of Luschka leak.  Normal LFTs are a little bit encouraging.  I explained different scenarios with minor duct leak, major duct injury, reoperation for fluid drainage possible but hopefully not.  Doubt duodenal injury.  Type 2 diabetes without insulin    Begin SSI every 4 hours with coverage Hypertension, benign BMI 31 Umbilical hernia, primary repair Anxiety -takes Prozac  @PROBHOSP @  LOS: 0 days    Ernestene Mention 09/21/2018  . .prob

## 2018-09-21 NOTE — Progress Notes (Signed)
1 Day Post-Op  Subjective: Stable and alert.  Cousin present with him in room. Still has pain, right upper quadrant and right shoulder Afebrile.  Heart rate 96.  Respirations 20.  BP 120/62. LFTs remain normal this morning.  Glucose 275.  WBC 15,300.  Hemoglobin 10.6.  Stable.  Lipase normal.  Hepatobiliary scan positive for bile leak.  Tracer did not go into the duodenum. Dr. Dan Jacobs contacted last night to consider ERCP with stenting today.   Objective: Vital signs in last 24 hours: Temp:  [97.6 F (36.4 C)-98.3 F (36.8 C)] 98.3 F (36.8 C) (11/14 0528) Pulse Rate:  [70-96] 96 (11/14 0528) Resp:  [12-22] 20 (11/14 0528) BP: (95-134)/(54-79) 120/60 (11/14 0528) SpO2:  [92 %-100 %] 92 % (11/14 0528) Weight:  [103.9 kg] 103.9 kg (11/13 0923)    Intake/Output from previous day: 11/13 0701 - 11/14 0700 In: 4036.7 [P.O.:120; I.V.:2912.9; IV Piggyback:1003.8] Out: 525 [Urine:500; Blood:25] Intake/Output this shift: No intake/output data recorded.  General appearance: Alert.  Oriented.  Less sedated than last night.  Does not feel well but does not look toxic. Resp: clear to auscultation bilaterally GI: Soft.  Borderline distended.  Mild diffuse tenderness.  More guarding in right upper quadrant.  Not rigid.  Wounds okay  Lab Results:  Results for orders placed or performed during the hospital encounter of 09/20/18 (from the past 24 hour(s))  Glucose, capillary     Status: Abnormal   Collection Time: 09/20/18  9:07 AM  Result Value Ref Range   Glucose-Capillary 179 (H) 70 - 99 mg/dL  Glucose, capillary     Status: Abnormal   Collection Time: 09/20/18 12:08 PM  Result Value Ref Range   Glucose-Capillary 204 (H) 70 - 99 mg/dL  CBC     Status: Abnormal   Collection Time: 09/20/18  3:39 PM  Result Value Ref Range   WBC 17.8 (H) 4.0 - 10.5 K/uL   RBC 3.05 (L) 4.22 - 5.81 MIL/uL   Hemoglobin 10.3 (L) 13.0 - 17.0 g/dL   HCT 30.3 (L) 39.0 - 52.0 %   MCV 99.3 80.0 - 100.0 fL    MCH 33.8 26.0 - 34.0 pg   MCHC 34.0 30.0 - 36.0 g/dL   RDW 12.6 11.5 - 15.5 %   Platelets 227 150 - 400 K/uL   nRBC 0.0 0.0 - 0.2 %  Comprehensive metabolic panel     Status: Abnormal   Collection Time: 09/20/18  3:39 PM  Result Value Ref Range   Sodium 133 (L) 135 - 145 mmol/L   Potassium 3.9 3.5 - 5.1 mmol/L   Chloride 102 98 - 111 mmol/L   CO2 21 (L) 22 - 32 mmol/L   Glucose, Bld 301 (H) 70 - 99 mg/dL   BUN 16 8 - 23 mg/dL   Creatinine, Ser 1.12 0.61 - 1.24 mg/dL   Calcium 8.4 (L) 8.9 - 10.3 mg/dL   Total Protein 6.7 6.5 - 8.1 g/dL   Albumin 3.9 3.5 - 5.0 g/dL   AST 32 15 - 41 U/L   ALT 35 0 - 44 U/L   Alkaline Phosphatase 57 38 - 126 U/L   Total Bilirubin 0.7 0.3 - 1.2 mg/dL   GFR calc non Af Amer >60 >60 mL/min   GFR calc Af Amer >60 >60 mL/min   Anion gap 10 5 - 15  Lipase, blood     Status: None   Collection Time: 09/20/18  3:39 PM  Result Value Ref Range     Lipase 32 11 - 51 U/L  Glucose, capillary     Status: Abnormal   Collection Time: 09/20/18  9:21 PM  Result Value Ref Range   Glucose-Capillary 274 (H) 70 - 99 mg/dL   Comment 1 Notify RN    Comment 2 Document in Chart   CBC     Status: Abnormal   Collection Time: 09/20/18 10:19 PM  Result Value Ref Range   WBC 15.3 (H) 4.0 - 10.5 K/uL   RBC 3.20 (L) 4.22 - 5.81 MIL/uL   Hemoglobin 10.6 (L) 13.0 - 17.0 g/dL   HCT 31.9 (L) 39.0 - 52.0 %   MCV 99.7 80.0 - 100.0 fL   MCH 33.1 26.0 - 34.0 pg   MCHC 33.2 30.0 - 36.0 g/dL   RDW 12.5 11.5 - 15.5 %   Platelets 231 150 - 400 K/uL   nRBC 0.0 0.0 - 0.2 %  Protime-INR     Status: None   Collection Time: 09/20/18 10:19 PM  Result Value Ref Range   Prothrombin Time 13.7 11.4 - 15.2 seconds   INR 1.06   Comprehensive metabolic panel     Status: Abnormal   Collection Time: 09/21/18  3:38 AM  Result Value Ref Range   Sodium 134 (L) 135 - 145 mmol/L   Potassium 4.6 3.5 - 5.1 mmol/L   Chloride 102 98 - 111 mmol/L   CO2 24 22 - 32 mmol/L   Glucose, Bld 275 (H) 70  - 99 mg/dL   BUN 23 8 - 23 mg/dL   Creatinine, Ser 1.08 0.61 - 1.24 mg/dL   Calcium 8.4 (L) 8.9 - 10.3 mg/dL   Total Protein 6.2 (L) 6.5 - 8.1 g/dL   Albumin 3.5 3.5 - 5.0 g/dL   AST 31 15 - 41 U/L   ALT 33 0 - 44 U/L   Alkaline Phosphatase 45 38 - 126 U/L   Total Bilirubin 0.7 0.3 - 1.2 mg/dL   GFR calc non Af Amer >60 >60 mL/min   GFR calc Af Amer >60 >60 mL/min   Anion gap 8 5 - 15  Lipase, blood     Status: None   Collection Time: 09/21/18  3:38 AM  Result Value Ref Range   Lipase 30 11 - 51 U/L     Studies/Results: Nm Hepatobiliary Liver Func  Result Date: 09/20/2018 CLINICAL DATA:  Status post cholecystectomy today. Abdominal pain. Evaluate for postop bile leak. EXAM: NUCLEAR MEDICINE HEPATOBILIARY IMAGING TECHNIQUE: Sequential images of the abdomen were obtained out to 60 minutes following intravenous administration of radiopharmaceutical. RADIOPHARMACEUTICALS:  5.0 mCi Tc-99m  Choletec IV COMPARISON:  None. FINDINGS: Prompt uptake and biliary excretion of activity by the liver is seen. Gallbladder is not visualized, consistent with prior cholecystectomy. Excreted biliary activity does not enter the small bowel, but passes into area of the porta hepatis and Morison's pouch, consistent with postop bile leak. IMPRESSION: Prior cholecystectomy. Positive for postop bile leak in the right upper quadrant. These results will be called to the ordering clinician or representative by the Radiologist Assistant, and communication documented in the PACS or zVision Dashboard. Electronically Signed   By: John  Stahl M.D.   On: 09/20/2018 19:41    . acetaminophen  1,000 mg Oral TID  . aspirin EC  81 mg Oral Daily  . FLUoxetine  40 mg Oral Daily  . gabapentin  300 mg Oral BID  . glipiZIDE  2.5 mg Oral QAC breakfast  . insulin aspart  0-15 Units   Subcutaneous TID WC  . insulin aspart  0-5 Units Subcutaneous QHS  . lip balm  1 application Topical BID  . lisinopril  20 mg Oral Daily  .  metFORMIN  1,000 mg Oral BID WC  . pantoprazole  40 mg Oral BID AC  . pravastatin  80 mg Oral Daily     Assessment/Plan: s/p Procedure(s): LAPAROSCOPIC CHOLECYSTECTOMY WITH PRIMARY UMBILICAL HERNIA REPAIR  POD 1-  laparoscopic cholecystectomy and primary repair of umbilical hernia-- severe chronic inflammation but no apparent technical problems.  Unable to do cholangiogram, but otherwise case was technically uneventful. Hepatobiliary scan positive for bile leak. Clinically case went well technically and no bleeding or bile leak intraoperative. ERCP with stenting today.  Dr. Dan [facility] to see this morning. Hopefully will will find cystic duct or duct of Luschka leak.  Normal LFTs are a little bit encouraging.  I explained different scenarios with minor duct leak, major duct injury, reoperation for fluid drainage possible but hopefully not.  Doubt duodenal injury.  Type 2 diabetes without insulin    Begin SSI every 4 hours with coverage Hypertension, benign BMI 31 Umbilical hernia, primary repair Anxiety -takes Prozac  @PROBHOSP@  LOS: 0 days    Chad Cox 09/21/2018  . .prob 

## 2018-09-21 NOTE — Anesthesia Postprocedure Evaluation (Signed)
Anesthesia Post Note  Patient: Chad Cox  Procedure(s) Performed: ENDOSCOPIC RETROGRADE CHOLANGIOPANCREATOGRAPHY (ERCP) (N/A )     Patient location during evaluation: PACU Anesthesia Type: General Level of consciousness: awake and alert Pain management: pain level controlled Vital Signs Assessment: post-procedure vital signs reviewed and stable Respiratory status: spontaneous breathing, nonlabored ventilation, respiratory function stable and patient connected to nasal cannula oxygen Cardiovascular status: blood pressure returned to baseline and stable Postop Assessment: no apparent nausea or vomiting Anesthetic complications: no Comments: ERCP unsuccessful. Plan to return to OR today for abdominal washout and drain placement.     Last Vitals:  Vitals:   09/21/18 1730 09/21/18 1745  BP: 105/65 105/65  Pulse: 90 90  Resp: 18 (!) 26  Temp: 37.1 C   SpO2: 93% 92%    Last Pain:  Vitals:   09/21/18 1715  TempSrc:   PainSc: Asleep                 Shoua Ulloa L Augustine Leverette

## 2018-09-21 NOTE — Progress Notes (Signed)
I have reviewed and concur with this student's documentation.   

## 2018-09-21 NOTE — Transfer of Care (Signed)
Immediate Anesthesia Transfer of Care Note  Patient: Chad Cox  Procedure(s) Performed: Procedure(s): ENDOSCOPIC RETROGRADE CHOLANGIOPANCREATOGRAPHY (ERCP) (N/A)  Patient Location: PACU  Anesthesia Type:General  Level of Consciousness:  sedated, patient cooperative and responds to stimulation  Airway & Oxygen Therapy:Patient Spontanous Breathing and Patient connected to face mask oxgen  Post-op Assessment:  Report given to PACU RN and Post -op Vital signs reviewed and stable  Post vital signs:  Reviewed and stable  Last Vitals:  Vitals:   09/21/18 1528 09/21/18 1530  BP: 93/62 95/62  Pulse: 83 83  Resp: 15 14  Temp: 36.9 C   SpO2: 95% 93%    Complications: No apparent anesthesia complications

## 2018-09-21 NOTE — Anesthesia Procedure Notes (Signed)
Procedure Name: Intubation Date/Time: 09/21/2018 2:06 PM Performed by: Anne Fu, CRNA Pre-anesthesia Checklist: Patient identified, Emergency Drugs available, Suction available, Patient being monitored and Timeout performed Patient Re-evaluated:Patient Re-evaluated prior to induction Oxygen Delivery Method: Circle system utilized Preoxygenation: Pre-oxygenation with 100% oxygen Induction Type: IV induction, Rapid sequence and Cricoid Pressure applied Laryngoscope Size: Mac and 4 Grade View: Grade I Tube type: Oral Tube size: 7.5 mm Number of attempts: 1 Airway Equipment and Method: Stylet Placement Confirmation: ETT inserted through vocal cords under direct vision,  positive ETCO2 and breath sounds checked- equal and bilateral Secured at: 21 cm Tube secured with: Tape Dental Injury: Teeth and Oropharynx as per pre-operative assessment

## 2018-09-21 NOTE — Op Note (Signed)
[facility] Patient Name: Chad Cox Procedure Date: 09/21/2018 MRN: [id number] Attending MD: Patrick Hung , MD Date of Birth: 03/23/1951 CSN: [id number] Age: 67 Admit Type: Inpatient Procedure:                ERCP Indications:              Treatment of bile leak Providers:                Patrick Hung, MD, Karen D. Hinson, RN, Janie                            Billups, Technician, Shannon M. Blanton CRNA, CRNA Referring MD:              Medicines:                General Anesthesia Complications:            No immediate complications. Estimated Blood Loss:     Estimated blood loss: none. Procedure:                Pre-Anesthesia Assessment:                           - Prior to the procedure, a History and Physical                            was performed, and patient medications and                            allergies were reviewed. The patient's tolerance of                            previous anesthesia was also reviewed. The risks                            and benefits of the procedure and the sedation                            options and risks were discussed with the patient.                            All questions were answered, and informed consent                            was obtained. Prior Anticoagulants: The patient has                            taken no previous anticoagulant or antiplatelet                            agents. ASA Grade Assessment: II - A patient with                            mild systemic disease. After reviewing the risks                              and benefits, the patient was deemed in                            satisfactory condition to undergo the procedure.                           - Sedation was administered by an anesthesia                            professional. General anesthesia was attained.                           After obtaining informed consent, the scope was                            passed under direct  vision. Throughout the                            procedure, the patient's blood pressure, pulse, and                            oxygen saturations were monitored continuously. The                            [id number]-[id number] ([id number]) Olympus ERCP was introduced                            through the mouth, and used to inject contrast into                            without successful cannulation. The ERCP was                            extremely difficult due to abnormal anatomy. The                            patient tolerated the procedure well. Scope In: Scope Out: Findings:      The major papilla was located entirely within a diverticulum. There was       no angle that was possible with cannulating the CBD. The sphinctertome       did engage the ampulla, but the wire met resistance. Two attempts with       placing a hemoclip to evert the ampulla failed as the hemoclip did not       deploy properly. Impression:               - The major papilla was located entirely within a                            diverticulum. Moderate Sedation:      Not Applicable - Patient had care per Anesthesia. Recommendation:           - Return patient to hospital ward for ongoing care.                           -   Reattempt ERCP tomorrow. Procedure Code(s):        --- Professional ---                           [phone], Esophagogastroduodenoscopy, flexible,                            transoral; diagnostic, including collection of                            specimen(s) by brushing or washing, when performed                            (separate procedure) Diagnosis Code(s):        --- Professional ---                           K83.8, Other specified diseases of biliary tract CPT copyright 2018 [employer]. All rights reserved. The codes documented in this report are preliminary and upon coder review may  be revised to meet current compliance requirements. Carol Ada, MD Carol Ada,  MD 09/21/2018 3:51:07 PM This report has been signed electronically. Number of Addenda: 0

## 2018-09-21 NOTE — Interval H&P Note (Signed)
History and Physical Interval Note:  09/21/2018 1:21 PM  Chad Cox  has presented today for surgery, with the diagnosis of Bile leak  The various methods of treatment have been discussed with the patient and family. After consideration of risks, benefits and other options for treatment, the patient has consented to  Procedure(s): ENDOSCOPIC RETROGRADE CHOLANGIOPANCREATOGRAPHY (ERCP) (N/A) as a surgical intervention .  The patient's history has been reviewed, patient examined, no change in status, stable for surgery.  I have reviewed the patient's chart and labs.  Questions were answered to the patient's satisfaction.     [name] D

## 2018-09-21 NOTE — Anesthesia Procedure Notes (Signed)
Arterial Line Insertion Start/End11/14/2019 7:35 PM, 09/21/2018 7:45 PM Performed by: Elmer PickerWoodrum, Liara Holm L, MD, anesthesiologist  Patient location: Pre-op. Preanesthetic checklist: patient identified, IV checked, site marked, risks and benefits discussed, surgical consent, monitors and equipment checked, pre-op evaluation, timeout performed and anesthesia consent Lidocaine 1% used for infiltration radial was placed Catheter size: 20 Fr Hand hygiene performed  and maximum sterile barriers used   Attempts: 1 Procedure performed without using ultrasound guided technique. Following insertion, dressing applied. Post procedure assessment: normal and unchanged  Patient tolerated the procedure well with no immediate complications.

## 2018-09-21 NOTE — Consult Note (Signed)
Reason for Consult: Bile Leak Referring Physician: CCS  Gabriel Rainwater HPI: This is a 67 year old male s/p lap chole for symptomatic gallstones.  The procedure was performed without overt complications, but shortly after the surgery the patient started to complain of severe RUQ pain with radiation to his shoulders.  A HIDA scan confirmed a bile leak.  Past Medical History:  Diagnosis Date  . Cholecystitis   . Diabetes mellitus without complication (HCC)   . Gallstones and inflammation of gallbladder without obstruction 09/20/2018  . Hypertension   . Kidney stone     Past Surgical History:  Procedure Laterality Date  . CHOLECYSTECTOMY N/A 09/20/2018   Procedure: LAPAROSCOPIC CHOLECYSTECTOMY WITH PRIMARY UMBILICAL HERNIA REPAIR;  Surgeon: Claud Kelp, MD;  Location: [facility];  Service: General;  Laterality: N/A;  . WISDOM TOOTH EXTRACTION      Family History  Problem Relation Age of Onset  . Atrial fibrillation Mother     Social History:  reports that he has never smoked. He has never used smokeless tobacco. He reports that he drinks alcohol. He reports that he does not use drugs.  Allergies:  Allergies  Allergen Reactions  . Adhesive [Tape] Other (See Comments)    "acts like poison ivy" blisters up, itching  . Betadine [Povidone Iodine] Other (See Comments)    Blisters, itching    Medications:  Scheduled: . [MAR Hold] acetaminophen  1,000 mg Oral TID  . [MAR Hold] aspirin EC  81 mg Oral Daily  . [MAR Hold] FLUoxetine  40 mg Oral Daily  . [MAR Hold] gabapentin  300 mg Oral BID  . [MAR Hold] glipiZIDE  2.5 mg Oral QAC breakfast  . [MAR Hold] insulin aspart  0-15 Units Subcutaneous Q4H  . [MAR Hold] insulin glargine  10 Units Subcutaneous QHS  . [MAR Hold] lip balm  1 application Topical BID  . [MAR Hold] lisinopril  20 mg Oral Daily  . [MAR Hold] metFORMIN  1,000 mg Oral BID WC  . [MAR Hold] pantoprazole  40 mg Oral BID AC  . [MAR Hold] pravastatin  80 mg Oral Daily   . sodium chloride (PF)       Continuous: . [MAR Hold] sodium chloride Stopped (09/21/18 0527)  . sodium chloride    . lactated ringers 75 mL/hr at 09/21/18 0823  . [MAR Hold] piperacillin-tazobactam (ZOSYN)  IV 12.5 mL/hr at 09/21/18 0631  . [MAR Hold] sodium chloride      Results for orders placed or performed during the hospital encounter of 09/20/18 (from the past 24 hour(s))  CBC     Status: Abnormal   Collection Time: 09/20/18  3:39 PM  Result Value Ref Range   WBC 17.8 (H) 4.0 - 10.5 K/uL   RBC 3.05 (L) 4.22 - 5.81 MIL/uL   Hemoglobin 10.3 (L) 13.0 - 17.0 g/dL   HCT 69.6 (L) 29.5 - 28.4 %   MCV 99.3 80.0 - 100.0 fL   MCH 33.8 26.0 - 34.0 pg   MCHC 34.0 30.0 - 36.0 g/dL   RDW 13.2 44.0 - 10.2 %   Platelets 227 150 - 400 K/uL   nRBC 0.0 0.0 - 0.2 %  Comprehensive metabolic panel     Status: Abnormal   Collection Time: 09/20/18  3:39 PM  Result Value Ref Range   Sodium 133 (L) 135 - 145 mmol/L   Potassium 3.9 3.5 - 5.1 mmol/L   Chloride 102 98 - 111 mmol/L   CO2 21 (L) 22 -  32 mmol/L   Glucose, Bld 301 (H) 70 - 99 mg/dL   BUN 16 8 - 23 mg/dL   Creatinine, Ser 1.61 0.61 - 1.24 mg/dL   Calcium 8.4 (L) 8.9 - 10.3 mg/dL   Total Protein 6.7 6.5 - 8.1 g/dL   Albumin 3.9 3.5 - 5.0 g/dL   AST 32 15 - 41 U/L   ALT 35 0 - 44 U/L   Alkaline Phosphatase 57 38 - 126 U/L   Total Bilirubin 0.7 0.3 - 1.2 mg/dL   GFR calc non Af Amer >60 >60 mL/min   GFR calc Af Amer >60 >60 mL/min   Anion gap 10 5 - 15  Lipase, blood     Status: None   Collection Time: 09/20/18  3:39 PM  Result Value Ref Range   Lipase 32 11 - 51 U/L  Glucose, capillary     Status: Abnormal   Collection Time: 09/20/18  9:21 PM  Result Value Ref Range   Glucose-Capillary 274 (H) 70 - 99 mg/dL   Comment 1 Notify RN    Comment 2 Document in Chart   CBC     Status: Abnormal   Collection Time: 09/20/18 10:19 PM  Result Value Ref Range   WBC 15.3 (H) 4.0 - 10.5 K/uL   RBC 3.20 (L) 4.22 - 5.81 MIL/uL    Hemoglobin 10.6 (L) 13.0 - 17.0 g/dL   HCT 09.6 (L) 04.5 - 40.9 %   MCV 99.7 80.0 - 100.0 fL   MCH 33.1 26.0 - 34.0 pg   MCHC 33.2 30.0 - 36.0 g/dL   RDW 81.1 91.4 - 78.2 %   Platelets 231 150 - 400 K/uL   nRBC 0.0 0.0 - 0.2 %  Protime-INR     Status: None   Collection Time: 09/20/18 10:19 PM  Result Value Ref Range   Prothrombin Time 13.7 11.4 - 15.2 seconds   INR 1.06   Comprehensive metabolic panel     Status: Abnormal   Collection Time: 09/21/18  3:38 AM  Result Value Ref Range   Sodium 134 (L) 135 - 145 mmol/L   Potassium 4.6 3.5 - 5.1 mmol/L   Chloride 102 98 - 111 mmol/L   CO2 24 22 - 32 mmol/L   Glucose, Bld 275 (H) 70 - 99 mg/dL   BUN 23 8 - 23 mg/dL   Creatinine, Ser 9.56 0.61 - 1.24 mg/dL   Calcium 8.4 (L) 8.9 - 10.3 mg/dL   Total Protein 6.2 (L) 6.5 - 8.1 g/dL   Albumin 3.5 3.5 - 5.0 g/dL   AST 31 15 - 41 U/L   ALT 33 0 - 44 U/L   Alkaline Phosphatase 45 38 - 126 U/L   Total Bilirubin 0.7 0.3 - 1.2 mg/dL   GFR calc non Af Amer >60 >60 mL/min   GFR calc Af Amer >60 >60 mL/min   Anion gap 8 5 - 15  Lipase, blood     Status: None   Collection Time: 09/21/18  3:38 AM  Result Value Ref Range   Lipase 30 11 - 51 U/L  Glucose, capillary     Status: Abnormal   Collection Time: 09/21/18  7:47 AM  Result Value Ref Range   Glucose-Capillary 209 (H) 70 - 99 mg/dL  Glucose, capillary     Status: Abnormal   Collection Time: 09/21/18 11:43 AM  Result Value Ref Range   Glucose-Capillary 186 (H) 70 - 99 mg/dL     Nm Hepatobiliary Liver  Func  Result Date: 09/20/2018 CLINICAL DATA:  Status post cholecystectomy today. Abdominal pain. Evaluate for postop bile leak. EXAM: NUCLEAR MEDICINE HEPATOBILIARY IMAGING TECHNIQUE: Sequential images of the abdomen were obtained out to 60 minutes following intravenous administration of radiopharmaceutical. RADIOPHARMACEUTICALS:  5.0 mCi Tc-68m  Choletec IV COMPARISON:  None. FINDINGS: Prompt uptake and biliary excretion of activity by the  liver is seen. Gallbladder is not visualized, consistent with prior cholecystectomy. Excreted biliary activity does not enter the small bowel, but passes into area of the porta hepatis and Morison's pouch, consistent with postop bile leak. IMPRESSION: Prior cholecystectomy. Positive for postop bile leak in the right upper quadrant. These results will be called to the ordering clinician or representative by the Radiologist Assistant, and communication documented in the PACS or zVision Dashboard. Electronically Signed   By: Myles Rosenthal M.D.   On: 09/20/2018 19:41   Ct Abdomen Pelvis W Contrast  Result Date: 09/21/2018 CLINICAL DATA:  Recent cholecystectomy, now with concern for biliary leak. Please perform CT scan to evaluate for biloma. EXAM: CT ABDOMEN AND PELVIS WITH CONTRAST TECHNIQUE: Multidetector CT imaging of the abdomen and pelvis was performed using the standard protocol following bolus administration of intravenous contrast. CONTRAST:  OMNIPAQUE IOHEXOL 300 MG/ML  SOLN COMPARISON:  CT abdomen pelvis-08/27/2018; nuclear medicine HIDA scan-10/07/2018 FINDINGS: Lower chest: Limited visualization of the lower thorax demonstrates bibasilar consolidative opacities with associated air bronchograms, right greater than left. No definite pleural effusion. Borderline cardiomegaly.  No pericardial effusion. Hepatobiliary: Normal hepatic contour. There is diffuse decreased attenuation hepatic parenchyma suggestive of hepatic steatosis. No discrete hepatic lesions. Interval cholecystectomy with small amount of fluid seen within the gallbladder fossa, about the liver edge and tracking along the right pericolic gutter to the level of the pelvic cul-de-sac without definable/drainable fluid collection or biloma. Common bile duct appears nondilated measuring approximately 0.6 cm in diameter (coronal image 45, series 5). No intrahepatic biliary duct dilatation. Pancreas: Normal appearance of the pancreas Spleen:  Normal appearance of the spleen Adrenals/Urinary Tract: There is symmetric enhancement and excretion of the bilateral kidneys. Note is made of a punctate (approximately 0.4 cm) nonobstructing stone within the interpolar aspect of the right kidney (coronal image 69, series 5). Note is made of a punctate (sub 5 mm) hypoattenuating lesion within the interpolar aspect the left kidney (coronal image 69, series 5), too small to accurately characterize of favored to represent a renal cyst. No discrete right-sided renal lesions. No urinary obstruction or perinephric stranding. Normal appearance the bilateral adrenal glands. Normal appearance of the urinary bladder given degree distention. Stomach/Bowel: Rather extensive colonic diverticulosis, primarily involving the descending and sigmoid colon, without evidence of superimposed acute diverticulitis. The bowel is otherwise normal in course and caliber without wall thickening or evidence of enteric obstruction. Normal appearance of the terminal ileum. The appendix is not visualized, however there is no pericecal inflammatory change. Note is made of a small duodenal diverticulum. There is a trace amount of pneumoperitoneum about the liver edge likely the sequela of recent cholecystectomy state. No pneumatosis or portal venous gas. Vascular/Lymphatic: Minimal amount of mixed calcified and noncalcified atherosclerotic plaque with a normal caliber abdominal aorta, not resulting in hemodynamically significant stenosis. No bulky retroperitoneal, mesenteric, pelvic or inguinal lymphadenopathy. Reproductive: Normal appearance the prostate gland. There is a small amount of fluid seen with the pelvic cul-de-sac. Other: There is a trace amount of subcutaneous emphysema about the midline of the abdomen (representative axial image 24, series 2), likely the sequela  of recent cholecystectomy state. Ill-defined stranding about the umbilicus, likely location laparoscopy port.  Musculoskeletal: No acute or aggressive osseous abnormalities. Moderate to severe multilevel lumbar spine DDD, likely worse at L2-L3, L3-L4 and L5-S1 with disc space height loss, endplate irregularity small posteriorly directed disc osteophyte complexes at these locations. IMPRESSION: 1. Post cholecystectomy with trace amount of fluid within the gallbladder fossa, about the liver edge and tracking along the right pericolic gutter to the level of the pelvic cul-de-sac without definable/drainable biloma. 2. Worsening bibasilar opacities within the imaged lung bases, likely atelectasis. 3. Rather extensive colonic diverticulitis without evidence of superimposed acute diverticulitis. 4. Suspected hepatic steatosis.  Correlation with LFTs is advised. 5. Solitary punctate (approximately 4 mm) nonobstructing right-sided renal stone. 6.  Aortic Atherosclerosis (ICD10-I70.0). Above findings discussed with Dr. Elnoria HowardHung at the time of examination completion. Electronically Signed   By: Simonne ComeJohn  Watts M.D.   On: 09/21/2018 11:12    ROS:  As stated above in the HPI otherwise negative.  Blood pressure (!) 108/57, pulse 83, temperature 99.5 F (37.5 C), temperature source Oral, resp. rate 20, height 5\' 10"  (1.778 m), weight 103.9 kg, SpO2 93 %.    PE: Gen: NAD, but uncomfortable, Alert and Oriented HEENT:  [place]/AT, EOMI Neck: Supple, no LAD Lungs: CTA Bilaterally CV: RRR without M/G/R ABM: Soft, NTND, +BS Ext: No C/C/E  Assessment/Plan: 1) Bile leak - ERCP with stent placement.  Felicidad Sugarman D 09/21/2018, 1:21 PM

## 2018-09-21 NOTE — Anesthesia Procedure Notes (Addendum)
Procedure Name: Intubation Date/Time: 09/21/2018 7:30 PM Performed by: Illene SilverEvans, Joeangel Jeanpaul E, CRNA Pre-anesthesia Checklist: Patient identified, Emergency Drugs available, Suction available and Patient being monitored Patient Re-evaluated:Patient Re-evaluated prior to induction Oxygen Delivery Method: Circle system utilized Preoxygenation: Pre-oxygenation with 100% oxygen Induction Type: IV induction, Rapid sequence and Cricoid Pressure applied Ventilation: Mask ventilation without difficulty Laryngoscope Size: Glidescope and 4 Grade View: Grade II Tube type: Oral Tube size: 8.0 (subglottic ETT) mm Number of attempts: 1 Airway Equipment and Method: Stylet and Oral airway Placement Confirmation: ETT inserted through vocal cords under direct vision,  positive ETCO2 and breath sounds checked- equal and bilateral Secured at: 21 cm Tube secured with: Tape Dental Injury: Teeth and Oropharynx as per pre-operative assessment  Difficulty Due To: Difficulty was anticipated, Difficult Airway- due to limited oral opening and Difficult Airway- due to large tongue

## 2018-09-21 NOTE — Anesthesia Preprocedure Evaluation (Addendum)
Anesthesia Evaluation  Patient identified by MRN, date of birth, ID band Patient awake    Reviewed: Allergy & Precautions, NPO status , Patient's Chart, lab work & pertinent test results  Airway Mallampati: II  TM Distance: >3 FB Neck ROM: Full    Dental no notable dental hx. (+) Teeth Intact, Dental Advisory Given   Pulmonary neg pulmonary ROS,    Pulmonary exam normal breath sounds clear to auscultation       Cardiovascular hypertension, Pt. on medications negative cardio ROS Normal cardiovascular exam Rhythm:Regular Rate:Normal     Neuro/Psych negative neurological ROS  negative psych ROS   GI/Hepatic negative GI ROS, Neg liver ROS,   Endo/Other  negative endocrine ROSdiabetes, Type 2, Insulin Dependent, Oral Hypoglycemic Agents  Renal/GU negative Renal ROS  negative genitourinary   Musculoskeletal negative musculoskeletal ROS (+)   Abdominal   Peds  Hematology negative hematology ROS (+)   Anesthesia Other Findings POD1 lap chole now with bile leak  Reproductive/Obstetrics                            Anesthesia Physical Anesthesia Plan  ASA: III  Anesthesia Plan: General   Post-op Pain Management:    Induction: Intravenous, Rapid sequence and Cricoid pressure planned  PONV Risk Score and Plan: 2 and Treatment may vary due to age or medical condition and Ondansetron  Airway Management Planned: Oral ETT  Additional Equipment:   Intra-op Plan:   Post-operative Plan: Extubation in OR  Informed Consent: I have reviewed the patients History and Physical, chart, labs and discussed the procedure including the risks, benefits and alternatives for the proposed anesthesia with the patient or authorized representative who has indicated his/her understanding and acceptance.   Dental advisory given  Plan Discussed with: CRNA  Anesthesia Plan Comments:         Anesthesia Quick  Evaluation

## 2018-09-21 NOTE — Progress Notes (Signed)
We were called in consult for this patient for his bile leak.  Upon further chart review patient does belong to Dr. Loreta AveMann.  Dr. Elnoria HowardHung is on call for [facility] and was notified and will be seeing the patient later today.  Plans are still for IR intervention with likely biliary drain today and hopefully ERCP later today and/or tomorrow morning.  Patient/family was notified. Please await further recommendations from Dr. Elnoria HowardHung.  Thank you, Hyacinth MeekerJennifer Lemmon, PA-C [facility]

## 2018-09-21 NOTE — Progress Notes (Signed)
Transparent dressing

## 2018-09-21 NOTE — Transfer of Care (Signed)
Immediate Anesthesia Transfer of Care Note  Patient: Chad Cox  Procedure(s) Performed: LAPAROSCOPY DIAGNOSTIC, WASHOUT, DRAIN PLACEMENT (N/A )  Patient Location: PACU  Anesthesia Type:General  Level of Consciousness: awake, alert , oriented and patient cooperative  Airway & Oxygen Therapy: Patient Spontanous Breathing and Patient connected to face mask oxygen  Post-op Assessment: Report given to RN, Post -op Vital signs reviewed and stable and Patient moving all extremities X 4  Post vital signs: stable  Last Vitals:  Vitals Value Taken Time  BP 107/60 09/21/2018  8:45 PM  Temp    Pulse 92 09/21/2018  8:53 PM  Resp 13 09/21/2018  8:53 PM  SpO2 94 % 09/21/2018  8:53 PM  Vitals shown include unvalidated device data.  Last Pain:  Vitals:   09/21/18 1830  TempSrc:   PainSc: Asleep      Patients Stated Pain Goal: 3 (09/21/18 0900)  Complications: No apparent anesthesia complications

## 2018-09-21 NOTE — Op Note (Signed)
09/20/2018 - 09/21/2018  8:07 PM  PATIENT:  Chad Cox  67 y.o. male  Patient Care Team: Copland, Gwenlyn FoundJessica C, MD as PCP - General (Family Medicine)  PRE-OPERATIVE DIAGNOSIS:  bile leak  POST-OPERATIVE DIAGNOSIS:  bile leak  PROCEDURE:  LAPAROSCOPY DIAGNOSTIC, ABDOMINAL WASHOUT, DRAIN PLACEMENT   Surgeon(s): Romie Leveehomas, Tamee Battin, MD Abigail MiyamotoBlackman, Douglas, MD  ASSISTANT: Dr Magnus IvanBlackman  ANESTHESIA:   general  EBL:  No intake/output data recorded.  DRAINS: (60F) Jackson-Pratt drain(s) with closed bulb suction in the RUQ   SPECIMEN:  No Specimen  DISPOSITION OF SPECIMEN:  N/A  COUNTS:  YES  PLAN OF CARE: Pt admitted  PATIENT DISPOSITION:  PACU - hemodynamically stable.  INDICATION: 67 year old male who underwent lap scopic cholecystectomy yesterday by my partner Dr. Derrell LollingIngram.  He began having pain in the PACU.  HIDA scan and CT scan were obtained today which showed a bile leak and biloma.  ERCP was attempted but they were unable to cannulate the duct.  Patient with mild tachycardia and tachypnea.  It was decided to go back to the operating room for washout and drain placement.  This was discussed with the patient's next of kin and consent was signed and placed on chart.  Risks such as bleeding, infection, abscess, leak, injury to other organs, need for further treatment, heart attack, death, and other risks were discussed.  I noted a good likelihood this will help address the problem.  Possibility that this will not correct all abdominal symptoms was explained.  Goals of post-operative recovery were discussed as well.    OR FINDINGS: Bile leak that appeared to be arising from area posterior to the cystic duct stump  DESCRIPTION:   The patient was identified & brought into the operating room. The patient was positioned supine with arms tucked. SCDs were active during the entire case. The patient underwent general anesthesia without any difficulty.  The abdomen was prepped and draped in  a sterile fashion. A Surgical Timeout was performed and confirmed our plan.  We positioned the patient in reverse [place] & left side up.  I used a varies needle to enter into the abdomen and obtain insufflation.  Once insufflated to approximately 15 mmHg, a 5 mm port was placed at the right upper quadrant midline to avoid the previous hernia repair.  Entry was clean. There were no adhesions to the anterior abdominal wall supraumbilically.  Camera inspection revealed no injury.    I proceeded to continue with laparoscopic technique. I placed a 5 mm port in mid subcostal region, another 5mm port in the right flank near the anterior axillary line, and a 5mm port in the left subxiphoid region obliquely within the falciform ligament.  The patient was then placed in right side up position.  I turned attention to the right upper quadrant.  There was a large amount of bile within the right upper quadrant.  This was suctioned away and irrigated.  The liver was gently retracted upwards.  The cystic duct and cystic artery clips appeared to be in place.  There is no obvious leak coming from the liver bed.  Upon further inspection there was an area posterior to the cystic duct that appeared to be collecting bile.  One small tubular structure was identified and clipped with a 5 mm clip.  It was unclear to me or Dr. Magnus IvanBlackman if this was the cause of the leak.  We placed a 1819 [place] Blake drain in the right upper quadrant and under the liver.  This was brought out through the lateral right upper quadrant incision.  It was secured into place with a 2-0 nylon suture.  The abdomen was then desufflated.  The ports were removed.  I closed the skin using 4-0 vicryl stitch.  Sterile dressings were applied. The patient was extubated & arrived in the PACU in stable condition.  I had discussed postoperative care with the patient in the holding area.   I will discuss  operative findings and postoperative goals / instructions  with the patient's family.  Instructions are written in the chart as well.

## 2018-09-22 ENCOUNTER — Encounter (HOSPITAL_COMMUNITY): Payer: Self-pay | Admitting: General Surgery

## 2018-09-22 ENCOUNTER — Ambulatory Visit (HOSPITAL_COMMUNITY): Payer: BLUE CROSS/BLUE SHIELD

## 2018-09-22 ENCOUNTER — Other Ambulatory Visit: Payer: Self-pay | Admitting: Gastroenterology

## 2018-09-22 DIAGNOSIS — F419 Anxiety disorder, unspecified: Secondary | ICD-10-CM | POA: Diagnosis present

## 2018-09-22 DIAGNOSIS — Z6831 Body mass index (BMI) 31.0-31.9, adult: Secondary | ICD-10-CM | POA: Diagnosis not present

## 2018-09-22 DIAGNOSIS — K420 Umbilical hernia with obstruction, without gangrene: Secondary | ICD-10-CM | POA: Diagnosis present

## 2018-09-22 DIAGNOSIS — K659 Peritonitis, unspecified: Secondary | ICD-10-CM | POA: Diagnosis not present

## 2018-09-22 DIAGNOSIS — F329 Major depressive disorder, single episode, unspecified: Secondary | ICD-10-CM | POA: Diagnosis present

## 2018-09-22 DIAGNOSIS — J690 Pneumonitis due to inhalation of food and vomit: Secondary | ICD-10-CM | POA: Diagnosis not present

## 2018-09-22 DIAGNOSIS — R933 Abnormal findings on diagnostic imaging of other parts of digestive tract: Secondary | ICD-10-CM | POA: Diagnosis not present

## 2018-09-22 DIAGNOSIS — Z91048 Other nonmedicinal substance allergy status: Secondary | ICD-10-CM | POA: Diagnosis not present

## 2018-09-22 DIAGNOSIS — R1011 Right upper quadrant pain: Secondary | ICD-10-CM | POA: Diagnosis not present

## 2018-09-22 DIAGNOSIS — Z7982 Long term (current) use of aspirin: Secondary | ICD-10-CM | POA: Diagnosis not present

## 2018-09-22 DIAGNOSIS — Z7984 Long term (current) use of oral hypoglycemic drugs: Secondary | ICD-10-CM | POA: Diagnosis not present

## 2018-09-22 DIAGNOSIS — K915 Postcholecystectomy syndrome: Secondary | ICD-10-CM | POA: Diagnosis not present

## 2018-09-22 DIAGNOSIS — J90 Pleural effusion, not elsewhere classified: Secondary | ICD-10-CM | POA: Diagnosis not present

## 2018-09-22 DIAGNOSIS — K8010 Calculus of gallbladder with chronic cholecystitis without obstruction: Secondary | ICD-10-CM | POA: Diagnosis present

## 2018-09-22 DIAGNOSIS — K219 Gastro-esophageal reflux disease without esophagitis: Secondary | ICD-10-CM | POA: Diagnosis present

## 2018-09-22 DIAGNOSIS — K567 Ileus, unspecified: Secondary | ICD-10-CM | POA: Diagnosis not present

## 2018-09-22 DIAGNOSIS — R0602 Shortness of breath: Secondary | ICD-10-CM | POA: Diagnosis not present

## 2018-09-22 DIAGNOSIS — Y838 Other surgical procedures as the cause of abnormal reaction of the patient, or of later complication, without mention of misadventure at the time of the procedure: Secondary | ICD-10-CM | POA: Diagnosis present

## 2018-09-22 DIAGNOSIS — E119 Type 2 diabetes mellitus without complications: Secondary | ICD-10-CM | POA: Diagnosis present

## 2018-09-22 DIAGNOSIS — Z883 Allergy status to other anti-infective agents status: Secondary | ICD-10-CM | POA: Diagnosis not present

## 2018-09-22 DIAGNOSIS — K832 Perforation of bile duct: Secondary | ICD-10-CM | POA: Diagnosis not present

## 2018-09-22 DIAGNOSIS — Z79899 Other long term (current) drug therapy: Secondary | ICD-10-CM | POA: Diagnosis not present

## 2018-09-22 DIAGNOSIS — K82A1 Gangrene of gallbladder in cholecystitis: Secondary | ICD-10-CM | POA: Diagnosis present

## 2018-09-22 DIAGNOSIS — A419 Sepsis, unspecified organism: Secondary | ICD-10-CM | POA: Diagnosis not present

## 2018-09-22 DIAGNOSIS — K9189 Other postprocedural complications and disorders of digestive system: Secondary | ICD-10-CM | POA: Diagnosis not present

## 2018-09-22 DIAGNOSIS — K660 Peritoneal adhesions (postprocedural) (postinfection): Secondary | ICD-10-CM | POA: Diagnosis present

## 2018-09-22 DIAGNOSIS — K8000 Calculus of gallbladder with acute cholecystitis without obstruction: Secondary | ICD-10-CM | POA: Diagnosis not present

## 2018-09-22 DIAGNOSIS — I10 Essential (primary) hypertension: Secondary | ICD-10-CM | POA: Diagnosis present

## 2018-09-22 DIAGNOSIS — K5710 Diverticulosis of small intestine without perforation or abscess without bleeding: Secondary | ICD-10-CM | POA: Diagnosis present

## 2018-09-22 LAB — GLUCOSE, CAPILLARY
Glucose-Capillary: 112 mg/dL — ABNORMAL HIGH (ref 70–99)
Glucose-Capillary: 116 mg/dL — ABNORMAL HIGH (ref 70–99)
Glucose-Capillary: 121 mg/dL — ABNORMAL HIGH (ref 70–99)
Glucose-Capillary: 134 mg/dL — ABNORMAL HIGH (ref 70–99)
Glucose-Capillary: 135 mg/dL — ABNORMAL HIGH (ref 70–99)
Glucose-Capillary: 151 mg/dL — ABNORMAL HIGH (ref 70–99)
Glucose-Capillary: 187 mg/dL — ABNORMAL HIGH (ref 70–99)
Glucose-Capillary: 190 mg/dL — ABNORMAL HIGH (ref 70–99)
Glucose-Capillary: 88 mg/dL (ref 70–99)

## 2018-09-22 LAB — COMPREHENSIVE METABOLIC PANEL
ALT: 24 U/L (ref 0–44)
AST: 21 U/L (ref 15–41)
Albumin: 2.8 g/dL — ABNORMAL LOW (ref 3.5–5.0)
Alkaline Phosphatase: 37 U/L — ABNORMAL LOW (ref 38–126)
Anion gap: 6 (ref 5–15)
BUN: 15 mg/dL (ref 8–23)
CO2: 25 mmol/L (ref 22–32)
Calcium: 7.8 mg/dL — ABNORMAL LOW (ref 8.9–10.3)
Chloride: 103 mmol/L (ref 98–111)
Creatinine, Ser: 0.91 mg/dL (ref 0.61–1.24)
GFR calc Af Amer: >60 mL/min (ref >60)
GFR calc non Af Amer: >60 mL/min (ref >60)
Glucose, Bld: 173 mg/dL — ABNORMAL HIGH (ref 70–99)
Potassium: 4 mmol/L (ref 3.5–5.1)
Sodium: 134 mmol/L — ABNORMAL LOW (ref 135–145)
Total Bilirubin: 0.8 mg/dL (ref 0.3–1.2)
Total Protein: 5.3 g/dL — ABNORMAL LOW (ref 6.5–8.1)

## 2018-09-22 LAB — CBC
HCT: 27 % — ABNORMAL LOW (ref 39.0–52.0)
Hemoglobin: 9 g/dL — ABNORMAL LOW (ref 13.0–17.0)
MCH: 34 pg (ref 26.0–34.0)
MCHC: 33.3 g/dL (ref 30.0–36.0)
MCV: 101.9 fL — ABNORMAL HIGH (ref 80.0–100.0)
Platelets: 173 10*3/uL (ref 150–400)
RBC: 2.65 MIL/uL — ABNORMAL LOW (ref 4.22–5.81)
RDW: 13.2 % (ref 11.5–15.5)
WBC: 16.5 10*3/uL — ABNORMAL HIGH (ref 4.0–10.5)
nRBC: 0 % (ref 0.0–0.2)

## 2018-09-22 LAB — LIPASE, BLOOD: Lipase: 24 U/L (ref 11–51)

## 2018-09-22 NOTE — Anesthesia Postprocedure Evaluation (Signed)
Anesthesia Post Note  Patient: Gabriel Rainwaterimothy Skoda  Procedure(s) Performed: LAPAROSCOPY DIAGNOSTIC, WASHOUT, DRAIN PLACEMENT (N/A )     Patient location during evaluation: PACU Anesthesia Type: General Level of consciousness: awake and alert Pain management: pain level controlled Vital Signs Assessment: post-procedure vital signs reviewed and stable Respiratory status: spontaneous breathing, nonlabored ventilation, respiratory function stable and patient connected to nasal cannula oxygen Cardiovascular status: blood pressure returned to baseline and stable Postop Assessment: no apparent nausea or vomiting Anesthetic complications: no    Last Vitals:  Vitals:   09/22/18 0800 09/22/18 0852  BP: (!) 94/48   Pulse: 82 84  Resp: (!) 22 (!) 29  Temp: 36.9 C   SpO2: 94% 95%    Last Pain:  Vitals:   09/22/18 0852  TempSrc:   PainSc: 5                  Shanice Poznanski L Latha Staunton

## 2018-09-22 NOTE — Plan of Care (Signed)
°  Problem: Coping: °Goal: Level of anxiety will decrease °Outcome: Progressing °  °

## 2018-09-22 NOTE — Progress Notes (Signed)
1 Day Post-Op  Subjective: Awake and alert.  Less distress, less pain, less toxic appearing  Yesterday, ERCP unsuccessful due to large duodenal  diverticulum.  Therefore bile leak and peritonitis continued and was progressing Return to OR last night by Dr. Maisie Fus who washed bile out and thought clips on cystic duct were intact but perhaps a small tubular structure adjacent to this was leaking and one clip was placed.  No evidence of duodenal injury.  BP 104/44.  Heart rate 89.  Temp 99.7.  SPO2 94% on facemask.  Mouth breather. Good urine output JP drain 350 cc out.  Slightly bile tinged but mostly serosanguineous  Hemoglobin 9.0.  WBC 16,500.  Liver function test normal.  Creatinine 0.91.  BUN 15.  Glucose 173.  Objective: Vital signs in last 24 hours: Temp:  [98.5 F (36.9 C)-99.7 F (37.6 C)] 99.7 F (37.6 C) (11/15 0400) Pulse Rate:  [80-103] 89 (11/15 0400) Resp:  [0-33] 27 (11/15 0400) BP: (89-131)/(44-94) 104/44 (11/15 0400) SpO2:  [86 %-100 %] 94 % (11/15 0400) Arterial Line BP: (99-141)/(30-59) 115/44 (11/15 0400) Weight:  [103.9 kg-107.2 kg] 107.2 kg (11/14 2130) Last BM Date: 09/19/18  Intake/Output from previous day: 11/14 0701 - 11/15 0700 In: 5532.2 [I.V.:5317.2; IV Piggyback:215] Out: 1935 [Urine:1535; Drains:350; Blood:50] Intake/Output this shift: Total I/O In: 2769.7 [I.V.:2717.2; IV Piggyback:52.5] Out: 1585 [Urine:1185; Drains:350; Blood:50]   EXAM: General appearance: Alert.  Seems oriented.  Carries on reasonable conversation.  Fatigued and a bit deconditioned now. Resp: Decreased breath sounds at bases but no rhonchi or wheeze GI: Soft.  Hypoactive bowel sounds.  Some diffuse tenderness.  All wounds look good.  JP serosanguineous but I suspect a little bile tinged.  Wonder if this is more irrigation fluid than anything Incision/Wound: clean without drainage.   Lab Results:  Results for orders placed or performed during the hospital encounter of  09/20/18 (from the past 24 hour(s))  Glucose, capillary     Status: Abnormal   Collection Time: 09/21/18  7:47 AM  Result Value Ref Range   Glucose-Capillary 209 (H) 70 - 99 mg/dL  Glucose, capillary     Status: Abnormal   Collection Time: 09/21/18 11:43 AM  Result Value Ref Range   Glucose-Capillary 186 (H) 70 - 99 mg/dL  Glucose, capillary     Status: Abnormal   Collection Time: 09/21/18  3:41 PM  Result Value Ref Range   Glucose-Capillary 188 (H) 70 - 99 mg/dL  MRSA PCR Screening     Status: None   Collection Time: 09/21/18  9:59 PM  Result Value Ref Range   MRSA by PCR NEGATIVE NEGATIVE  Glucose, capillary     Status: Abnormal   Collection Time: 09/22/18 12:42 AM  Result Value Ref Range   Glucose-Capillary 187 (H) 70 - 99 mg/dL   Comment 1 Notify RN    Comment 2 Document in Chart   CBC     Status: Abnormal   Collection Time: 09/22/18 12:59 AM  Result Value Ref Range   WBC 16.5 (H) 4.0 - 10.5 K/uL   RBC 2.65 (L) 4.22 - 5.81 MIL/uL   Hemoglobin 9.0 (L) 13.0 - 17.0 g/dL   HCT 16.1 (L) 09.6 - 04.5 %   MCV 101.9 (H) 80.0 - 100.0 fL   MCH 34.0 26.0 - 34.0 pg   MCHC 33.3 30.0 - 36.0 g/dL   RDW 40.9 81.1 - 91.4 %   Platelets 173 150 - 400 K/uL   nRBC 0.0 0.0 -  0.2 %  Glucose, capillary     Status: Abnormal   Collection Time: 09/22/18  3:21 AM  Result Value Ref Range   Glucose-Capillary 151 (H) 70 - 99 mg/dL  Comprehensive metabolic panel     Status: Abnormal   Collection Time: 09/22/18  4:19 AM  Result Value Ref Range   Sodium 134 (L) 135 - 145 mmol/L   Potassium 4.0 3.5 - 5.1 mmol/L   Chloride 103 98 - 111 mmol/L   CO2 25 22 - 32 mmol/L   Glucose, Bld 173 (H) 70 - 99 mg/dL   BUN 15 8 - 23 mg/dL   Creatinine, Ser 9.14 0.61 - 1.24 mg/dL   Calcium 7.8 (L) 8.9 - 10.3 mg/dL   Total Protein 5.3 (L) 6.5 - 8.1 g/dL   Albumin 2.8 (L) 3.5 - 5.0 g/dL   AST 21 15 - 41 U/L   ALT 24 0 - 44 U/L   Alkaline Phosphatase 37 (L) 38 - 126 U/L   Total Bilirubin 0.8 0.3 - 1.2 mg/dL    GFR calc non Af Amer >60 >60 mL/min   GFR calc Af Amer >60 >60 mL/min   Anion gap 6 5 - 15  Lipase, blood     Status: None   Collection Time: 09/22/18  4:19 AM  Result Value Ref Range   Lipase 24 11 - 51 U/L     Studies/Results: Ct Abdomen Pelvis W Contrast  Result Date: 09/21/2018 CLINICAL DATA:  Recent cholecystectomy, now with concern for biliary leak. Please perform CT scan to evaluate for biloma. EXAM: CT ABDOMEN AND PELVIS WITH CONTRAST TECHNIQUE: Multidetector CT imaging of the abdomen and pelvis was performed using the standard protocol following bolus administration of intravenous contrast. CONTRAST:  OMNIPAQUE IOHEXOL 300 MG/ML  SOLN COMPARISON:  CT abdomen pelvis-08/27/2018; nuclear medicine HIDA scan-10/07/2018 FINDINGS: Lower chest: Limited visualization of the lower thorax demonstrates bibasilar consolidative opacities with associated air bronchograms, right greater than left. No definite pleural effusion. Borderline cardiomegaly.  No pericardial effusion. Hepatobiliary: Normal hepatic contour. There is diffuse decreased attenuation hepatic parenchyma suggestive of hepatic steatosis. No discrete hepatic lesions. Interval cholecystectomy with small amount of fluid seen within the gallbladder fossa, about the liver edge and tracking along the right pericolic gutter to the level of the pelvic cul-de-sac without definable/drainable fluid collection or biloma. Common bile duct appears nondilated measuring approximately 0.6 cm in diameter (coronal image 45, series 5). No intrahepatic biliary duct dilatation. Pancreas: Normal appearance of the pancreas Spleen: Normal appearance of the spleen Adrenals/Urinary Tract: There is symmetric enhancement and excretion of the bilateral kidneys. Note is made of a punctate (approximately 0.4 cm) nonobstructing stone within the interpolar aspect of the right kidney (coronal image 69, series 5). Note is made of a punctate (sub 5 mm) hypoattenuating  lesion within the interpolar aspect the left kidney (coronal image 69, series 5), too small to accurately characterize of favored to represent a renal cyst. No discrete right-sided renal lesions. No urinary obstruction or perinephric stranding. Normal appearance the bilateral adrenal glands. Normal appearance of the urinary bladder given degree distention. Stomach/Bowel: Rather extensive colonic diverticulosis, primarily involving the descending and sigmoid colon, without evidence of superimposed acute diverticulitis. The bowel is otherwise normal in course and caliber without wall thickening or evidence of enteric obstruction. Normal appearance of the terminal ileum. The appendix is not visualized, however there is no pericecal inflammatory change. Note is made of a small duodenal diverticulum. There is a trace amount of  pneumoperitoneum about the liver edge likely the sequela of recent cholecystectomy state. No pneumatosis or portal venous gas. Vascular/Lymphatic: Minimal amount of mixed calcified and noncalcified atherosclerotic plaque with a normal caliber abdominal aorta, not resulting in hemodynamically significant stenosis. No bulky retroperitoneal, mesenteric, pelvic or inguinal lymphadenopathy. Reproductive: Normal appearance the prostate gland. There is a small amount of fluid seen with the pelvic cul-de-sac. Other: There is a trace amount of subcutaneous emphysema about the midline of the abdomen (representative axial image 24, series 2), likely the sequela of recent cholecystectomy state. Ill-defined stranding about the umbilicus, likely location laparoscopy port. Musculoskeletal: No acute or aggressive osseous abnormalities. Moderate to severe multilevel lumbar spine DDD, likely worse at L2-L3, L3-L4 and L5-S1 with disc space height loss, endplate irregularity small posteriorly directed disc osteophyte complexes at these locations. IMPRESSION: 1. Post cholecystectomy with trace amount of fluid within  the gallbladder fossa, about the liver edge and tracking along the right pericolic gutter to the level of the pelvic cul-de-sac without definable/drainable biloma. 2. Worsening bibasilar opacities within the imaged lung bases, likely atelectasis. 3. Rather extensive colonic diverticulitis without evidence of superimposed acute diverticulitis. 4. Suspected hepatic steatosis.  Correlation with LFTs is advised. 5. Solitary punctate (approximately 4 mm) nonobstructing right-sided renal stone. 6.  Aortic Atherosclerosis (ICD10-I70.0). Above findings discussed with Dr. Elnoria Howard at the time of examination completion. Electronically Signed   By: Simonne Come M.D.   On: 09/21/2018 11:12   Dg Ercp Biliary & Pancreatic Ducts  Result Date: 09/21/2018 CLINICAL DATA:  Bile leak. Cholecystectomy. Unsuccessful ERCP attempt. EXAM: ERCP TECHNIQUE: Multiple spot images obtained with the fluoroscopic device and submitted for interpretation post-procedure. FLUOROSCOPY TIME:  Fluoroscopy Time:  9.8 seconds Radiation Exposure Index (if provided by the fluoroscopic device): 3.24 mGy Number of Acquired Spot Images: 1 COMPARISON:  Abdominal CT 09/21/2018 FINDINGS: Single fluoroscopic image was obtained. The image demonstrates the endoscope in the upper abdomen. IMPRESSION: Fluoroscopy for an ERCP attempt. These images were submitted for radiologic interpretation only. Please see the procedural report for the amount of contrast and the fluoroscopy time utilized. Electronically Signed   By: Richarda Overlie M.D.   On: 09/21/2018 15:18    . acetaminophen  1,000 mg Oral TID  . aspirin EC  81 mg Oral Daily  . FLUoxetine  40 mg Oral Daily  . glipiZIDE  2.5 mg Oral QAC breakfast  . insulin aspart  0-15 Units Subcutaneous Q4H  . insulin glargine  10 Units Subcutaneous QHS  . lip balm  1 application Topical BID  . lisinopril  20 mg Oral Daily  . metFORMIN  1,000 mg Oral BID WC  . pantoprazole  40 mg Oral BID AC  . pravastatin  80 mg Oral Daily      Assessment/Plan: s/p Procedure(s): LAPAROSCOPY DIAGNOSTIC, WASHOUT, DRAIN PLACEMENT  Post cholecystectomy bile leak.--POD #1--laparoscopic washout, clipping of small tubular structure, placement of drains Stable and improved--with normal liver function test and drainage not profoundly bilious, would hold off on repeat ERCP for now and see how he does I explained operative findings and their implications with him Up to chair labs tomorrow Incentive spirometry-bile leak and sub-diaphragmatic irritation increases risk of atelectasis and pneumonia. Check chest x-ray Clear liquid diet Continue Zosyn   Gangrenous cholecystitis.  POD #2.  Laparoscopic cholecystectomy.  Severe chronic inflammation at time of surgery 09/20/18.   See pathology report  Type 2 diabetes without insulin    SSI every 4 hours with coverage and daily lantus. Goals  CBG 120-180. Controlled. Hypertension, benign--currently BP low normal. BMI 31 Umbilical hernia, primary repair Anxiety -takes Prozac  @PROBHOSP @  LOS: 0 days    Ernestene MentionHaywood M Trinitee Horgan 09/22/2018  . .prob

## 2018-09-22 NOTE — Progress Notes (Signed)
Subjective: Feeling better.  Objective: Vital signs in last 24 hours: Temp:  [98.5 F (36.9 C)-99.7 F (37.6 C)] 98.5 F (36.9 C) (11/15 1200) Pulse Rate:  [80-103] 94 (11/15 1300) Resp:  [0-33] 29 (11/15 1300) BP: (89-131)/(44-94) 119/70 (11/15 1300) SpO2:  [88 %-100 %] 94 % (11/15 1300) Arterial Line BP: (96-141)/(30-59) 113/53 (11/15 1300) Weight:  [107.2 kg] 107.2 kg (11/14 2130) Last BM Date: 09/19/18  Intake/Output from previous day: 11/14 0701 - 11/15 0700 In: 5857.3 [I.V.:5617.3; IV Piggyback:240] Out: 2335 [Urine:1935; Drains:350; Blood:50] Intake/Output this shift: Total I/O In: 1380.3 [P.O.:930; I.V.:450.3] Out: 1865 [Urine:1775; Drains:90]  General appearance: alert and no distress GI: tender with deeper palpation  Lab Results: Recent Labs    09/20/18 1539 09/20/18 2219 09/22/18 0059  WBC 17.8* 15.3* 16.5*  HGB 10.3* 10.6* 9.0*  HCT 30.3* 31.9* 27.0*  PLT 227 231 173   BMET Recent Labs    09/20/18 1539 09/21/18 0338 09/22/18 0419  NA 133* 134* 134*  K 3.9 4.6 4.0  CL 102 102 103  CO2 21* 24 25  GLUCOSE 301* 275* 173*  BUN 16 23 15   CREATININE 1.12 1.08 0.91  CALCIUM 8.4* 8.4* 7.8*   LFT Recent Labs    09/22/18 0419  PROT 5.3*  ALBUMIN 2.8*  AST 21  ALT 24  ALKPHOS 37*  BILITOT 0.8   PT/INR Recent Labs    09/20/18 2219  LABPROT 13.7  INR 1.06   Hepatitis Panel No results for input(s): HEPBSAG, HCVAB, HEPAIGM, HEPBIGM in the last 72 hours. C-Diff No results for input(s): CDIFFTOX in the last 72 hours. Fecal Lactopherrin No results for input(s): FECLLACTOFRN in the last 72 hours.  Studies/Results: Nm Hepatobiliary Liver Func  Result Date: 09/20/2018 CLINICAL DATA:  Status post cholecystectomy today. Abdominal pain. Evaluate for postop bile leak. EXAM: NUCLEAR MEDICINE HEPATOBILIARY IMAGING TECHNIQUE: Sequential images of the abdomen were obtained out to 60 minutes following intravenous administration of radiopharmaceutical.  RADIOPHARMACEUTICALS:  5.0 mCi Tc-24m  Choletec IV COMPARISON:  None. FINDINGS: Prompt uptake and biliary excretion of activity by the liver is seen. Gallbladder is not visualized, consistent with prior cholecystectomy. Excreted biliary activity does not enter the small bowel, but passes into area of the porta hepatis and Morison's pouch, consistent with postop bile leak. IMPRESSION: Prior cholecystectomy. Positive for postop bile leak in the right upper quadrant. These results will be called to the ordering clinician or representative by the Radiologist Assistant, and communication documented in the PACS or zVision Dashboard. Electronically Signed   By: Myles Rosenthal M.D.   On: 09/20/2018 19:41   Ct Abdomen Pelvis W Contrast  Result Date: 09/21/2018 CLINICAL DATA:  Recent cholecystectomy, now with concern for biliary leak. Please perform CT scan to evaluate for biloma. EXAM: CT ABDOMEN AND PELVIS WITH CONTRAST TECHNIQUE: Multidetector CT imaging of the abdomen and pelvis was performed using the standard protocol following bolus administration of intravenous contrast. CONTRAST:  OMNIPAQUE IOHEXOL 300 MG/ML  SOLN COMPARISON:  CT abdomen pelvis-08/27/2018; nuclear medicine HIDA scan-10/07/2018 FINDINGS: Lower chest: Limited visualization of the lower thorax demonstrates bibasilar consolidative opacities with associated air bronchograms, right greater than left. No definite pleural effusion. Borderline cardiomegaly.  No pericardial effusion. Hepatobiliary: Normal hepatic contour. There is diffuse decreased attenuation hepatic parenchyma suggestive of hepatic steatosis. No discrete hepatic lesions. Interval cholecystectomy with small amount of fluid seen within the gallbladder fossa, about the liver edge and tracking along the right pericolic gutter to the level of the pelvic cul-de-sac  without definable/drainable fluid collection or biloma. Common bile duct appears nondilated measuring approximately 0.6 cm in  diameter (coronal image 45, series 5). No intrahepatic biliary duct dilatation. Pancreas: Normal appearance of the pancreas Spleen: Normal appearance of the spleen Adrenals/Urinary Tract: There is symmetric enhancement and excretion of the bilateral kidneys. Note is made of a punctate (approximately 0.4 cm) nonobstructing stone within the interpolar aspect of the right kidney (coronal image 69, series 5). Note is made of a punctate (sub 5 mm) hypoattenuating lesion within the interpolar aspect the left kidney (coronal image 69, series 5), too small to accurately characterize of favored to represent a renal cyst. No discrete right-sided renal lesions. No urinary obstruction or perinephric stranding. Normal appearance the bilateral adrenal glands. Normal appearance of the urinary bladder given degree distention. Stomach/Bowel: Rather extensive colonic diverticulosis, primarily involving the descending and sigmoid colon, without evidence of superimposed acute diverticulitis. The bowel is otherwise normal in course and caliber without wall thickening or evidence of enteric obstruction. Normal appearance of the terminal ileum. The appendix is not visualized, however there is no pericecal inflammatory change. Note is made of a small duodenal diverticulum. There is a trace amount of pneumoperitoneum about the liver edge likely the sequela of recent cholecystectomy state. No pneumatosis or portal venous gas. Vascular/Lymphatic: Minimal amount of mixed calcified and noncalcified atherosclerotic plaque with a normal caliber abdominal aorta, not resulting in hemodynamically significant stenosis. No bulky retroperitoneal, mesenteric, pelvic or inguinal lymphadenopathy. Reproductive: Normal appearance the prostate gland. There is a small amount of fluid seen with the pelvic cul-de-sac. Other: There is a trace amount of subcutaneous emphysema about the midline of the abdomen (representative axial image 24, series 2), likely the  sequela of recent cholecystectomy state. Ill-defined stranding about the umbilicus, likely location laparoscopy port. Musculoskeletal: No acute or aggressive osseous abnormalities. Moderate to severe multilevel lumbar spine DDD, likely worse at L2-L3, L3-L4 and L5-S1 with disc space height loss, endplate irregularity small posteriorly directed disc osteophyte complexes at these locations. IMPRESSION: 1. Post cholecystectomy with trace amount of fluid within the gallbladder fossa, about the liver edge and tracking along the right pericolic gutter to the level of the pelvic cul-de-sac without definable/drainable biloma. 2. Worsening bibasilar opacities within the imaged lung bases, likely atelectasis. 3. Rather extensive colonic diverticulitis without evidence of superimposed acute diverticulitis. 4. Suspected hepatic steatosis.  Correlation with LFTs is advised. 5. Solitary punctate (approximately 4 mm) nonobstructing right-sided renal stone. 6.  Aortic Atherosclerosis (ICD10-I70.0). Above findings discussed with Dr. Elnoria HowardHung at the time of examination completion. Electronically Signed   By: Simonne ComeJohn  Watts M.D.   On: 09/21/2018 11:12   Dg Chest Port 1 View  Result Date: 09/22/2018 CLINICAL DATA:  Postoperative complication. Bile leak. Repeat operation yesterday. EXAM: PORTABLE CHEST 1 VIEW COMPARISON:  09/21/2018 FINDINGS: Progression of right lower lobe and right upper lobe airspace disease. Progression of left lower lobe airspace disease. Small pleural effusions. No pneumothorax. IMPRESSION: Progression of right upper lobe and right lower lobe atelectasis/infiltrate. Progressive left lower lobe atelectasis. Small pleural effusions. No pneumothorax. Electronically Signed   By: Marlan Palauharles  Clark M.D.   On: 09/22/2018 09:25   Dg Ercp Biliary & Pancreatic Ducts  Result Date: 09/21/2018 CLINICAL DATA:  Bile leak. Cholecystectomy. Unsuccessful ERCP attempt. EXAM: ERCP TECHNIQUE: Multiple spot images obtained with the  fluoroscopic device and submitted for interpretation post-procedure. FLUOROSCOPY TIME:  Fluoroscopy Time:  9.8 seconds Radiation Exposure Index (if provided by the fluoroscopic device): 3.24 mGy Number of Acquired  Spot Images: 1 COMPARISON:  Abdominal CT 09/21/2018 FINDINGS: Single fluoroscopic image was obtained. The image demonstrates the endoscope in the upper abdomen. IMPRESSION: Fluoroscopy for an ERCP attempt. These images were submitted for radiologic interpretation only. Please see the procedural report for the amount of contrast and the fluoroscopy time utilized. Electronically Signed   By: Richarda Overlie M.D.   On: 09/21/2018 15:18    Medications:  Scheduled: . acetaminophen  1,000 mg Oral TID  . aspirin EC  81 mg Oral Daily  . FLUoxetine  40 mg Oral Daily  . glipiZIDE  2.5 mg Oral QAC breakfast  . insulin aspart  0-15 Units Subcutaneous Q4H  . insulin glargine  10 Units Subcutaneous QHS  . lip balm  1 application Topical BID  . lisinopril  20 mg Oral Daily  . metFORMIN  1,000 mg Oral BID WC  . pantoprazole  40 mg Oral BID AC  . pravastatin  80 mg Oral Daily   Continuous: . sodium chloride Stopped (09/21/18 1219)  . lactated ringers 75 mL/hr at 09/22/18 1301  . piperacillin-tazobactam (ZOSYN)  IV 3.375 g (09/22/18 1311)  . sodium chloride      Assessment/Plan: 1) Bile leak. 2) ABM pain.   He is feeling much better.  90 ml of fluid removed from the JP drain, so far, today.  The op note was reviewed.  Hopefully the bile leak was arrested with the surgery.  If he continues to drain, he is already scheduled to undergo a repeat ERCP with stent placement with Dr. Ewing Schlein this Monday at 1 PM.  Plan: 1) Follow JP drainage. 2) Dr. Chales Abrahams will round on the patient this weekend.  LOS: 0 days   Kabrina Christiano D 09/22/2018, 1:40 PM

## 2018-09-23 DIAGNOSIS — K8000 Calculus of gallbladder with acute cholecystitis without obstruction: Secondary | ICD-10-CM

## 2018-09-23 LAB — GLUCOSE, CAPILLARY
Glucose-Capillary: 102 mg/dL — ABNORMAL HIGH (ref 70–99)
Glucose-Capillary: 105 mg/dL — ABNORMAL HIGH (ref 70–99)
Glucose-Capillary: 116 mg/dL — ABNORMAL HIGH (ref 70–99)
Glucose-Capillary: 124 mg/dL — ABNORMAL HIGH (ref 70–99)
Glucose-Capillary: 70 mg/dL (ref 70–99)
Glucose-Capillary: 98 mg/dL (ref 70–99)

## 2018-09-23 LAB — COMPREHENSIVE METABOLIC PANEL
ALT: 20 U/L (ref 0–44)
AST: 17 U/L (ref 15–41)
Albumin: 2.5 g/dL — ABNORMAL LOW (ref 3.5–5.0)
Alkaline Phosphatase: 38 U/L (ref 38–126)
Anion gap: 5 (ref 5–15)
BUN: 7 mg/dL — ABNORMAL LOW (ref 8–23)
CO2: 26 mmol/L (ref 22–32)
Calcium: 8.1 mg/dL — ABNORMAL LOW (ref 8.9–10.3)
Chloride: 109 mmol/L (ref 98–111)
Creatinine, Ser: 0.78 mg/dL (ref 0.61–1.24)
GFR calc Af Amer: >60 mL/min (ref >60)
GFR calc non Af Amer: >60 mL/min (ref >60)
Glucose, Bld: 114 mg/dL — ABNORMAL HIGH (ref 70–99)
Potassium: 3.5 mmol/L (ref 3.5–5.1)
Sodium: 140 mmol/L (ref 135–145)
Total Bilirubin: 0.6 mg/dL (ref 0.3–1.2)
Total Protein: 5.1 g/dL — ABNORMAL LOW (ref 6.5–8.1)

## 2018-09-23 LAB — CBC
HCT: 26.1 % — ABNORMAL LOW (ref 39.0–52.0)
Hemoglobin: 8.4 g/dL — ABNORMAL LOW (ref 13.0–17.0)
MCH: 32.3 pg (ref 26.0–34.0)
MCHC: 32.2 g/dL (ref 30.0–36.0)
MCV: 100.4 fL — ABNORMAL HIGH (ref 80.0–100.0)
Platelets: 173 10*3/uL (ref 150–400)
RBC: 2.6 MIL/uL — ABNORMAL LOW (ref 4.22–5.81)
RDW: 13.1 % (ref 11.5–15.5)
WBC: 7.2 10*3/uL (ref 4.0–10.5)
nRBC: 0 % (ref 0.0–0.2)

## 2018-09-23 NOTE — Progress Notes (Addendum)
Progress Note Covering for Dr. Elnoria HowardHung over the weekend   Subjective  Chief Complaint: Bile leak  This morning patient is found drowsy, tells me he is in no acute pain.  Small amount of bilious JP drainage about 25 cc.    Objective   Vital signs in last 24 hours: Temp:  [97.8 F (36.6 C)-99.2 F (37.3 C)] 98.6 F (37 C) (11/16 0800) Pulse Rate:  [70-94] 70 (11/16 0400) Resp:  [13-29] 13 (11/16 0400) BP: (99-151)/(54-76) 129/76 (11/16 0400) SpO2:  [94 %-97 %] 96 % (11/16 0400) Arterial Line BP: (96-126)/(46-65) 119/63 (11/16 0400) Last BM Date: 09/19/18 General:   Lethargic white male in NAD Heart:  Regular rate and rhythm; no murmurs Lungs: Respirations even and unlabored, lungs CTA bilaterally Abdomen:  Soft, nontender and nondistended. Normal bowel sounds. JP drain in place with drainage 25cc of bilious bloody material Psych:  Cooperative. Normal mood and affect.  Intake/Output from previous day: 11/15 0701 - 11/16 0700 In: 2658.4 [P.O.:930; I.V.:1628.4; IV Piggyback:100] Out: 3870 [Urine:3620; Drains:250] Intake/Output this shift: Total I/O In: -  Out: 175 [Urine:175]  Lab Results: Recent Labs    09/20/18 2219 09/22/18 0059 09/23/18 0254  WBC 15.3* 16.5* 7.2  HGB 10.6* 9.0* 8.4*  HCT 31.9* 27.0* 26.1*  PLT 231 173 173   BMET Recent Labs    09/21/18 0338 09/22/18 0419 09/23/18 0254  NA 134* 134* 140  K 4.6 4.0 3.5  CL 102 103 109  CO2 24 25 26   GLUCOSE 275* 173* 114*  BUN 23 15 7*  CREATININE 1.08 0.91 0.78  CALCIUM 8.4* 7.8* 8.1*   LFT Recent Labs    09/23/18 0254  PROT 5.1*  ALBUMIN 2.5*  AST 17  ALT 20  ALKPHOS 38  BILITOT 0.6   PT/INR Recent Labs    09/20/18 2219  LABPROT 13.7  INR 1.06    Studies/Results: Ct Abdomen Pelvis W Contrast  Result Date: 09/21/2018 CLINICAL DATA:  Recent cholecystectomy, now with concern for biliary leak. Please perform CT scan to evaluate for biloma. EXAM: CT ABDOMEN AND PELVIS WITH CONTRAST  TECHNIQUE: Multidetector CT imaging of the abdomen and pelvis was performed using the standard protocol following bolus administration of intravenous contrast. CONTRAST:  100mL OMNIPAQUE IOHEXOL 300 MG/ML  SOLN COMPARISON:  CT abdomen pelvis-08/27/2018; nuclear medicine HIDA scan-10/07/2018 FINDINGS: Lower chest: Limited visualization of the lower thorax demonstrates bibasilar consolidative opacities with associated air bronchograms, right greater than left. No definite pleural effusion. Borderline cardiomegaly.  No pericardial effusion. Hepatobiliary: Normal hepatic contour. There is diffuse decreased attenuation hepatic parenchyma suggestive of hepatic steatosis. No discrete hepatic lesions. Interval cholecystectomy with small amount of fluid seen within the gallbladder fossa, about the liver edge and tracking along the right pericolic gutter to the level of the pelvic cul-de-sac without definable/drainable fluid collection or biloma. Common bile duct appears nondilated measuring approximately 0.6 cm in diameter (coronal image 45, series 5). No intrahepatic biliary duct dilatation. Pancreas: Normal appearance of the pancreas Spleen: Normal appearance of the spleen Adrenals/Urinary Tract: There is symmetric enhancement and excretion of the bilateral kidneys. Note is made of a punctate (approximately 0.4 cm) nonobstructing stone within the interpolar aspect of the right kidney (coronal image 69, series 5). Note is made of a punctate (sub 5 mm) hypoattenuating lesion within the interpolar aspect the left kidney (coronal image 69, series 5), too small to accurately characterize of favored to represent a renal cyst. No discrete right-sided renal lesions. No urinary obstruction  or perinephric stranding. Normal appearance the bilateral adrenal glands. Normal appearance of the urinary bladder given degree distention. Stomach/Bowel: Rather extensive colonic diverticulosis, primarily involving the descending and sigmoid  colon, without evidence of superimposed acute diverticulitis. The bowel is otherwise normal in course and caliber without wall thickening or evidence of enteric obstruction. Normal appearance of the terminal ileum. The appendix is not visualized, however there is no pericecal inflammatory change. Note is made of a small duodenal diverticulum. There is a trace amount of pneumoperitoneum about the liver edge likely the sequela of recent cholecystectomy state. No pneumatosis or portal venous gas. Vascular/Lymphatic: Minimal amount of mixed calcified and noncalcified atherosclerotic plaque with a normal caliber abdominal aorta, not resulting in hemodynamically significant stenosis. No bulky retroperitoneal, mesenteric, pelvic or inguinal lymphadenopathy. Reproductive: Normal appearance the prostate gland. There is a small amount of fluid seen with the pelvic cul-de-sac. Other: There is a trace amount of subcutaneous emphysema about the midline of the abdomen (representative axial image 24, series 2), likely the sequela of recent cholecystectomy state. Ill-defined stranding about the umbilicus, likely location laparoscopy port. Musculoskeletal: No acute or aggressive osseous abnormalities. Moderate to severe multilevel lumbar spine DDD, likely worse at L2-L3, L3-L4 and L5-S1 with disc space height loss, endplate irregularity small posteriorly directed disc osteophyte complexes at these locations. IMPRESSION: 1. Post cholecystectomy with trace amount of fluid within the gallbladder fossa, about the liver edge and tracking along the right pericolic gutter to the level of the pelvic cul-de-sac without definable/drainable biloma. 2. Worsening bibasilar opacities within the imaged lung bases, likely atelectasis. 3. Rather extensive colonic diverticulitis without evidence of superimposed acute diverticulitis. 4. Suspected hepatic steatosis.  Correlation with LFTs is advised. 5. Solitary punctate (approximately 4 mm)  nonobstructing right-sided renal stone. 6.  Aortic Atherosclerosis (ICD10-I70.0). Above findings discussed with Dr. Elnoria Howard at the time of examination completion. Electronically Signed   By: Simonne Come M.D.   On: 09/21/2018 11:12   Dg Chest Port 1 View  Result Date: 09/22/2018 CLINICAL DATA:  Postoperative complication. Bile leak. Repeat operation yesterday. EXAM: PORTABLE CHEST 1 VIEW COMPARISON:  09/21/2018 FINDINGS: Progression of right lower lobe and right upper lobe airspace disease. Progression of left lower lobe airspace disease. Small pleural effusions. No pneumothorax. IMPRESSION: Progression of right upper lobe and right lower lobe atelectasis/infiltrate. Progressive left lower lobe atelectasis. Small pleural effusions. No pneumothorax. Electronically Signed   By: Marlan Palau M.D.   On: 09/22/2018 09:25   Dg Ercp Biliary & Pancreatic Ducts  Result Date: 09/21/2018 CLINICAL DATA:  Bile leak. Cholecystectomy. Unsuccessful ERCP attempt. EXAM: ERCP TECHNIQUE: Multiple spot images obtained with the fluoroscopic device and submitted for interpretation post-procedure. FLUOROSCOPY TIME:  Fluoroscopy Time:  9.8 seconds Radiation Exposure Index (if provided by the fluoroscopic device): 3.24 mGy Number of Acquired Spot Images: 1 COMPARISON:  Abdominal CT 09/21/2018 FINDINGS: Single fluoroscopic image was obtained. The image demonstrates the endoscope in the upper abdomen. IMPRESSION: Fluoroscopy for an ERCP attempt. These images were submitted for radiologic interpretation only. Please see the procedural report for the amount of contrast and the fluoroscopy time utilized. Electronically Signed   By: Richarda Overlie M.D.   On: 09/21/2018 15:18    Assessment / Plan:   Assessment: 1.  Bile duct leak: Status post bile leak post laparoscopic cholecystectomy, initial unsuccessful ERCP, repeat laparoscopic procedure with washout and drain placement 09/22/2018  Plan: 1.  Continue current management 2.  We will  continue to monitor drainage, if this continues to  decrease/ be minimal then patient will not need a repeat ERCP 3.  Please await any further recommendations from Dr. Chales Abrahams later today    LOS: 1 day   Violet Baldy [facility]  09/23/2018, 9:28 AM   Attending physician's note   I have taken an interval history, reviewed the chart and examined the patient. I agree with the Advanced Practitioner's note, impression and recommendations.   67 year old with acute gangrenous cholecystitis s/p lap chole complicated by bile leak s/p failed ERCP due to periampullary diverticulum, rpt lap with sig bile aspiration, clipping of cystic stump leak, drain placement 09/22/2018. Doing well. JP drain output 25 cc, bilious, Nl LFTs. Has mild aspiration pneumonia LLQ on Zosyn.  Plan: Continue supportive treatment for now.  Will monitor and determine need for ERCP.  He is already scheduled for ERCP on Monday.  Edman Circle, MD

## 2018-09-23 NOTE — Progress Notes (Signed)
Patient refuses to walk in the halls for now.  Did get OOB to chair with assistance.  Continue to try and encourage increase in his activity.  Report called to the floor.  Mathew Storck Debroah LoopArnold RN

## 2018-09-23 NOTE — Progress Notes (Signed)
Pharmacy Antibiotic Note  Chad Cox is a 67 y.o. male admitted on 09/20/2018 with intra-abdominal infection.  Pharmacy has been consulted for piperacillin/tazobactam dosing. No antibiotic allergies.  Patient underwent laparoscopic cholecystectomy with primary umbilical hernia repair. Pt received cefazolin for surgical prophylaxis. Pharmacy consulted for piperacillin/tazobactam post-op.   Today, 09/23/18 - Day #4 abx  WBC 7.2, improved  Afebrile  SCr 0.78, improved CrCl >100 mL/min  Plan:  Continue Piperacillin/tazobactam 3.375 g IV q8h EI  Do dose adjustments necessary, Pharmacy will sign off  Height: 6' (182.9 cm) Weight: 236 lb 5.3 oz (107.2 kg) IBW/kg (Calculated) : 77.6  Temp (24hrs), Avg:98.3 F (36.8 C), Min:97.8 F (36.6 C), Max:99.2 F (37.3 C)  Recent Labs  Lab 09/18/18 0823 09/20/18 1539 09/20/18 2219 09/21/18 0338 09/22/18 0059 09/22/18 0419 09/23/18 0254  WBC 8.3 17.8* 15.3*  --  16.5*  --  7.2  CREATININE 1.04 1.12  --  1.08  --  0.91 0.78    Estimated Creatinine Clearance: 114.9 mL/min (by C-G formula based on SCr of 0.78 mg/dL).    Allergies  Allergen Reactions  . Adhesive [Tape] Other (See Comments)    "acts like poison ivy" blisters up, itching  . Betadine [Povidone Iodine] Other (See Comments)    Blisters, itching   Antimicrobials this admission: Piperacillin/tazobactam 11/13 >>  Cefazolin for surgical prophylaxis 11/13  Dose adjustments this admission:  Microbiology results: 11/14 MRSA PCR: negative  Thank you for allowing pharmacy to be a part of this patient's care.  Loralee PacasErin Abbigayle Toole, PharmD, BCPS Pager: [phone] 09/23/2018 8:42 AM

## 2018-09-23 NOTE — Progress Notes (Signed)
Patient ID: Chad Cox, male   DOB: 1951/05/26, 67 y.o.   MRN: [id number] 2 Days Post-Op   Subjective: Overall feels better.  Some upper abdominal pain with motion.  Tolerated clear liquids.  Denies shortness of breath or cough.  Objective: Vital signs in last 24 hours: Temp:  [97.8 F (36.6 C)-99.2 F (37.3 C)] 97.9 F (36.6 C) (11/16 0317) Pulse Rate:  [70-94] 70 (11/16 0400) Resp:  [13-29] 13 (11/16 0400) BP: (94-151)/(48-76) 129/76 (11/16 0400) SpO2:  [94 %-97 %] 96 % (11/16 0400) Arterial Line BP: (96-126)/(45-65) 119/63 (11/16 0400) Last BM Date: 09/19/18  Intake/Output from previous day: 11/15 0701 - 11/16 0700 In: 2658.4 [P.O.:930; I.V.:1628.4; IV Piggyback:100] Out: 8453 [Urine:3620; Drains:250] Intake/Output this shift: Total I/O In: -  Out: 175 [Urine:175]  General appearance: alert, cooperative and no distress Resp: clear to auscultation bilaterally GI: Mild upper abdominal tenderness.  Small amount of JP drainage but it is bilious.  Lab Results:  Recent Labs    09/22/18 0059 09/23/18 0254  WBC 16.5* 7.2  HGB 9.0* 8.4*  HCT 27.0* 26.1*  PLT 173 173   BMET Recent Labs    09/22/18 0419 09/23/18 0254  NA 134* 140  K 4.0 3.5  CL 103 109  CO2 25 26  GLUCOSE 173* 114*  BUN 15 7*  CREATININE 0.91 0.78  CALCIUM 7.8* 8.1*   Hepatic Function Latest Ref Rng & Units 09/23/2018 09/22/2018 09/21/2018  Total Protein 6.5 - 8.1 g/dL 5.1(L) 5.3(L) 6.2(L)  Albumin 3.5 - 5.0 g/dL 2.5(L) 2.8(L) 3.5  AST 15 - 41 U/L '17 21 31  ' ALT 0 - 44 U/L 20 24 33  Alk Phosphatase 38 - 126 U/L 38 37(L) 45  Total Bilirubin 0.3 - 1.2 mg/dL 0.6 0.8 0.7  Bilirubin, Direct 0.1 - 0.5 mg/dL - - -    Studies/Results: Ct Abdomen Pelvis W Contrast  Result Date: 09/21/2018 CLINICAL DATA:  Recent cholecystectomy, now with concern for biliary leak. Please perform CT scan to evaluate for biloma. EXAM: CT ABDOMEN AND PELVIS WITH CONTRAST TECHNIQUE: Multidetector CT imaging of the  abdomen and pelvis was performed using the standard protocol following bolus administration of intravenous contrast. CONTRAST:  173m OMNIPAQUE IOHEXOL 300 MG/ML  SOLN COMPARISON:  CT abdomen pelvis-08/27/2018; nuclear medicine HIDA scan-10/07/2018 FINDINGS: Lower chest: Limited visualization of the lower thorax demonstrates bibasilar consolidative opacities with associated air bronchograms, right greater than left. No definite pleural effusion. Borderline cardiomegaly.  No pericardial effusion. Hepatobiliary: Normal hepatic contour. There is diffuse decreased attenuation hepatic parenchyma suggestive of hepatic steatosis. No discrete hepatic lesions. Interval cholecystectomy with small amount of fluid seen within the gallbladder fossa, about the liver edge and tracking along the right pericolic gutter to the level of the pelvic cul-de-sac without definable/drainable fluid collection or biloma. Common bile duct appears nondilated measuring approximately 0.6 cm in diameter (coronal image 45, series 5). No intrahepatic biliary duct dilatation. Pancreas: Normal appearance of the pancreas Spleen: Normal appearance of the spleen Adrenals/Urinary Tract: There is symmetric enhancement and excretion of the bilateral kidneys. Note is made of a punctate (approximately 0.4 cm) nonobstructing stone within the interpolar aspect of the right kidney (coronal image 69, series 5). Note is made of a punctate (sub 5 mm) hypoattenuating lesion within the interpolar aspect the left kidney (coronal image 69, series 5), too small to accurately characterize of favored to represent a renal cyst. No discrete right-sided renal lesions. No urinary obstruction or perinephric stranding. Normal appearance the bilateral adrenal glands.  Normal appearance of the urinary bladder given degree distention. Stomach/Bowel: Rather extensive colonic diverticulosis, primarily involving the descending and sigmoid colon, without evidence of superimposed acute  diverticulitis. The bowel is otherwise normal in course and caliber without wall thickening or evidence of enteric obstruction. Normal appearance of the terminal ileum. The appendix is not visualized, however there is no pericecal inflammatory change. Note is made of a small duodenal diverticulum. There is a trace amount of pneumoperitoneum about the liver edge likely the sequela of recent cholecystectomy state. No pneumatosis or portal venous gas. Vascular/Lymphatic: Minimal amount of mixed calcified and noncalcified atherosclerotic plaque with a normal caliber abdominal aorta, not resulting in hemodynamically significant stenosis. No bulky retroperitoneal, mesenteric, pelvic or inguinal lymphadenopathy. Reproductive: Normal appearance the prostate gland. There is a small amount of fluid seen with the pelvic cul-de-sac. Other: There is a trace amount of subcutaneous emphysema about the midline of the abdomen (representative axial image 24, series 2), likely the sequela of recent cholecystectomy state. Ill-defined stranding about the umbilicus, likely location laparoscopy port. Musculoskeletal: No acute or aggressive osseous abnormalities. Moderate to severe multilevel lumbar spine DDD, likely worse at L2-L3, L3-L4 and L5-S1 with disc space height loss, endplate irregularity small posteriorly directed disc osteophyte complexes at these locations. IMPRESSION: 1. Post cholecystectomy with trace amount of fluid within the gallbladder fossa, about the liver edge and tracking along the right pericolic gutter to the level of the pelvic cul-de-sac without definable/drainable biloma. 2. Worsening bibasilar opacities within the imaged lung bases, likely atelectasis. 3. Rather extensive colonic diverticulitis without evidence of superimposed acute diverticulitis. 4. Suspected hepatic steatosis.  Correlation with LFTs is advised. 5. Solitary punctate (approximately 4 mm) nonobstructing right-sided renal stone. 6.  Aortic  Atherosclerosis (ICD10-I70.0). Above findings discussed with Dr. Benson Norway at the time of examination completion. Electronically Signed   By: Sandi Mariscal M.D.   On: 09/21/2018 11:12   Dg Chest Port 1 View  Result Date: 09/22/2018 CLINICAL DATA:  Postoperative complication. Bile leak. Repeat operation yesterday. EXAM: PORTABLE CHEST 1 VIEW COMPARISON:  09/21/2018 FINDINGS: Progression of right lower lobe and right upper lobe airspace disease. Progression of left lower lobe airspace disease. Small pleural effusions. No pneumothorax. IMPRESSION: Progression of right upper lobe and right lower lobe atelectasis/infiltrate. Progressive left lower lobe atelectasis. Small pleural effusions. No pneumothorax. Electronically Signed   By: Franchot Gallo M.D.   On: 09/22/2018 09:25   Dg Ercp Biliary & Pancreatic Ducts  Result Date: 09/21/2018 CLINICAL DATA:  Bile leak. Cholecystectomy. Unsuccessful ERCP attempt. EXAM: ERCP TECHNIQUE: Multiple spot images obtained with the fluoroscopic device and submitted for interpretation post-procedure. FLUOROSCOPY TIME:  Fluoroscopy Time:  9.8 seconds Radiation Exposure Index (if provided by the fluoroscopic device): 3.24 mGy Number of Acquired Spot Images: 1 COMPARISON:  Abdominal CT 09/21/2018 FINDINGS: Single fluoroscopic image was obtained. The image demonstrates the endoscope in the upper abdomen. IMPRESSION: Fluoroscopy for an ERCP attempt. These images were submitted for radiologic interpretation only. Please see the procedural report for the amount of contrast and the fluoroscopy time utilized. Electronically Signed   By: Markus Daft M.D.   On: 09/21/2018 15:18    Anti-infectives: Anti-infectives (From admission, onward)   Start     Dose/Rate Route Frequency Ordered Stop   09/21/18 1900  piperacillin-tazobactam (ZOSYN) 3.375 GM/50ML IVPB    Note to Pharmacy:  Salome Spotted   : cabinet override      09/21/18 1900 09/22/18 0714   09/21/18 0600  piperacillin-tazobactam  (ZOSYN) IVPB  3.375 g     3.375 g 12.5 mL/hr over 240 Minutes Intravenous Every 8 hours 09/20/18 2026     09/20/18 2045  piperacillin-tazobactam (ZOSYN) IVPB 3.375 g     3.375 g 100 mL/hr over 30 Minutes Intravenous  Once 09/20/18 2025 09/20/18 2206   09/20/18 0915  ceFAZolin (ANCEF) IVPB 2g/100 mL premix     2 g 200 mL/hr over 30 Minutes Intravenous On call to O.R. 09/20/18 0903 09/20/18 1046      Assessment/Plan: Bile leak post laparoscopic cholecystectomy Initial unsuccessful ERCP S/p Procedure(s): LAPAROSCOPY DIAGNOSTIC, WASHOUT, DRAIN PLACEMENT Overall appears improved after washout.  Still has apparent bile leak today. History of possible aspiration during ERCP and chest x-ray does show some increased consolidation left lower lobe.  On appropriate antibiotics. Patient appears very stable and okay for transfer to the floor.   LOS: 1 day    Edward Jolly 09/23/2018

## 2018-09-24 ENCOUNTER — Inpatient Hospital Stay (HOSPITAL_COMMUNITY): Payer: BLUE CROSS/BLUE SHIELD

## 2018-09-24 LAB — GLUCOSE, CAPILLARY
Glucose-Capillary: 124 mg/dL — ABNORMAL HIGH (ref 70–99)
Glucose-Capillary: 105 mg/dL — ABNORMAL HIGH (ref 70–99)
Glucose-Capillary: 113 mg/dL — ABNORMAL HIGH (ref 70–99)
Glucose-Capillary: 93 mg/dL (ref 70–99)
Glucose-Capillary: 122 mg/dL — ABNORMAL HIGH (ref 70–99)

## 2018-09-24 LAB — CBC
HCT: 27.8 % — ABNORMAL LOW (ref 39.0–52.0)
Hemoglobin: 9.1 g/dL — ABNORMAL LOW (ref 13.0–17.0)
MCH: 33.1 pg (ref 26.0–34.0)
MCHC: 32.7 g/dL (ref 30.0–36.0)
MCV: 101.1 fL — ABNORMAL HIGH (ref 80.0–100.0)
Platelets: 204 10*3/uL (ref 150–400)
RBC: 2.75 MIL/uL — ABNORMAL LOW (ref 4.22–5.81)
RDW: 12.9 % (ref 11.5–15.5)
WBC: 9.1 10*3/uL (ref 4.0–10.5)
nRBC: 0 % (ref 0.0–0.2)

## 2018-09-24 NOTE — Progress Notes (Addendum)
Pt sat up in chair for several hours, slept in chair. Family at bedside. Pt states he will walk tonight.

## 2018-09-24 NOTE — Progress Notes (Signed)
3 Days Post-Op   Subjective/Chief Complaint: Complains of soreness   Objective: Vital signs in last 24 hours: Temp:  [98 F (36.7 C)-99.5 F (37.5 C)] 98.5 F (36.9 C) (11/17 0554) Pulse Rate:  [72-84] 72 (11/17 0554) Resp:  [15-20] 15 (11/17 0554) BP: (118-133)/(69-77) 133/77 (11/17 0554) SpO2:  [90 %-95 %] 95 % (11/17 0554) Last BM Date: 09/19/18  Intake/Output from previous day: 11/16 0701 - 11/17 0700 In: 2290.3 [P.O.:680; I.V.:1410.5; IV Piggyback:199.8] Out: 3405 [Urine:3250; Drains:155] Intake/Output this shift: No intake/output data recorded.  General appearance: alert and cooperative Resp: clear to auscultation bilaterally Cardio: regular rate and rhythm GI: soft, tendern on right. drain output bilious but decreasing  Lab Results:  Recent Labs    09/23/18 0254 09/24/18 0408  WBC 7.2 9.1  HGB 8.4* 9.1*  HCT 26.1* 27.8*  PLT 173 204   BMET Recent Labs    09/22/18 0419 09/23/18 0254  NA 134* 140  K 4.0 3.5  CL 103 109  CO2 25 26  GLUCOSE 173* 114*  BUN 15 7*  CREATININE 0.91 0.78  CALCIUM 7.8* 8.1*   PT/INR No results for input(s): LABPROT, INR in the last 72 hours. ABG No results for input(s): PHART, HCO3 in the last 72 hours.  Invalid input(s): PCO2, PO2  Studies/Results: Dg Chest 2 View  Result Date: 09/24/2018 CLINICAL DATA:  Shortness of breath EXAM: CHEST - 2 VIEW COMPARISON:  09/22/2018 FINDINGS: Cardiac shadow is mildly enlarged but stable. The lungs are well aerated bilaterally. Significant improved aeration is noted in the right mid lung. Some persistent infiltrate/atelectasis is noted in the bases bilaterally but also mildly improved. Small pleural effusions are noted bilaterally. No acute bony abnormality is seen. Catheter is noted in the upper abdomen on the lateral projection. IMPRESSION: Improved aeration with some persistent infiltrates in the bases as well as small effusions. Electronically Signed   By: Alcide CleverMark  Lukens M.D.   On:  09/24/2018 08:52    Anti-infectives: Anti-infectives (From admission, onward)   Start     Dose/Rate Route Frequency Ordered Stop   09/21/18 1900  piperacillin-tazobactam (ZOSYN) 3.375 GM/50ML IVPB    Note to Pharmacy:  Chad Cox   : cabinet override      09/21/18 1900 09/22/18 0714   09/21/18 0600  piperacillin-tazobactam (ZOSYN) IVPB 3.375 g     3.375 g 12.5 mL/hr over 240 Minutes Intravenous Every 8 hours 09/20/18 2026     09/20/18 2045  piperacillin-tazobactam (ZOSYN) IVPB 3.375 g     3.375 g 100 mL/hr over 30 Minutes Intravenous  Once 09/20/18 2025 09/20/18 2206   09/20/18 0915  ceFAZolin (ANCEF) IVPB 2g/100 mL premix     2 g 200 mL/hr over 30 Minutes Intravenous On call to O.R. 09/20/18 0903 09/20/18 1046      Assessment/Plan: s/p Procedure(s): LAPAROSCOPY DIAGNOSTIC, WASHOUT, DRAIN PLACEMENT (N/A) stay on liquids for possible ERCP tomorrow  Continue drain Continue IV zosyn  LOS: 2 days    Chad Cox 09/24/2018

## 2018-09-24 NOTE — Progress Notes (Addendum)
Progress Note Covering for Dr. Elnoria HowardHung over the weekend   Subjective  Chief Complaint: Bile leak  This morning patient tells me that he is feeling much better, he is having some right upper quadrant discomfort in area of drain placement if he moves around very much.  Reports surgeon was happy with decreased amount of drainage.   Objective   Vital signs in last 24 hours: Temp:  [98 F (36.7 C)-99.5 F (37.5 C)] 98.5 F (36.9 C) (11/17 0554) Pulse Rate:  [72-84] 72 (11/17 0554) Resp:  [15-20] 15 (11/17 0554) BP: (118-133)/(69-77) 133/77 (11/17 1000) SpO2:  [90 %-95 %] 95 % (11/17 0554) Last BM Date: 09/19/18 General:    white male in NAD Heart:  Regular rate and rhythm; no murmurs Lungs: Respirations even and unlabored, lungs CTA bilaterally Abdomen:  Soft, nontender and nondistended. Normal bowel sounds.  Positive JP drain in place with drainage of 5 cc of bilious material Extremities:  Without edema. Neurologic:  Alert and oriented Psych:  Cooperative. Normal mood and affect.  Intake/Output from previous day: 11/16 0701 - 11/17 0700 In: 2290.3 [P.O.:680; I.V.:1410.5; IV Piggyback:199.8] Out: 3405 [Urine:3250; Drains:155] Intake/Output this shift: Total I/O In: 120 [P.O.:120] Out: 420 [Urine:400; Drains:20]  Lab Results: Recent Labs    09/22/18 0059 09/23/18 0254 09/24/18 0408  WBC 16.5* 7.2 9.1  HGB 9.0* 8.4* 9.1*  HCT 27.0* 26.1* 27.8*  PLT 173 173 204   BMET Recent Labs    09/22/18 0419 09/23/18 0254  NA 134* 140  K 4.0 3.5  CL 103 109  CO2 25 26  GLUCOSE 173* 114*  BUN 15 7*  CREATININE 0.91 0.78  CALCIUM 7.8* 8.1*   LFT Recent Labs    09/23/18 0254  PROT 5.1*  ALBUMIN 2.5*  AST 17  ALT 20  ALKPHOS 38  BILITOT 0.6   Studies/Results: Dg Chest 2 View  Result Date: 09/24/2018 CLINICAL DATA:  Shortness of breath EXAM: CHEST - 2 VIEW COMPARISON:  09/22/2018 FINDINGS: Cardiac shadow is mildly enlarged but stable. The lungs are well aerated  bilaterally. Significant improved aeration is noted in the right mid lung. Some persistent infiltrate/atelectasis is noted in the bases bilaterally but also mildly improved. Small pleural effusions are noted bilaterally. No acute bony abnormality is seen. Catheter is noted in the upper abdomen on the lateral projection. IMPRESSION: Improved aeration with some persistent infiltrates in the bases as well as small effusions. Electronically Signed   By: Alcide CleverMark  Lukens M.D.   On: 09/24/2018 08:52    Assessment / Plan:   Assessment: 1.  Bile duct leak: Acute gangrenous cholecystitis status post lap chole complicated by bile leak status post failed ERCP due to periampullary diverticulum, repeat laparoscopy with significant bile aspiration, clipping of cystic stump leak, drain placement 09/22/2018, doing well, JP drain output yesterday 25 cc, bilious, today less than 5 cc, stay also complicated by mild aspiration pneumonia on Zosyn  Plan: 1.  Discussed with patient that his output has significantly decreased.  He will not need repeat ERCP.  Will discuss with Dr. Chales AbrahamsGupta 2.  Please await any further recommendations from Dr. Chales AbrahamsGupta later today, Dr. Elnoria HowardHung will resume care tomorrow.   LOS: 2 days   Unk LightningJennifer Lynne Lemmon  09/24/2018, 10:18 AM   Attending physician's note   I have taken an interval history, reviewed the chart and examined the patient. I agree with the Advanced Practitioner's note, impression and recommendations.   67 year old with acute gangrenous cholecystitis s/p lap  chole complicated by bile leak s/p failed ERCP due to periampullary diverticulum, rpt lap with sig bile aspiration, clipping of cystic stump leak, drain placement 09/22/2018.   Discussed with the family in detail.  The JP drainage is slowed down.  Very mildly bile tinged.  25 cc yesterday, today is less than 5 cc.  Continues to be on antibiotics.  Plan: HIDA scan in AM (family would like to get it done), monitor JP drain output, and  hold off on ERCP for tomorrow.  Dr. Elnoria Howard to resume care tomorrow  Edman Circle, MD

## 2018-09-25 ENCOUNTER — Ambulatory Visit (HOSPITAL_COMMUNITY)
Admission: RE | Admit: 2018-09-25 | Payer: BLUE CROSS/BLUE SHIELD | Source: Ambulatory Visit | Admitting: Gastroenterology

## 2018-09-25 ENCOUNTER — Encounter (HOSPITAL_COMMUNITY)
Admission: AD | Disposition: A | Discharge status: Home / Self Care | Payer: Self-pay | Source: Ambulatory Visit | Attending: General Surgery

## 2018-09-25 ENCOUNTER — Inpatient Hospital Stay (HOSPITAL_COMMUNITY): Payer: BLUE CROSS/BLUE SHIELD

## 2018-09-25 LAB — COMPREHENSIVE METABOLIC PANEL
ALT: 16 U/L (ref 0–44)
AST: 13 U/L — ABNORMAL LOW (ref 15–41)
Albumin: 2.7 g/dL — ABNORMAL LOW (ref 3.5–5.0)
Alkaline Phosphatase: 44 U/L (ref 38–126)
Anion gap: 8 (ref 5–15)
BUN: 8 mg/dL (ref 8–23)
CO2: 22 mmol/L (ref 22–32)
Calcium: 8.3 mg/dL — ABNORMAL LOW (ref 8.9–10.3)
Chloride: 110 mmol/L (ref 98–111)
Creatinine, Ser: 0.81 mg/dL (ref 0.61–1.24)
GFR calc Af Amer: >60 mL/min (ref >60)
GFR calc non Af Amer: >60 mL/min (ref >60)
Glucose, Bld: 110 mg/dL — ABNORMAL HIGH (ref 70–99)
Potassium: 3.5 mmol/L (ref 3.5–5.1)
Sodium: 140 mmol/L (ref 135–145)
Total Bilirubin: 0.6 mg/dL (ref 0.3–1.2)
Total Protein: 5.6 g/dL — ABNORMAL LOW (ref 6.5–8.1)

## 2018-09-25 LAB — CBC
HCT: 27.1 % — ABNORMAL LOW (ref 39.0–52.0)
Hemoglobin: 9.3 g/dL — ABNORMAL LOW (ref 13.0–17.0)
MCH: 33.7 pg (ref 26.0–34.0)
MCHC: 34.3 g/dL (ref 30.0–36.0)
MCV: 98.2 fL (ref 80.0–100.0)
Platelets: 209 10*3/uL (ref 150–400)
RBC: 2.76 MIL/uL — ABNORMAL LOW (ref 4.22–5.81)
RDW: 12.7 % (ref 11.5–15.5)
WBC: 8.5 10*3/uL (ref 4.0–10.5)
nRBC: 0 % (ref 0.0–0.2)

## 2018-09-25 LAB — GLUCOSE, CAPILLARY
Glucose-Capillary: 100 mg/dL — ABNORMAL HIGH (ref 70–99)
Glucose-Capillary: 107 mg/dL — ABNORMAL HIGH (ref 70–99)
Glucose-Capillary: 110 mg/dL — ABNORMAL HIGH (ref 70–99)
Glucose-Capillary: 112 mg/dL — ABNORMAL HIGH (ref 70–99)
Glucose-Capillary: 80 mg/dL (ref 70–99)
Glucose-Capillary: 99 mg/dL (ref 70–99)

## 2018-09-25 SURGERY — ERCP, WITH INTERVENTION IF INDICATED
Anesthesia: General

## 2018-09-25 MED ORDER — INSULIN ASPART 100 UNIT/ML ~~LOC~~ SOLN: 0.0000 [IU] | Freq: Three times a day (TID) | SUBCUTANEOUS | Status: DC

## 2018-09-25 MED ORDER — INSULIN GLARGINE 100 UNIT/ML ~~LOC~~ SOLN: 10.0000 [IU] | Freq: Every day | SUBCUTANEOUS | Status: DC

## 2018-09-25 MED ORDER — TECHNETIUM TC 99M MEBROFENIN IV KIT
5.4000 | PACK | Freq: Once | INTRAVENOUS | Status: AC | PRN
  Administered 2018-09-25: 5.4 via INTRAVENOUS

## 2018-09-25 NOTE — Progress Notes (Addendum)
4 Days Post-Op  Subjective: Alert.  Oriented.  No distress. No pain except around drain site. Ambulates to bathroom but not in hall Tolerating full liquids Stools loose but brownish color Afebrile with stable vital signs  JP thin drainage thin but bilious.  120 cc last 24 hours.  155 cc prior 24 hours.  Objective: Vital signs in last 24 hours: Temp:  [98.2 F (36.8 C)-99.1 F (37.3 C)] 99.1 F (37.3 C) (11/18 0425) Pulse Rate:  [74-77] 74 (11/18 0425) Resp:  [18-20] 20 (11/18 0425) BP: (130-133)/(73-77) 132/73 (11/18 0425) SpO2:  [92 %-95 %] 93 % (11/18 0425) Last BM Date: 09/24/18  Intake/Output from previous day: 11/17 0701 - 11/18 0700 In: 671.9 [P.O.:120; I.V.:300; IV Piggyback:251.9] Out: 1145 [Urine:1025; Drains:120] Intake/Output this shift: Total I/O In: 551.9 [I.V.:300; IV Piggyback:251.9] Out: 55 [Drains:55]  General appearance: Alert and comfortable.  Oriented with normal mental status does not appear in any distress Resp: Lungs reasonably clear at bases.  I do not hear any rhonchi or wheezes.  Deep breathing causes cough. GI: Abdomen soft.  Nondistended.  Wounds look good.  Only tenderness is around drain site.  Drainage is thin clear light bilious Extremities: extremities normal, atraumatic, no cyanosis or edema  Lab Results:  Results for orders placed or performed during the hospital encounter of 09/20/18 (from the past 24 hour(s))  Glucose, capillary     Status: Abnormal   Collection Time: 09/24/18  8:21 AM  Result Value Ref Range   Glucose-Capillary 105 (H) 70 - 99 mg/dL  Glucose, capillary     Status: Abnormal   Collection Time: 09/24/18 11:55 AM  Result Value Ref Range   Glucose-Capillary 113 (H) 70 - 99 mg/dL  Glucose, capillary     Status: None   Collection Time: 09/24/18  4:11 PM  Result Value Ref Range   Glucose-Capillary 93 70 - 99 mg/dL  Glucose, capillary     Status: Abnormal   Collection Time: 09/24/18  8:13 PM  Result Value Ref Range   Glucose-Capillary 122 (H) 70 - 99 mg/dL  Glucose, capillary     Status: Abnormal   Collection Time: 09/24/18 11:59 PM  Result Value Ref Range   Glucose-Capillary 107 (H) 70 - 99 mg/dL  Glucose, capillary     Status: Abnormal   Collection Time: 09/25/18  4:27 AM  Result Value Ref Range   Glucose-Capillary 110 (H) 70 - 99 mg/dL   Comment 1 Notify RN    Comment 2 Document in Chart      Studies/Results: Dg Chest 2 View  Result Date: 09/24/2018 CLINICAL DATA:  Shortness of breath EXAM: CHEST - 2 VIEW COMPARISON:  09/22/2018 FINDINGS: Cardiac shadow is mildly enlarged but stable. The lungs are well aerated bilaterally. Significant improved aeration is noted in the right mid lung. Some persistent infiltrate/atelectasis is noted in the bases bilaterally but also mildly improved. Small pleural effusions are noted bilaterally. No acute bony abnormality is seen. Catheter is noted in the upper abdomen on the lateral projection. IMPRESSION: Improved aeration with some persistent infiltrates in the bases as well as small effusions. Electronically Signed   By: Alcide Clever M.D.   On: 09/24/2018 08:52    . acetaminophen  1,000 mg Oral TID  . aspirin EC  81 mg Oral Daily  . FLUoxetine  40 mg Oral Daily  . glipiZIDE  2.5 mg Oral QAC breakfast  . insulin aspart  0-15 Units Subcutaneous Q4H  . insulin glargine  10 Units Subcutaneous  QHS  . lip balm  1 application Topical BID  . lisinopril  20 mg Oral Daily  . metFORMIN  1,000 mg Oral BID WC  . pantoprazole  40 mg Oral BID AC  . pravastatin  80 mg Oral Daily     Assessment/Plan: s/p Procedure(s): LAPAROSCOPY DIAGNOSTIC, WASHOUT, DRAIN PLACEMENT  Post cholecystectomy bile leak.--POD #4--laparoscopic washout, clipping of small tubular structure, placement of drains Patient has stabilized with resolution of sepsis and control of bile leak For hepatobiliary scan today. Bilious drainage continues although relatively low volume. The question now is  whether the bile leak will persist and we therefore need to proceed with the ERCP, or wait and see whether the drainage will stop spontaneously allowing eventual drain removal as outpatient  without further intervention. Await GI opinion. Check lab work this morning.  If WBC remains normal consider discontinue antibiotics  Gangrenous cholecystitis.  POD #5.  Laparoscopic cholecystectomy.  Severe chronic inflammation at time of surgery 09/20/18.   See pathology report  Type 2 diabetes without insulin    Excellent control.  [facility] and at bedtime coverage's.  Continue glucotrol; metformin; , lantus 10 units HS.  Hypertension, benign--currently BP 132/73.Marland Kitchen.on lisinopril and lopressor. BMI 31 Umbilical hernia, primary repair Anxiety-takes Prozac  @PROBHOSP @  LOS: 3 days    Ernestene MentionHaywood M Roseland Braun 09/25/2018  . .prob

## 2018-09-26 ENCOUNTER — Encounter (HOSPITAL_COMMUNITY): Payer: Self-pay | Admitting: General Surgery

## 2018-09-26 DIAGNOSIS — K838 Other specified diseases of biliary tract: Secondary | ICD-10-CM

## 2018-09-26 DIAGNOSIS — K9189 Other postprocedural complications and disorders of digestive system: Secondary | ICD-10-CM

## 2018-09-26 HISTORY — DX: Other specified diseases of biliary tract: K91.89

## 2018-09-26 HISTORY — DX: Other postprocedural complications and disorders of digestive system: K83.8

## 2018-09-26 LAB — GLUCOSE, CAPILLARY: Glucose-Capillary: 106 mg/dL — ABNORMAL HIGH (ref 70–99)

## 2018-09-26 NOTE — Discharge Summary (Addendum)
Patient ID: Chad Cox [id number] 67 y.o. [birth year]  Admit date: 09/20/2018  Discharge date and time: 09/26/2018  Admitting Physician: Ernestene Mention  Discharge Physician: Ernestene Mention  Admission Diagnoses: Chad Cox cholelithiasis  Discharge Diagnoses: Gangrenous cholecystitis                                          Postoperative bile leak         Duodenal diverticulum                                          Type 2 diabetes mellitus                                          Hypertension, benign                                          BMI 31                                          Anxiety                                          Kidney stones                                          Chronic GERD  Operations: Procedure(s):  1)   Laparoscopic cholecystectomy , primary repair umbilical hernia  - 09/20/2018 2)   Attempted ERCP, unsuccessful  -  09/21/2018 3)   LAPAROSCOPY DIAGNOSTIC, WASHOUT, DRAIN PLACEMENT  -  09/21/2018  Admission Condition: good  Discharged Condition: good  Indication for Admission: This is a pleasant, cooperative, 67 year old man, referred by Dr. Lockie Mola in the EDP for symptomatic gallstones. Chad Cox is his PCP. He says that he's been having right upper quadrant pain and nausea for a few years but thought it was due to reflux. He had an attack of right upper quadrant pain nausea and vomiting on Labor Day and went to the ER and was told he had gallstones. He was feeling better and chose not to do anything about it. He was in the ER on October 20, 3 days ago with R.U. quadrant pain nausea and vomiting which resolved. There is no aggravating or alleviating factors but sometimes it occurs after eating pizza. ER workup included CT scan which showed small umbilical hernia, gallstones, tiny kidney stone. Ultrasound was otherwise unremarkable. Glucose 329. WBC and LFTs normal. He is now ready to have something done about  this. He can still feel something in his right upper quadrant and he's lost his appetite. No fever or chills. No prior abdominal surgery I had a long discussion with him about gallbladder surgery. I think he has fairly classic symptoms and on exam it  is still tender in the right upper quadrant somewhat. We will schedule him for surgery urgently. He agrees He'll be scheduled for laparoscopic cholecystectomy with cholangiogram  Hospital Course: On the day of admission the patient underwent laparoscopic cholecystectomy.  The gallbladder was severely thickened and chronically inflamed and walled off between the liver and the omentum.  We were able to tease this out and see the anatomy very well and performed laparoscopic cholecystectomy.  The cystic duct appeared very tiny and could not be cannulated.  Cholangiogram was therefore not done.  Preop liver function tests were normal.  It appeared that everything was well clipped off.      Prior to discharge from PACU he developed moderately severe right upper quadrant pain and right shoulder pain.  He was admitted.  Lab work showed leukocytosis but normal liver function test and normal lipase.  Hepatobiliary scan showed bile leak.  Dr. Jeani HawkingPatrick Hung attempted ERCP the following day but was unsuccessful due to angulation from a giant duodenal diverticulum.  Patient continued to be ill with pain and was taken to the operating room in the evening of postop day 1 by Dr. Maisie Fushomas.  Washout of the bile was performed.  A clip was placed on a tiny tubular structure down near the cystic duct.  Drains were placed.  Antibiotics were started     He recovered without other incident but it took a few days for the ileus and pain to improve.  He had some thin bilious drainage each day but it was declining and so GI decided not to go back for ERCP.  The overall plan would be to anticipate spontaneous closure of the bile leak.      Pathology report showed gangrenous cholecystitis.   I shared this with the patient and his cousin.     His liver function test remain normal.  Leukocytosis resolved.  He resumed diet and ambulation and felt very good.  Hepatobiliary scan was performed prior to discharge and was read as showing no leak.  At the time of discharge his drainage was thin and bile tinged and so I suspect that he may have trace of a leak ongoing and so we left the drain in.      On the day of discharge the patient looked good.  He was alert and friendly.  Color was good.  Lungs were clear.  Abdomen was soft nondistended nontender.  Drainage was very thin and clear but with bile tinged color.       He was instructed in diet and activities and drain care.  He was instructed to take probiotics  twice daily.  He does not need antibiotics.  He states he was not having pain and declined prescription for pain medication.  He was instructed to continue his usual medications.  We talked about his diabetes I strongly advised him to see his PCP, Dr. Dallas Schimkeopeland, within 1 week.      He will return to see me in the office in 1 week.  We will see how the drainage is then.  We can remove the drain as an outpatient once the drainage resolves.  Consults: Gastroenterology  Significant Diagnostic Studies: Lab work.  Surgical pathology.  ERCP.  Hepatobiliary scan.  Treatments: surgery: Laparoscopic cholecystectomy, unsuccessful ERCP, laparoscopy with washout and clipping of bile leak and drainage  Disposition: Home  Patient Instructions:    Activity: activity as tolerated Diet: Low-fat diet Wound Care: as directed  Follow-up:  With Dr. Derrell LollingIngram in  1 week.  Signed: Angelia Mould. Derrell Lolling, M.D., FACS General and minimally invasive surgery Breast and Colorectal Surgery  09/26/2018, 6:47 AM

## 2018-09-26 NOTE — Progress Notes (Signed)
Patient instruction provided on JP drain, emptying and recording drainage to take to MD appointment. Patient verbalized understanding. Discharge instructions reviewed with patient. Questions answered and patient denies further questions. Patient's cousin is coming to drive him home. No prescriptions given to patient. Lina SarBeth Drevon Plog, RN

## 2019-01-11 ENCOUNTER — Encounter: Payer: Self-pay | Admitting: Family Medicine

## 2019-01-11 ENCOUNTER — Ambulatory Visit: Payer: BLUE CROSS/BLUE SHIELD | Admitting: Family Medicine

## 2019-01-11 ENCOUNTER — Emergency Department (HOSPITAL_COMMUNITY)
Admission: EM | Admit: 2019-01-11 | Discharge: 2019-01-11 | Disposition: A | Discharge status: Home / Self Care | Payer: BLUE CROSS/BLUE SHIELD | Attending: Emergency Medicine | Admitting: Emergency Medicine

## 2019-01-11 VITALS — BP 128/70 | HR 66 | Temp 97.8°F | Ht 72.0 in | Wt 229.2 lb

## 2019-01-11 DIAGNOSIS — Z79899 Other long term (current) drug therapy: Secondary | ICD-10-CM | POA: Insufficient documentation

## 2019-01-11 DIAGNOSIS — I10 Essential (primary) hypertension: Secondary | ICD-10-CM | POA: Insufficient documentation

## 2019-01-11 DIAGNOSIS — Z7984 Long term (current) use of oral hypoglycemic drugs: Secondary | ICD-10-CM | POA: Insufficient documentation

## 2019-01-11 DIAGNOSIS — E119 Type 2 diabetes mellitus without complications: Secondary | ICD-10-CM | POA: Diagnosis not present

## 2019-01-11 DIAGNOSIS — R319 Hematuria, unspecified: Secondary | ICD-10-CM | POA: Diagnosis not present

## 2019-01-11 DIAGNOSIS — R55 Syncope and collapse: Secondary | ICD-10-CM

## 2019-01-11 DIAGNOSIS — N23 Unspecified renal colic: Secondary | ICD-10-CM | POA: Insufficient documentation

## 2019-01-11 DIAGNOSIS — R457 State of emotional shock and stress, unspecified: Secondary | ICD-10-CM | POA: Diagnosis not present

## 2019-01-11 DIAGNOSIS — I959 Hypotension, unspecified: Secondary | ICD-10-CM | POA: Diagnosis not present

## 2019-01-11 DIAGNOSIS — Z7982 Long term (current) use of aspirin: Secondary | ICD-10-CM | POA: Insufficient documentation

## 2019-01-11 DIAGNOSIS — R42 Dizziness and giddiness: Secondary | ICD-10-CM | POA: Diagnosis not present

## 2019-01-11 DIAGNOSIS — R531 Weakness: Secondary | ICD-10-CM | POA: Diagnosis not present

## 2019-01-11 DIAGNOSIS — R4020 Unspecified coma: Secondary | ICD-10-CM | POA: Diagnosis not present

## 2019-01-11 LAB — POCT URINALYSIS DIPSTICK
Bilirubin, UA: NEGATIVE
Blood, UA: POSITIVE
Glucose, UA: NEGATIVE
Ketones, UA: NEGATIVE
Leukocytes, UA: NEGATIVE
Nitrite, UA: NEGATIVE
Protein, UA: NEGATIVE
Spec Grav, UA: 1.01 (ref 1.010–1.025)
Urobilinogen, UA: 0.2 E.U./dL
pH, UA: 7.5 (ref 5.0–8.0)

## 2019-01-11 LAB — CBC
HCT: 37.6 % — ABNORMAL LOW (ref 39.0–52.0)
HCT: 37.2 % — ABNORMAL LOW (ref 39.0–52.0)
Hemoglobin: 13.3 g/dL (ref 13.0–17.0)
Hemoglobin: 12.3 g/dL — ABNORMAL LOW (ref 13.0–17.0)
MCH: 33.2 pg (ref 26.0–34.0)
MCHC: 35.5 g/dL (ref 30.0–36.0)
MCHC: 33.1 g/dL (ref 30.0–36.0)
MCV: 95.1 fl (ref 78.0–100.0)
MCV: 100.5 fL — ABNORMAL HIGH (ref 80.0–100.0)
Platelets: 264 10*3/uL (ref 150.0–400.0)
Platelets: 222 10*3/uL (ref 150–400)
RBC: 3.95 Mil/uL — ABNORMAL LOW (ref 4.22–5.81)
RBC: 3.7 MIL/uL — ABNORMAL LOW (ref 4.22–5.81)
RDW: 13.1 % (ref 11.5–15.5)
RDW: 12.8 % (ref 11.5–15.5)
WBC: 11.7 10*3/uL — ABNORMAL HIGH (ref 4.0–10.5)
WBC: 17.8 10*3/uL — ABNORMAL HIGH (ref 4.0–10.5)
nRBC: 0 % (ref 0.0–0.2)

## 2019-01-11 LAB — URINALYSIS, ROUTINE W REFLEX MICROSCOPIC
Bilirubin Urine: NEGATIVE
Ketones, ur: NEGATIVE
Leukocytes,Ua: NEGATIVE
Nitrite: NEGATIVE
Specific Gravity, Urine: 1.01 (ref 1.000–1.030)
Total Protein, Urine: NEGATIVE
Urine Glucose: NEGATIVE
Urobilinogen, UA: 0.2 (ref 0.0–1.0)
pH: 7.5 (ref 5.0–8.0)

## 2019-01-11 LAB — BASIC METABOLIC PANEL
Anion gap: 10 (ref 5–15)
BUN: 13 mg/dL (ref 8–23)
CO2: 24 mmol/L (ref 22–32)
Calcium: 8.8 mg/dL — ABNORMAL LOW (ref 8.9–10.3)
Chloride: 106 mmol/L (ref 98–111)
Creatinine, Ser: 1.17 mg/dL (ref 0.61–1.24)
GFR calc Af Amer: >60 mL/min (ref >60)
GFR calc non Af Amer: >60 mL/min (ref >60)
Glucose, Bld: 196 mg/dL — ABNORMAL HIGH (ref 70–99)
Potassium: 3.9 mmol/L (ref 3.5–5.1)
Sodium: 140 mmol/L (ref 135–145)

## 2019-01-11 LAB — I-STAT TROPONIN, ED: Troponin i, poc: 0 ng/mL (ref 0.00–0.08)

## 2019-01-11 MED ORDER — KETOROLAC TROMETHAMINE 10 MG PO TABS: 10.0000 mg | ORAL_TABLET | Freq: Four times a day (QID) | ORAL | 0 refills | Status: DC | PRN

## 2019-01-11 MED ORDER — SODIUM CHLORIDE 0.9% FLUSH
3.0000 mL | Freq: Once | INTRAVENOUS | Status: AC
  Administered 2019-01-11: 3 mL via INTRAVENOUS

## 2019-01-11 MED ORDER — HYDROCODONE-ACETAMINOPHEN 5-325 MG PO TABS: 1.0000 | ORAL_TABLET | Freq: Four times a day (QID) | ORAL | 0 refills | Status: DC | PRN

## 2019-01-11 MED ORDER — SODIUM CHLORIDE 0.9 % IV BOLUS
500.0000 mL | Freq: Once | INTRAVENOUS | Status: AC
  Administered 2019-01-11: 500 mL via INTRAVENOUS

## 2019-01-11 MED ORDER — TAMSULOSIN HCL 0.4 MG PO CAPS: 0.4000 mg | ORAL_CAPSULE | Freq: Every day | ORAL | 0 refills | Status: DC

## 2019-01-11 NOTE — Discharge Instructions (Addendum)
Please return for any problem.  Follow-up with your regular care provider as instructed.  Always warn the phlebotomist that you may pass out prior to a blood draw.  If you have recurrent symptoms it is important to lie down on the floor so that you do not pass out completely.

## 2019-01-11 NOTE — ED Notes (Signed)
Bed: ES92 Expected date:  Expected time:  Means of arrival:  Comments: 68yo syncopal episode from PCP

## 2019-01-11 NOTE — Patient Instructions (Signed)
Hematuria, Adult  Hematuria is blood in the urine. Blood may be visible in the urine, or it may be identified with a test. This condition can be caused by infections of the bladder, urethra, kidney, or prostate. Other possible causes include:   Kidney stones.   Cancer of the urinary tract.   Too much calcium in the urine.   Conditions that are passed from parent to child (inherited conditions).   Exercise that requires a lot of energy.  Infections can usually be treated with medicine, and a kidney stone usually will pass through your urine. If neither of these is the cause of your hematuria, more tests may be needed to identify the cause of your symptoms.  It is very important to tell your health care provider about any blood in your urine, even if it is painless or the blood stops without treatment. Blood in the urine, when it happens and then stops and then happens again, can be a symptom of a very serious condition, including cancer. There is no pain in the initial stages of many urinary cancers.  Follow these instructions at home:  Medicines   Take over-the-counter and prescription medicines only as told by your health care provider.   If you were prescribed an antibiotic medicine, take it as told by your health care provider. Do not stop taking the antibiotic even if you start to feel better.  Eating and drinking   Drink enough fluid to keep your urine clear or pale yellow. It is recommended that you drink 3-4 quarts (2.8-3.8 L) a day. If you have been diagnosed with an infection, it is recommended that you drink cranberry juice in addition to large amounts of water.   Avoid caffeine, tea, and carbonated beverages. These tend to irritate the bladder.   Avoid alcohol because it may irritate the prostate (men).  General instructions   If you have been diagnosed with a kidney stone, follow your health care provider's instructions about straining your urine to catch the stone.   Empty your bladder  often. Avoid holding urine for long periods of time.   If you are male:  ? After a bowel movement, wipe from front to back and use each piece of toilet paper only once.  ? Empty your bladder before and after sex.   Pay attention to any changes in your symptoms. Tell your health care provider about any changes or any new symptoms.   It is your responsibility to get your test results. Ask your health care provider, or the department performing the test, when your results will be ready.   Keep all follow-up visits as told by your health care provider. This is important.  Contact a health care provider if:   You develop back pain.   You have a fever.   You have nausea or vomiting.   Your symptoms do not improve after 3 days.   Your symptoms get worse.  Get help right away if:   You develop severe vomiting and are unable take medicine without vomiting.   You develop severe pain in your back or abdomen even though you are taking medicine.   You pass a large amount of blood in your urine.   You pass blood clots in your urine.   You feel very weak or like you might faint.   You faint.  Summary   Hematuria is blood in the urine. It has many possible causes.   It is very important that you tell   your health care provider about any blood in your urine, even if it is painless or the blood stops without treatment.   Take over-the-counter and prescription medicines only as told by your health care provider.   Drink enough fluid to keep your urine clear or pale yellow.  This information is not intended to replace advice given to you by your health care provider. Make sure you discuss any questions you have with your health care provider.  Document Released: 10/25/2005 Document Revised: 11/27/2016 Document Reviewed: 11/27/2016  [employer] Interactive Patient Education  2019 [employer]

## 2019-01-11 NOTE — Progress Notes (Addendum)
Established Patient Office Visit  Subjective:  Patient ID: Chad Cox, male    DOB: Apr 18, 1951  Age: 68 y.o. MRN: [id number]  CC:  Chief Complaint  Patient presents with  . Back Pain    HPI Chad Cox presents for evaluation treatment of a 3 to 4-day history of right flank pain associated with urinary frequency, nocturia and some urgency.  There is been no fever chills nausea and vomiting.  There has been some radiation of pain into the groin.  Patient has no history of prostatitis.  Status post cholecystectomy in November.  Imaging work-up at that time did show 4 mm stone in the renal pelvis of his right kidney.  He does have a history of renal lithiasis.  Scheduled to see his primary doctor Wednesday. Past Medical History:  Diagnosis Date  . Bile leak, postoperative 09/26/2018  . Cholecystitis   . Diabetes mellitus without complication (HCC)   . Gallstones and inflammation of gallbladder without obstruction 09/20/2018  . Hypertension   . Kidney stone     Past Surgical History:  Procedure Laterality Date  . CHOLECYSTECTOMY N/A 09/20/2018   Procedure: LAPAROSCOPIC CHOLECYSTECTOMY WITH PRIMARY UMBILICAL HERNIA REPAIR;  Surgeon: Claud Kelp, MD;  Location: [facility];  Service: General;  Laterality: N/A;  . ERCP N/A 09/21/2018   Procedure: ENDOSCOPIC RETROGRADE CHOLANGIOPANCREATOGRAPHY (ERCP);  Surgeon: Jeani Hawking, MD;  Location: [facility];  Service: Endoscopy;  Laterality: N/A;  . LAPAROSCOPY N/A 09/21/2018   Procedure: LAPAROSCOPY DIAGNOSTIC, WASHOUT, DRAIN PLACEMENT;  Surgeon: Romie Levee, MD;  Location: [facility];  Service: General;  Laterality: N/A;  . WISDOM TOOTH EXTRACTION      Family History  Problem Relation Age of Onset  . Atrial fibrillation Mother     Social History   Socioeconomic History  . Marital status: Single    Spouse name: Not on file  . Number of children: Not on file  . Years of education: Not on file  . Highest education level: Not  on file  Occupational History  . Not on file  Social Needs  . Financial resource strain: Not on file  . Food insecurity:    Worry: Not on file    Inability: Not on file  . Transportation needs:    Medical: Not on file    Non-medical: Not on file  Tobacco Use  . Smoking status: Never Smoker  . Smokeless tobacco: Never Used  Substance and Sexual Activity  . Alcohol use: Yes    Comment: occ  . Drug use: No  . Sexual activity: Never  Lifestyle  . Physical activity:    Days per week: Not on file    Minutes per session: Not on file  . Stress: Not on file  Relationships  . Social connections:    Talks on phone: Not on file    Gets together: Not on file    Attends religious service: Not on file    Active member of club or organization: Not on file    Attends meetings of clubs or organizations: Not on file    Relationship status: Not on file  . Intimate partner violence:    Fear of current or ex partner: Not on file    Emotionally abused: Not on file    Physically abused: Not on file    Forced sexual activity: Not on file  Other Topics Concern  . Not on file  Social History Narrative  . Not on file    Outpatient Medications Prior to  Visit  Medication Sig Dispense Refill  . acetaminophen (TYLENOL) 650 MG CR tablet Take 650 mg by mouth at bedtime.     Marland Kitchen aspirin 81 MG tablet Take 81 mg by mouth daily.    . calcium carbonate (TUMS - DOSED IN MG ELEMENTAL CALCIUM) 500 MG chewable tablet Chew 1 tablet by mouth every 8 (eight) hours as needed for indigestion or heartburn.    . cimetidine (TAGAMET) 200 MG tablet Take 200 mg by mouth every 8 (eight) hours as needed (acid reflux).    . cyclobenzaprine (FLEXERIL) 5 MG tablet Take 1 tablet (5 mg total) by mouth daily as needed for muscle spasms. 30 tablet 0  . diphenhydrAMINE (BENADRYL) 25 mg capsule Take 25 mg by mouth at bedtime.    Marland Kitchen FLUoxetine (PROZAC) 20 MG tablet Take 2 tablets (40 mg total) by mouth daily. (Patient taking  differently: Take 20 mg by mouth daily. ) 60 tablet 3  . glipiZIDE (GLUCOTROL) 5 MG tablet Take 0.5 tablets (2.5 mg total) by mouth daily before breakfast. 30 tablet 6  . lisinopril (PRINIVIL,ZESTRIL) 20 MG tablet Take 1 tablet (20 mg total) by mouth daily. 90 tablet 3  . metFORMIN (GLUCOPHAGE) 500 MG tablet Take 2 pills twice daily (Patient taking differently: Take 1,000 mg by mouth 2 (two) times daily with a meal. ) 360 tablet 3  . Multiple Vitamin (MULTIVITAMIN) tablet Take 1 tablet by mouth daily.    . naproxen sodium (ANAPROX) 220 MG tablet Take 2 tablets (440 mg total) by mouth 2 (two) times daily as needed (pain). 60 tablet 2  . pantoprazole (PROTONIX) 40 MG tablet TAKE 1 TABLET(40 MG) BY MOUTH DAILY (Patient taking differently: Take 40 mg by mouth daily as needed (acid reflux). ) 30 tablet 5  . pravastatin (PRAVACHOL) 80 MG tablet Take 1 by mouth daily (Patient taking differently: Take 80 mg by mouth daily. ) 90 tablet 3  . Red Yeast Rice Extract (RED YEAST RICE PO) Take 1-2 capsules by mouth daily.    Marland Kitchen HYDROcodone-acetaminophen (NORCO) 5-325 MG tablet Take 1-2 tablets by mouth every 6 (six) hours as needed for moderate pain or severe pain. 20 tablet 0  . HYDROcodone-acetaminophen (NORCO/VICODIN) 5-325 MG tablet Take 1 tablet by mouth every 4 (four) hours as needed for moderate pain. (Patient not taking: Reported on 09/18/2018) 15 tablet 0  . HYDROcodone-acetaminophen (NORCO/VICODIN) 5-325 MG tablet Take 1 tablet by mouth every 6 (six) hours as needed for up to 12 doses for moderate pain. 10 tablet 0  . ondansetron (ZOFRAN ODT) 8 MG disintegrating tablet Take 1 tablet (8 mg total) by mouth every 8 (eight) hours as needed for nausea or vomiting. (Patient not taking: Reported on 09/18/2018) 20 tablet 0  . sulfamethoxazole-trimethoprim (BACTRIM DS,SEPTRA DS) 800-160 MG tablet Take 1 tablet by mouth 2 (two) times daily.   0   No facility-administered medications prior to visit.     Allergies    Allergen Reactions  . Adhesive [Tape] Other (See Comments)    "acts like poison ivy" blisters up, itching  . Betadine [Povidone Iodine] Other (See Comments)    Blisters, itching    ROS Review of Systems  Constitutional: Negative for chills, diaphoresis, fatigue, fever and unexpected weight change.  Respiratory: Negative.   Cardiovascular: Negative.   Gastrointestinal: Negative for abdominal pain, anal bleeding, blood in stool, nausea and vomiting.  Genitourinary: Positive for flank pain, frequency and urgency. Negative for decreased urine volume, difficulty urinating, dysuria and hematuria.  Musculoskeletal:  Positive for back pain.  Skin: Negative for rash.  Allergic/Immunologic: Negative for immunocompromised state.  Neurological: Negative.   Hematological: Negative.   Psychiatric/Behavioral: Negative.       Objective:    Physical Exam  Constitutional: He is oriented to person, place, and time. He appears well-developed and well-nourished. No distress.  HENT:  Head: Normocephalic and atraumatic.  Right Ear: External ear normal.  Left Ear: External ear normal.  Eyes: Conjunctivae are normal. Right eye exhibits no discharge. Left eye exhibits no discharge. No scleral icterus.  Neck: No JVD present. No tracheal deviation present.  Cardiovascular: Normal rate, regular rhythm and normal heart sounds.  Pulmonary/Chest: Effort normal and breath sounds normal. No stridor.  Abdominal: Bowel sounds are normal. He exhibits no distension. There is no abdominal tenderness. There is no rebound, no guarding and no CVA tenderness.  Genitourinary: Rectum:     Guaiac result negative.     No rectal mass, anal fissure, tenderness, external hemorrhoid, internal hemorrhoid or abnormal anal tone.  Prostate is not enlarged and not tender.  Musculoskeletal:     Lumbar back: He exhibits normal range of motion, no tenderness and no bony tenderness.  Neurological: He is alert and oriented to person,  place, and time.  Skin: Skin is warm and dry. He is not diaphoretic.  Psychiatric: He has a normal mood and affect. His behavior is normal.    BP 128/70   Pulse 66   Temp 97.8 F (36.6 C) (Oral)   Ht 6' (1.829 m)   Wt 229 lb 4 oz (104 kg)   SpO2 98%   BMI 31.09 kg/m  Wt Readings from Last 3 Encounters:  01/11/19 229 lb 4 oz (104 kg)  09/21/18 236 lb 5.3 oz (107.2 kg)  09/04/18 229 lb (103.9 kg)   BP Readings from Last 3 Encounters:  01/11/19 128/70  09/26/18 129/86  09/18/18 117/73   Guideline developer:  UpToDate (see UpToDate for funding source) Date Released: June 2014  There are no preventive care reminders to display for this patient.  There are no preventive care reminders to display for this patient.  No results found for: TSH Lab Results  Component Value Date   WBC 8.5 09/25/2018   HGB 9.3 (L) 09/25/2018   HCT 27.1 (L) 09/25/2018   MCV 98.2 09/25/2018   PLT 209 09/25/2018   Lab Results  Component Value Date   NA 140 09/25/2018   K 3.5 09/25/2018   CO2 22 09/25/2018   GLUCOSE 110 (H) 09/25/2018   BUN 8 09/25/2018   CREATININE 0.81 09/25/2018   BILITOT 0.6 09/25/2018   ALKPHOS 44 09/25/2018   AST 13 (L) 09/25/2018   ALT 16 09/25/2018   PROT 5.6 (L) 09/25/2018   ALBUMIN 2.7 (L) 09/25/2018   CALCIUM 8.3 (L) 09/25/2018   ANIONGAP 8 09/25/2018   GFR 78.43 05/29/2018   Lab Results  Component Value Date   CHOL 189 05/29/2018   Lab Results  Component Value Date   HDL 38.20 (L) 05/29/2018   Lab Results  Component Value Date   LDLCALC 123 (H) 05/29/2018   Lab Results  Component Value Date   TRIG 138.0 05/29/2018   Lab Results  Component Value Date   CHOLHDL 5 05/29/2018   Lab Results  Component Value Date   HGBA1C 8.1 (H) 09/04/2018      Assessment & Plan:   Problem List Items Addressed This Visit      Other   Kidney pain -  Primary   Relevant Orders   POCT urinalysis dipstick (Completed)   Hematuria   Relevant Medications    ketorolac (TORADOL) 10 MG tablet   tamsulosin (FLOMAX) 0.4 MG CAPS capsule   HYDROcodone-acetaminophen (NORCO/VICODIN) 5-325 MG tablet   Other Relevant Orders   CBC   Urinalysis, Routine w reflex microscopic   Urine Culture      Meds ordered this encounter  Medications  . ketorolac (TORADOL) 10 MG tablet    Sig: Take 1 tablet (10 mg total) by mouth every 6 (six) hours as needed.    Dispense:  20 tablet    Refill:  0  . tamsulosin (FLOMAX) 0.4 MG CAPS capsule    Sig: Take 1 capsule (0.4 mg total) by mouth daily.    Dispense:  14 capsule    Refill:  0  . HYDROcodone-acetaminophen (NORCO/VICODIN) 5-325 MG tablet    Sig: Take 1 tablet by mouth every 6 (six) hours as needed for moderate pain.    Dispense:  20 tablet    Refill:  0    Follow-up: Return in about 5 days (around 01/16/2019), or if symptoms worsen or fail to improve.   Addendum: Summoned to the laboratory after patient become unresponsive after his blood draw.   Patient was found to be pale, diaphoretic and unresponsive.  Carotid pulse was palpated.  He was breathing spontaneously but not would not respond to my voice.  I called for the AED and applied the pads.  AED recommended CPR.  I did 30 chest compressions and noted that the patient continue to breathe spontaneously and a palpable pulse was palpated.  He was still unresponsive.  I started CPR again and patient asked me to stop.  I did.  Patient still remained mostly unresponsive.  EMS arrived.  Blood pressure was found to be 84/44.  EKG was essentially normal.  Patient was moved to the stretcher and taken to the emergency room.

## 2019-01-11 NOTE — ED Provider Notes (Addendum)
[facility]-EMERGENCY DEPT Provider Note   CSN: [id number] Arrival date & time: 01/11/19  1550    History   Chief Complaint Chief Complaint  Patient presents with  . Loss of Consciousness    HPI Chad Cox is a 68 y.o. male.     68 year old male with prior medical history as detailed below presents for evaluation following syncopal event.  Patient was at his primary care provider's office.  He had just had a blood draw.  Within a minute or so of the blood draw he felt lightheaded, weak, and nearly passed out.  Reportedly the staff at this facility kept him upright in the blood draw chair.  Apparently, the patient was given a brief number of CPR like compressions while still upright in his chair.  Patient reports that he had an AED placed by the staff which instructed them to start compressions. Patient recalls the entire event - reportedly, he received "30 compressions."  Patient reports a longstanding prior history of vasovagal reactions secondary to blood draws.  He reports that he is typically able to look away from the blood draw and is able to control his symptoms.  He denies associated chest pain or shortness of breath.  He feels significantly improved upon arrival to the ED.  The history is provided by the patient and medical records.  Near Syncope  This is a new problem. The current episode started 1 to 2 hours ago. The problem occurs constantly. The problem has not changed since onset.Pertinent negatives include no chest pain, no abdominal pain, no headaches and no shortness of breath. Nothing aggravates the symptoms. Nothing relieves the symptoms.    Past Medical History:  Diagnosis Date  . Bile leak, postoperative 09/26/2018  . Cholecystitis   . Diabetes mellitus without complication (HCC)   . Gallstones and inflammation of gallbladder without obstruction 09/20/2018  . Hypertension   . Kidney stone     Patient Active Problem List   Diagnosis  Date Noted  . Kidney pain 01/11/2019  . Hematuria 01/11/2019  . Bile leak, postoperative 09/26/2018  . Gallstones and inflammation of gallbladder without obstruction 09/20/2018  . Cholelithiasis and cholecystitis without obstruction 09/20/2018  . Obesity, unspecified 04/29/2014  . Diabetes mellitus 01/30/2012  . Hypertension 01/30/2012  . Dyslipidemia 01/30/2012    Past Surgical History:  Procedure Laterality Date  . CHOLECYSTECTOMY N/A 09/20/2018   Procedure: LAPAROSCOPIC CHOLECYSTECTOMY WITH PRIMARY UMBILICAL HERNIA REPAIR;  Surgeon: Claud Kelp, MD;  Location: [facility];  Service: General;  Laterality: N/A;  . ERCP N/A 09/21/2018   Procedure: ENDOSCOPIC RETROGRADE CHOLANGIOPANCREATOGRAPHY (ERCP);  Surgeon: Jeani Hawking, MD;  Location: [facility];  Service: Endoscopy;  Laterality: N/A;  . LAPAROSCOPY N/A 09/21/2018   Procedure: LAPAROSCOPY DIAGNOSTIC, WASHOUT, DRAIN PLACEMENT;  Surgeon: Romie Levee, MD;  Location: [facility];  Service: General;  Laterality: N/A;  . WISDOM TOOTH EXTRACTION          Home Medications    Prior to Admission medications   Medication Sig Start Date End Date Taking? Authorizing Provider  acetaminophen (TYLENOL) 650 MG CR tablet Take 650 mg by mouth at bedtime.     [provider]  aspirin 81 MG tablet Take 81 mg by mouth daily.    [provider]  calcium carbonate (TUMS - DOSED IN MG ELEMENTAL CALCIUM) 500 MG chewable tablet Chew 1 tablet by mouth every 8 (eight) hours as needed for indigestion or heartburn.    [provider]  cimetidine (TAGAMET) 200  MG tablet Take 200 mg by mouth every 8 (eight) hours as needed (acid reflux).    [provider]  cyclobenzaprine (FLEXERIL) 5 MG tablet Take 1 tablet (5 mg total) by mouth daily as needed for muscle spasms. 07/11/18   Copland, Gwenlyn FoundJessica C, MD  diphenhydrAMINE (BENADRYL) 25 mg capsule Take 25 mg by mouth at bedtime.    [provider]  FLUoxetine (PROZAC) 20 MG  tablet Take 2 tablets (40 mg total) by mouth daily. Patient taking differently: Take 20 mg by mouth daily.  05/29/18   Copland, Gwenlyn FoundJessica C, MD  glipiZIDE (GLUCOTROL) 5 MG tablet Take 0.5 tablets (2.5 mg total) by mouth daily before breakfast. 09/04/18   Copland, Gwenlyn FoundJessica C, MD  HYDROcodone-acetaminophen (NORCO/VICODIN) 5-325 MG tablet Take 1 tablet by mouth every 6 (six) hours as needed for moderate pain. 01/11/19   Mliss SaxKremer, William Alfred, MD  ketorolac (TORADOL) 10 MG tablet Take 1 tablet (10 mg total) by mouth every 6 (six) hours as needed. 01/11/19   Mliss SaxKremer, William Alfred, MD  lisinopril (PRINIVIL,ZESTRIL) 20 MG tablet Take 1 tablet (20 mg total) by mouth daily. 05/29/18   Copland, Gwenlyn FoundJessica C, MD  metFORMIN (GLUCOPHAGE) 500 MG tablet Take 2 pills twice daily Patient taking differently: Take 1,000 mg by mouth 2 (two) times daily with a meal.  05/30/18   Copland, Gwenlyn FoundJessica C, MD  Multiple Vitamin (MULTIVITAMIN) tablet Take 1 tablet by mouth daily.    [provider]  naproxen sodium (ANAPROX) 220 MG tablet Take 2 tablets (440 mg total) by mouth 2 (two) times daily as needed (pain). 03/30/17   Copland, Gwenlyn FoundJessica C, MD  pantoprazole (PROTONIX) 40 MG tablet TAKE 1 TABLET(40 MG) BY MOUTH DAILY Patient taking differently: Take 40 mg by mouth daily as needed (acid reflux).  06/26/18   Copland, Gwenlyn FoundJessica C, MD  pravastatin (PRAVACHOL) 80 MG tablet Take 1 by mouth daily Patient taking differently: Take 80 mg by mouth daily.  05/30/18   Copland, Gwenlyn FoundJessica C, MD  Red Yeast Rice Extract (RED YEAST RICE PO) Take 1-2 capsules by mouth daily.    [provider]  tamsulosin (FLOMAX) 0.4 MG CAPS capsule Take 1 capsule (0.4 mg total) by mouth daily. 01/11/19   Mliss SaxKremer, William Alfred, MD    Family History Family History  Problem Relation Age of Onset  . Atrial fibrillation Mother     Social History Social History   Tobacco Use  . Smoking status: Never Smoker  . Smokeless tobacco: Never Used  Substance Use  Topics  . Alcohol use: Yes    Comment: occ  . Drug use: No     Allergies   Adhesive [tape] and Betadine [povidone iodine]   Review of Systems Review of Systems  Respiratory: Negative for shortness of breath.   Cardiovascular: Positive for near-syncope. Negative for chest pain.  Gastrointestinal: Negative for abdominal pain.  Neurological: Negative for headaches.  All other systems reviewed and are negative.    Physical Exam Updated Vital Signs There were no vitals taken for this visit.  Physical Exam Vitals signs and nursing note reviewed.  Constitutional:      General: He is not in acute distress.    Appearance: Normal appearance. He is well-developed.  HENT:     Head: Normocephalic and atraumatic.  Eyes:     Conjunctiva/sclera: Conjunctivae normal.     Pupils: Pupils are equal, round, and reactive to light.  Neck:     Musculoskeletal: Normal range of motion and neck supple.  Cardiovascular:     Rate and Rhythm: Normal rate and regular rhythm.     Heart sounds: Normal heart sounds.  Pulmonary:     Effort: Pulmonary effort is normal. No respiratory distress.     Breath sounds: Normal breath sounds.  Abdominal:     General: There is no distension.     Palpations: Abdomen is soft.     Tenderness: There is no abdominal tenderness.  Musculoskeletal: Normal range of motion.        General: No deformity.  Skin:    General: Skin is warm and dry.  Neurological:     General: No focal deficit present.     Mental Status: He is alert and oriented to person, place, and time. Mental status is at baseline.     Cranial Nerves: No cranial nerve deficit.     Sensory: No sensory deficit.     Motor: No weakness.     Coordination: Coordination normal.      ED Treatments / Results  Labs (all labs ordered are listed, but only abnormal results are displayed) Labs Reviewed  BASIC METABOLIC PANEL - Abnormal; Notable for the following components:      Result Value   Glucose,  Bld 196 (*)    Calcium 8.8 (*)    All other components within normal limits  CBC - Abnormal; Notable for the following components:   WBC 17.8 (*)    RBC 3.70 (*)    Hemoglobin 12.3 (*)    HCT 37.2 (*)    MCV 100.5 (*)    All other components within normal limits  URINALYSIS, ROUTINE W REFLEX MICROSCOPIC  I-STAT TROPONIN, ED    EKG EKG Interpretation  Date/Time:  Thursday January 11 2019 16:07:49 EST Ventricular Rate:  73 PR Interval:    QRS Duration: 91 QT Interval:  392 QTC Calculation: 432 R Axis:   47 Text Interpretation:  Sinus rhythm Short PR interval Confirmed by Kristine Royal [phone]) on 01/11/2019 5:12:35 PM   Radiology No results found.  Procedures Procedures (including critical care time)  Medications Ordered in ED Medications  sodium chloride 0.9 % bolus 500 mL (has no administration in time range)  sodium chloride flush (NS) 0.9 % injection 3 mL (3 mLs Intravenous Given 01/11/19 1623)     Initial Impression / Assessment and Plan / ED Course  I have reviewed the triage vital signs and the nursing notes.  Pertinent labs & imaging results that were available during my care of the patient were reviewed by me and considered in my medical decision making (see chart for details).        MDM  Screen complete  Patient is presenting for evaluation today following near syncopal event.  Patient's presentation is entirely consistent with likely vasovagal reaction secondary to blood draw.  Patient with longstanding history of similar prior episodes of vasovagal syncope after blood draws.  Patient is asymptomatic upon his ED evaluation.  Screening labs obtained are without significant abnormality.  Following his ED evaluation and administration of IV fluids the patient desires discharge home.  He declines further work-up or further observation.  Strict return precautions given and understood.  Importance of close follow-up is stressed.   Final Clinical  Impressions(s) / ED Diagnoses   Final diagnoses:  Vasovagal syncope    ED Discharge Orders    None       Wynetta Fines, MD 01/11/19 Mikle Bosworth    Wynetta Fines, MD 01/11/19 1902

## 2019-01-11 NOTE — Addendum Note (Signed)
Addended by: Andrez Grime on: 01/11/2019 05:09 PM   Modules accepted: Level of Service

## 2019-01-11 NOTE — ED Triage Notes (Signed)
Transported by [facility] from doctor's office-- witnessed syncopal episode after getting blood drawn at office. Lasted approximately 10-15 (in and out of consciousness), MD performed compressions and placed AED on patient. +pale/weakness. Initial BP 84/43. Most recent 126/69 after 750 cc of NS administered by EMS. AAO x4. Denies nausea, shob or chest pain.

## 2019-01-12 LAB — URINE CULTURE
MICRO NUMBER:: 281471
Result:: NO GROWTH
SPECIMEN QUALITY:: ADEQUATE

## 2019-01-16 NOTE — Progress Notes (Addendum)
[facility] [address] [place], [place] [address] [phone]  Date:  01/17/2019   Name:  Chad Cox   DOB:  02/05/1951   MRN:  [id number]  PCP:  Pearline Cables, MD    Chief Complaint: Diabetes (A1C)   History of Present Illness:  Chad Cox is a 68 y.o. very pleasant male patient who presents with the following:  Chad Cox is here today for recheck on diabetes.  His A1c has been well controlled, until July of last year when it suddenly increased to over 9%. Lab Results  Component Value Date   HGBA1C 8.1 (H) 09/04/2018   At that time I had him increase his metformin dose A1c shows some improvement in October, see above.  We added glipizide but he admits that he is not actually taking this We will check on his progress today  Flu shot- he declines today  Health maintenance is otherwise up-to-date  He was also seen at primary care on 3/5, with concern of possible kidney stone He still notes a feeling of urinary frequency, although improved the last couple of days No dysuria  He had a UA and negative urine culture on 3/5 He thinks that the stone likely passed into his bladder, but does not think he is actually voided it out yet He is taking flomax and toradol prn, he has not really needed the Vicodin he was given He was given Toradol, Flomax, Vicodin at his kidney stone visit.  However he syncopized during his blood draw, and actually received CPR.  EMS arrived and transported him to the emergency room ER evaluation was consistent with vasovagal syncope, he was released to home after IV fluids He has had syncope with blood draw in the past but never so severe   No significant leukocytosis from the ER, but this may have been due to stress.  Will repeat today  Aspirin 81 Prozac 40 mg daily Glipizide 2.5 mg Lisinopril 20 Metformin one thousand twice a day Protonix Pravastatin  Admits that he eats a  lot of processed foods .  He would like to see a nutritionist  - this is certainly fine  He lives near [place], will research nutritionist in that area and let me know who he would like to see Patient Active Problem List   Diagnosis Date Noted  . Kidney pain 01/11/2019  . Hematuria 01/11/2019  . Bile leak, postoperative 09/26/2018  . Gallstones and inflammation of gallbladder without obstruction 09/20/2018  . Cholelithiasis and cholecystitis without obstruction 09/20/2018  . Obesity, unspecified 04/29/2014  . Diabetes mellitus 01/30/2012  . Hypertension 01/30/2012  . Dyslipidemia 01/30/2012    Past Medical History:  Diagnosis Date  . Bile leak, postoperative 09/26/2018  . Cholecystitis   . Diabetes mellitus without complication (HCC)   . Gallstones and inflammation of gallbladder without obstruction 09/20/2018  . Hypertension   . Kidney stone     Past Surgical History:  Procedure Laterality Date  . CHOLECYSTECTOMY N/A 09/20/2018   Procedure: LAPAROSCOPIC CHOLECYSTECTOMY WITH PRIMARY UMBILICAL HERNIA REPAIR;  Surgeon: Claud Kelp, MD;  Location: [facility];  Service: General;  Laterality: N/A;  . ERCP N/A 09/21/2018   Procedure: ENDOSCOPIC RETROGRADE CHOLANGIOPANCREATOGRAPHY (ERCP);  Surgeon: Jeani Hawking, MD;  Location: [facility];  Service: Endoscopy;  Laterality: N/A;  . LAPAROSCOPY N/A 09/21/2018   Procedure: LAPAROSCOPY DIAGNOSTIC, WASHOUT, DRAIN PLACEMENT;  Surgeon: Romie Levee, MD;  Location: [facility];  Service: General;  Laterality: N/A;  . WISDOM TOOTH EXTRACTION      Social History   Tobacco Use  . Smoking status: Never Smoker  . Smokeless tobacco: Never Used  Substance Use Topics  . Alcohol use: Yes    Comment: occ  . Drug use: No    Family History  Problem Relation Age of Onset  . Atrial fibrillation Mother     Allergies  Allergen Reactions  . Adhesive [Tape] Other (See Comments)    "acts like poison ivy" blisters up, itching  . Betadine  [Povidone Iodine] Other (See Comments)    Blisters, itching    Medication list has been reviewed and updated.  Current Outpatient Medications on File Prior to Visit  Medication Sig Dispense Refill  . acetaminophen (TYLENOL) 650 MG CR tablet Take 650 mg by mouth at bedtime.     Marland Kitchen aspirin 81 MG tablet Take 81 mg by mouth daily.    . calcium carbonate (TUMS - DOSED IN MG ELEMENTAL CALCIUM) 500 MG chewable tablet Chew 1 tablet by mouth every 8 (eight) hours as needed for indigestion or heartburn.    . cimetidine (TAGAMET) 200 MG tablet Take 200 mg by mouth every 8 (eight) hours as needed (acid reflux).    . cyclobenzaprine (FLEXERIL) 5 MG tablet Take 1 tablet (5 mg total) by mouth daily as needed for muscle spasms. 30 tablet 0  . diphenhydrAMINE (BENADRYL) 25 mg capsule Take 25 mg by mouth at bedtime.    Marland Kitchen FLUoxetine (PROZAC) 20 MG tablet Take 2 tablets (40 mg total) by mouth daily. (Patient taking differently: Take 20 mg by mouth daily. ) 60 tablet 3  . glipiZIDE (GLUCOTROL) 5 MG tablet Take 0.5 tablets (2.5 mg total) by mouth daily before breakfast. 30 tablet 6  . ketorolac (TORADOL) 10 MG tablet Take 1 tablet (10 mg total) by mouth every 6 (six) hours as needed. 20 tablet 0  . lisinopril (PRINIVIL,ZESTRIL) 20 MG tablet Take 1 tablet (20 mg total) by mouth daily. 90 tablet 3  . metFORMIN (GLUCOPHAGE) 500 MG tablet Take 2 pills twice daily (Patient taking differently: Take 1,000 mg by mouth 2 (two) times daily with a meal. ) 360 tablet 3  . Multiple Vitamin (MULTIVITAMIN) tablet Take 1 tablet by mouth daily.    . naproxen sodium (ANAPROX) 220 MG tablet Take 2 tablets (440 mg total) by mouth 2 (two) times daily as needed (pain). 60 tablet 2  . pantoprazole (PROTONIX) 40 MG tablet TAKE 1 TABLET(40 MG) BY MOUTH DAILY (Patient taking differently: Take 40 mg by mouth daily as needed (acid reflux). ) 30 tablet 5  . pravastatin (PRAVACHOL) 80 MG tablet Take 1 by mouth daily (Patient taking differently:  Take 80 mg by mouth daily. ) 90 tablet 3  . tamsulosin (FLOMAX) 0.4 MG CAPS capsule Take 1 capsule (0.4 mg total) by mouth daily. 14 capsule 0  . HYDROcodone-acetaminophen (NORCO/VICODIN) 5-325 MG tablet Take 1 tablet by mouth every 6 (six) hours as needed for moderate pain. (Patient not taking: Reported on 01/17/2019) 20 tablet 0   No current facility-administered medications on file prior to visit.     Review of Systems:  As per HPI- otherwise negative.  No fever or chills, no chest pain or shortness of breath Physical Examination: Vitals:   01/17/19 0945  BP: 120/68  Pulse: 81  Resp: 16  Temp: 98.6 F (37 C)  SpO2: 98%   Vitals:   01/17/19 0945  Weight: 232 lb (105.2 kg)  Height: 6' (1.829 m)   Body mass index is 31.46 kg/m. Ideal Body Weight: Weight in (lb) to have BMI = 25: 183.9  GEN: WDWN, NAD, Non-toxic, A & O x 3, overweight, looks well  HEENT: Atraumatic, Normocephalic. Neck supple. No masses, No LAD. Ears and Nose: No external deformity. CV: RRR, No M/G/R. No JVD. No thrill. No extra heart sounds. PULM: CTA B, no wheezes, crackles, rhonchi. No retractions. No resp. distress. No accessory muscle use. ABD: S, NT, ND, +BS. No rebound. No HSM.  Benign belly EXTR: No c/c/e NEURO Normal gait.  PSYCH: Normally interactive. Conversant. Not depressed or anxious appearing.  Calm demeanor. '  Assessment and Plan: Essential hypertension  Controlled type 2 diabetes mellitus without complication, without long-term current use of insulin (HCC) - Plan: Hemoglobin A1c, Basic metabolic panel  Leukocytosis, unspecified type - Plan: CBC  Urinary frequency - Plan: Urine Culture  Follow-up visit today Blood pressures well controlled Await A1c, he is NOT taking glipizide.  We will be in touch with him pending his A1c He was seen recently for a likely urineary tract stone, though a CT was not done.  His symptoms are improved but he still notes some urinary frequency.  We will  repeat a urine culture  Signed Abbe Amsterdam, MD  Received his labs as below, message to patient  Results for orders placed or performed in visit on 01/17/19  CBC  Result Value Ref Range   WBC 5.4 4.0 - 10.5 K/uL   RBC 3.63 (L) 4.22 - 5.81 Mil/uL   Platelets 238.0 150.0 - 400.0 K/uL   Hemoglobin 12.4 (L) 13.0 - 17.0 g/dL   HCT 28.7 (L) 86.7 - 67.2 %   MCV 95.7 78.0 - 100.0 fl   MCHC 35.7 30.0 - 36.0 g/dL   RDW 09.4 70.9 - 62.8 %  Hemoglobin A1c  Result Value Ref Range   Hgb A1c MFr Bld 7.4 (H) 4.6 - 6.5 %  Basic metabolic panel  Result Value Ref Range   Sodium 139 135 - 145 mEq/L   Potassium 3.9 3.5 - 5.1 mEq/L   Chloride 103 96 - 112 mEq/L   CO2 27 19 - 32 mEq/L   Glucose, Bld 203 (H) 70 - 99 mg/dL   BUN 15 6 - 23 mg/dL   Creatinine, Ser 3.66 0.40 - 1.50 mg/dL   Calcium 9.2 8.4 - 29.4 mg/dL   GFR 76.54 >65.03 mL/min   A1c has improved from 8.1%. Minimal anemia, stable White cell count back to normal He had a cholecystectomy in November, and his red cell counts may still be normalizing-hemoglobin nadir of 8.4 in November Colonoscopy was done in 2013 We will send stool cards to make sure no occult blood  Your white blood cell count is back to normal.  You are still minimally anemic, I suspect you may be still recovering from blood loss related to your surgery in November However, I do want to make sure you are not losing any blood in your stool, we will send you some stool cards to complete at home  Your A1c is satisfactory!  Continue your current medication regimen Metabolic profile looks fine  Let us plan to visit in 4 to 6 months, take care

## 2019-01-17 ENCOUNTER — Other Ambulatory Visit: Payer: Self-pay

## 2019-01-17 ENCOUNTER — Encounter: Payer: Self-pay | Admitting: Family Medicine

## 2019-01-17 ENCOUNTER — Ambulatory Visit: Payer: BLUE CROSS/BLUE SHIELD | Admitting: Family Medicine

## 2019-01-17 VITALS — BP 120/68 | HR 81 | Temp 98.6°F | Resp 16 | Ht 72.0 in | Wt 232.0 lb

## 2019-01-17 DIAGNOSIS — I10 Essential (primary) hypertension: Secondary | ICD-10-CM | POA: Diagnosis not present

## 2019-01-17 DIAGNOSIS — E119 Type 2 diabetes mellitus without complications: Secondary | ICD-10-CM

## 2019-01-17 DIAGNOSIS — D72829 Elevated white blood cell count, unspecified: Secondary | ICD-10-CM

## 2019-01-17 DIAGNOSIS — D649 Anemia, unspecified: Secondary | ICD-10-CM

## 2019-01-17 DIAGNOSIS — R350 Frequency of micturition: Secondary | ICD-10-CM | POA: Diagnosis not present

## 2019-01-17 LAB — BASIC METABOLIC PANEL
BUN: 15 mg/dL (ref 6–23)
CO2: 27 mEq/L (ref 19–32)
Calcium: 9.2 mg/dL (ref 8.4–10.5)
Chloride: 103 mEq/L (ref 96–112)
Creatinine, Ser: 0.93 mg/dL (ref 0.40–1.50)
GFR: 81.01 mL/min (ref >60.00)
Glucose, Bld: 203 mg/dL — ABNORMAL HIGH (ref 70–99)
Potassium: 3.9 mEq/L (ref 3.5–5.1)
Sodium: 139 mEq/L (ref 135–145)

## 2019-01-17 LAB — CBC
HCT: 34.8 % — ABNORMAL LOW (ref 39.0–52.0)
Hemoglobin: 12.4 g/dL — ABNORMAL LOW (ref 13.0–17.0)
MCHC: 35.7 g/dL (ref 30.0–36.0)
MCV: 95.7 fl (ref 78.0–100.0)
Platelets: 238 10*3/uL (ref 150.0–400.0)
RBC: 3.63 Mil/uL — ABNORMAL LOW (ref 4.22–5.81)
RDW: 12.8 % (ref 11.5–15.5)
WBC: 5.4 10*3/uL (ref 4.0–10.5)

## 2019-01-17 LAB — HEMOGLOBIN A1C: Hgb A1c MFr Bld: 7.4 % — ABNORMAL HIGH (ref 4.6–6.5)

## 2019-01-17 NOTE — Patient Instructions (Signed)
It was great to see you again, as always Please investigate a nutritionist who is convenient your home.  I am glad to send a referral to your preferred location Blood pressure looks great We will check on your A1c and other labs today. Please also have the lab collect a urine culture, I will make sure you have not developed an infection

## 2019-01-17 NOTE — Addendum Note (Signed)
Addended by: Abbe Amsterdam C on: 01/17/2019 08:06 PM   Modules accepted: Orders

## 2019-01-18 LAB — URINE CULTURE
MICRO NUMBER:: 305633
Result:: NO GROWTH
SPECIMEN QUALITY:: ADEQUATE

## 2019-01-22 ENCOUNTER — Other Ambulatory Visit: Payer: Self-pay | Admitting: Family Medicine

## 2019-01-22 DIAGNOSIS — R319 Hematuria, unspecified: Secondary | ICD-10-CM

## 2019-05-28 ENCOUNTER — Other Ambulatory Visit: Payer: Self-pay | Admitting: Family Medicine

## 2019-05-28 DIAGNOSIS — E119 Type 2 diabetes mellitus without complications: Secondary | ICD-10-CM

## 2019-06-15 ENCOUNTER — Other Ambulatory Visit: Payer: Self-pay | Admitting: Family Medicine

## 2019-06-15 DIAGNOSIS — E782 Mixed hyperlipidemia: Secondary | ICD-10-CM

## 2019-07-14 IMAGING — CT CT ABD-PELV W/ CM
2 of 5 series · 14 of 46 positions shown, 16 images · IV contrast (omnipaque)
Comparison: CT abdomen pelvis-08/27/2018; nuclear medicine HIDA
scan-10/07/2018

CLINICAL DATA: Recent cholecystectomy, now with concern for biliary
leak. Please perform CT scan to evaluate for biloma.

EXAM:
CT ABDOMEN AND PELVIS WITH CONTRAST
TECHNIQUE: Multidetector CT imaging of the abdomen and pelvis was performed
using the standard protocol following bolus administration of
intravenous contrast.
CONTRAST:  100mL OMNIPAQUE IOHEXOL 300 MG/ML  SOLN

[Series 2: axial st · axial · 0.86mm/px · z∈[-127,+308]mm · 11 of 99 slices shown, 13 images]
[im 6/99  soft-tissue]
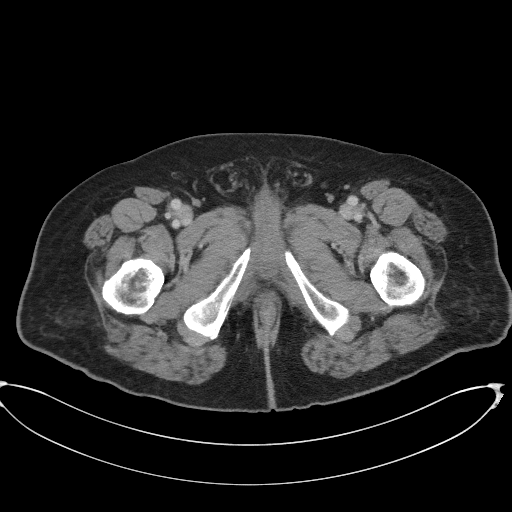
[im 6/99  bone]
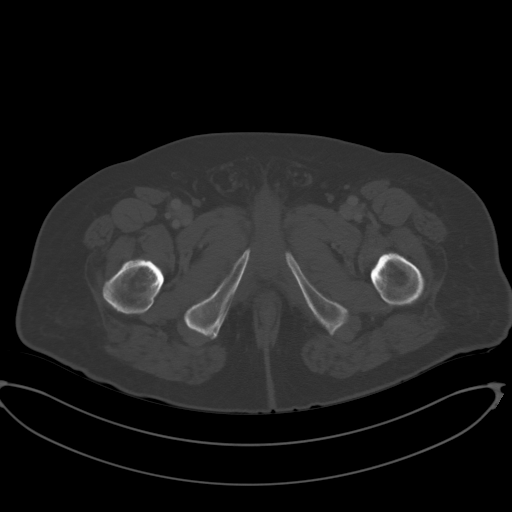
[im 18/99  soft-tissue]
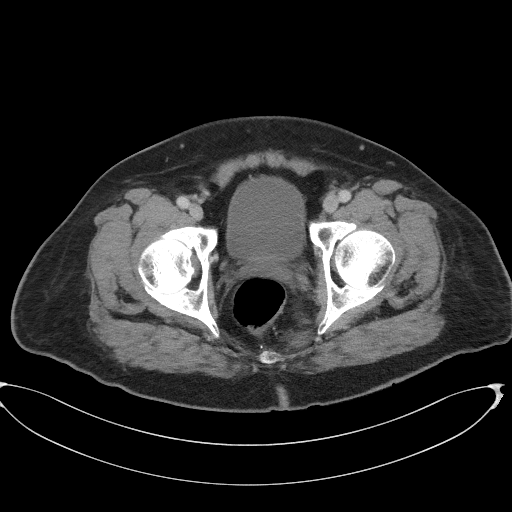
[im 24/99  soft-tissue]
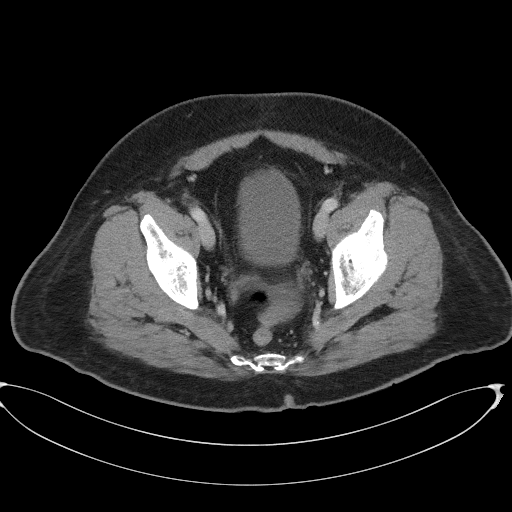
[im 35/99  soft-tissue]
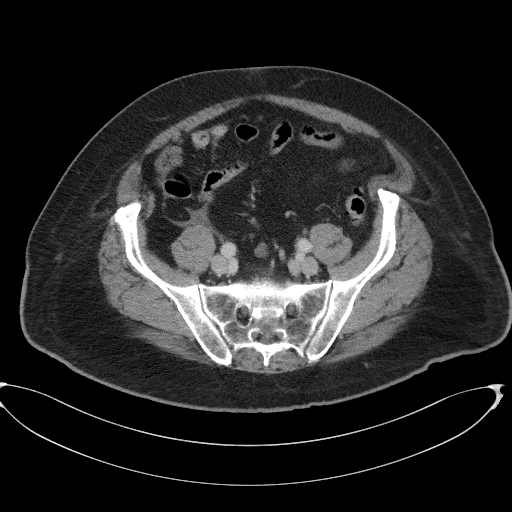
[im 41/99  soft-tissue]
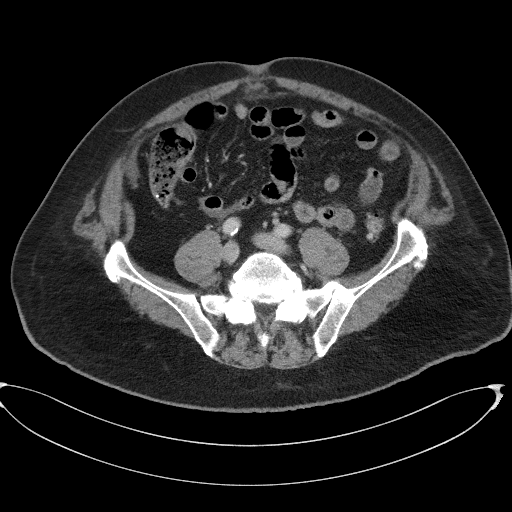
[im 52/99  soft-tissue]
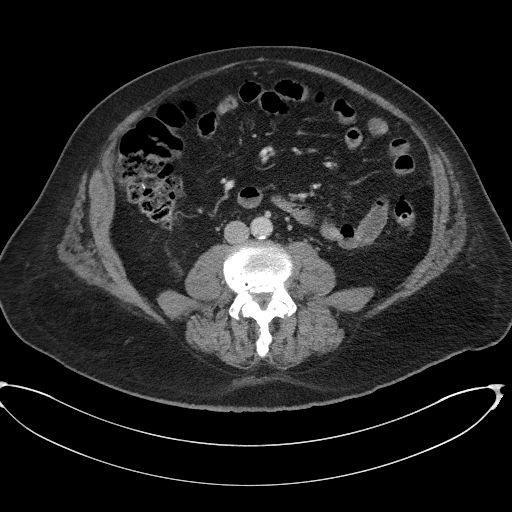
[im 58/99  soft-tissue]
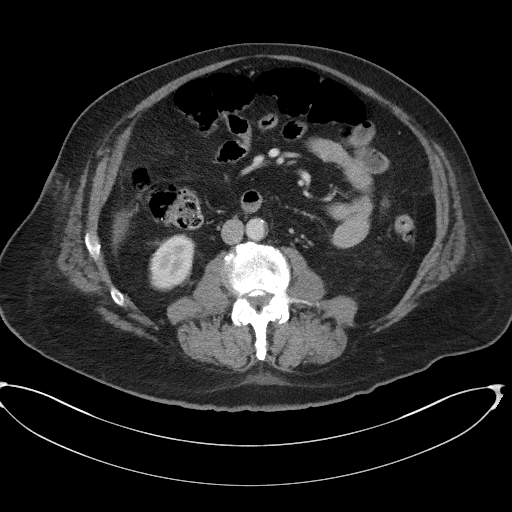
[im 64/99  soft-tissue]
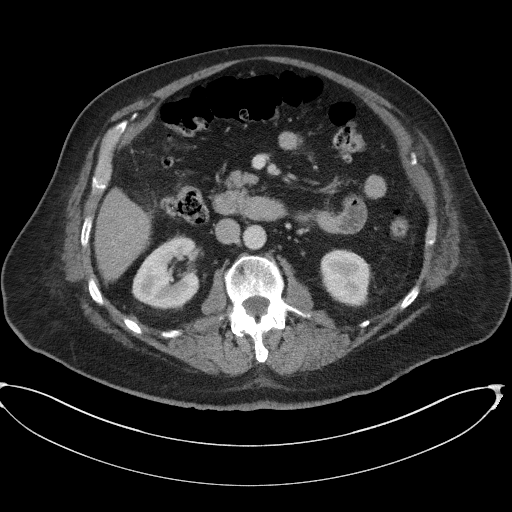
[im 75/99  soft-tissue]
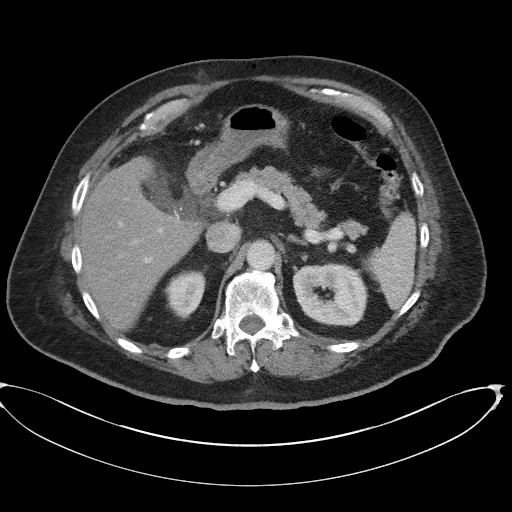
[im 75/99  bone]
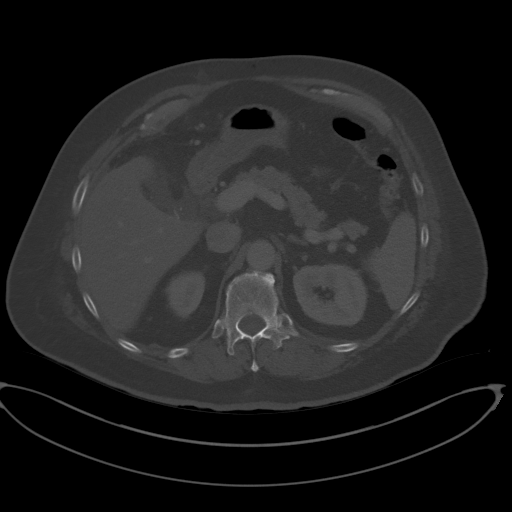
[im 81/99  soft-tissue]
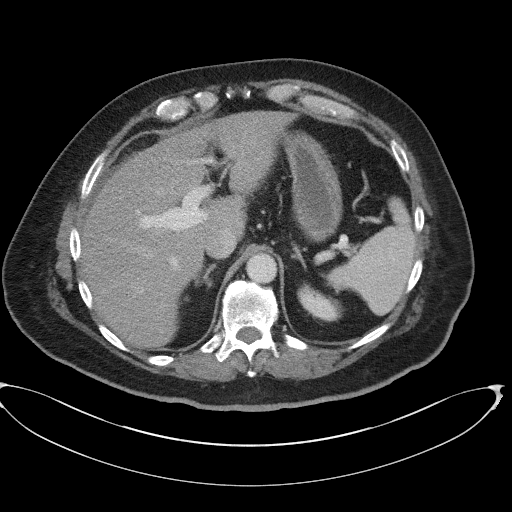
[im 93/99  soft-tissue]
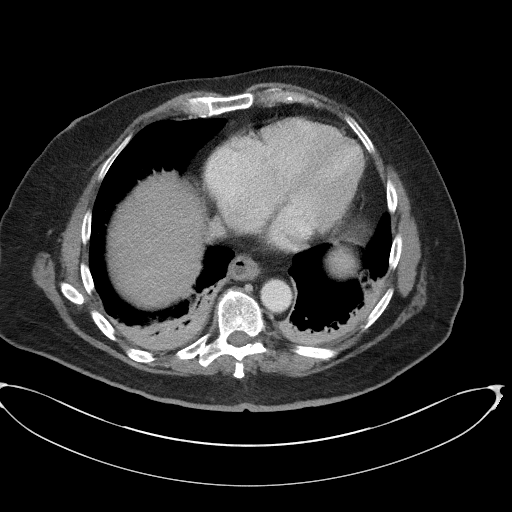

[Series 5: coronal st · coronal · 0.90mm/px · 3 of 103 slices shown]
[im 35/103  soft-tissue]
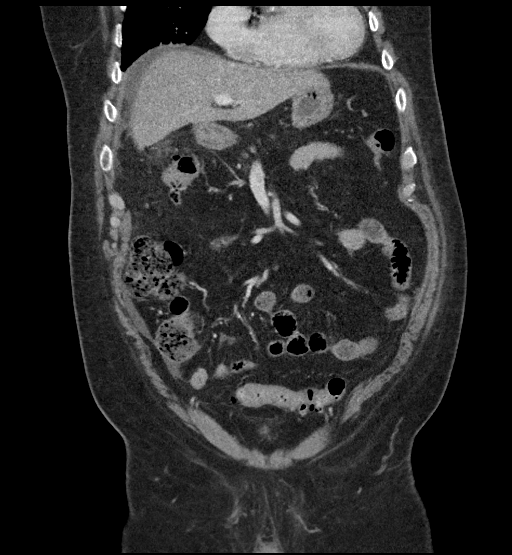
[im 46/103  soft-tissue]
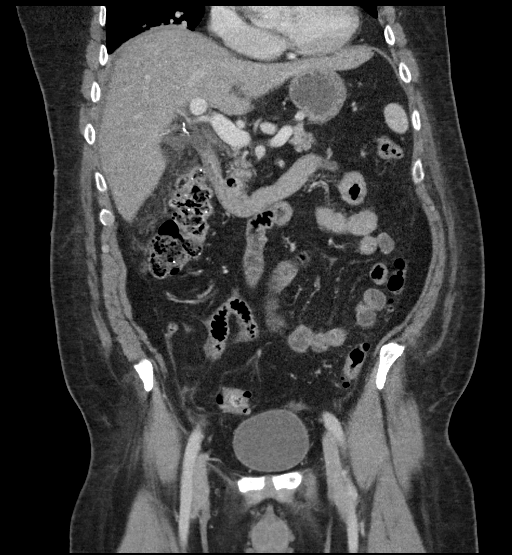
[im 57/103  soft-tissue]
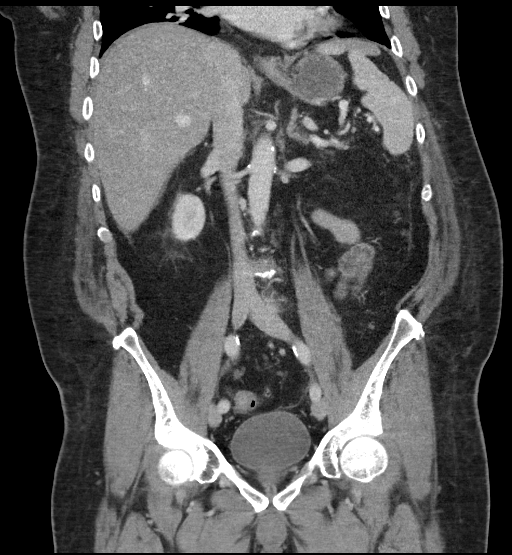

[14 of 46 positions shown; findings below may reference images not displayed]

FINDINGS: Lower chest: Limited visualization of the lower thorax demonstrates
bibasilar consolidative opacities with associated air bronchograms,
right greater than left. No definite pleural effusion.

Borderline cardiomegaly.  No pericardial effusion.

Hepatobiliary: Normal hepatic contour. There is diffuse decreased
attenuation hepatic parenchyma suggestive of hepatic steatosis. No
discrete hepatic lesions.

Interval cholecystectomy with small amount of fluid seen within the
gallbladder fossa, about the liver edge and tracking along the right
pericolic gutter to the level of the pelvic cul-de-sac without
definable/drainable fluid collection or biloma. Common bile duct
appears nondilated measuring approximately 0.6 cm in diameter
(coronal image 45, series 5). No intrahepatic biliary duct
dilatation.

Pancreas: Normal appearance of the pancreas

Spleen: Normal appearance of the spleen

Adrenals/Urinary Tract: There is symmetric enhancement and excretion
of the bilateral kidneys. Note is made of a punctate (approximately
0.4 cm) nonobstructing stone within the interpolar aspect of the
right kidney (coronal image 69, series 5). Note is made of a
punctate (sub 5 mm) hypoattenuating lesion within the interpolar
aspect the left kidney (coronal image 69, series 5), too small to
accurately characterize of favored to represent a renal cyst. No
discrete right-sided renal lesions. No urinary obstruction or
perinephric stranding.

Normal appearance the bilateral adrenal glands.

Normal appearance of the urinary bladder given degree distention.

Stomach/Bowel: Rather extensive colonic diverticulosis, primarily
involving the descending and sigmoid colon, without evidence of
superimposed acute diverticulitis. The bowel is otherwise normal in
course and caliber without wall thickening or evidence of enteric
obstruction. Normal appearance of the terminal ileum. The appendix
is not visualized, however there is no pericecal inflammatory
change. Note is made of a small duodenal diverticulum. There is a
trace amount of pneumoperitoneum about the liver edge likely the
sequela of recent cholecystectomy state. No pneumatosis or portal
venous gas.

Vascular/Lymphatic: Minimal amount of mixed calcified and
noncalcified atherosclerotic plaque with a normal caliber abdominal
aorta, not resulting in hemodynamically significant stenosis.

No bulky retroperitoneal, mesenteric, pelvic or inguinal
lymphadenopathy.

Reproductive: Normal appearance the prostate gland. There is a small
amount of fluid seen with the pelvic cul-de-sac.

Other: There is a trace amount of subcutaneous emphysema about the
midline of the abdomen (representative axial image 24, series 2),
likely the sequela of recent cholecystectomy state. Ill-defined
stranding about the umbilicus, likely location laparoscopy port.

Musculoskeletal: No acute or aggressive osseous abnormalities.
Moderate to severe multilevel lumbar spine DDD, likely worse at
L2-L3, L3-L4 and L5-S1 with disc space height loss, endplate
irregularity small posteriorly directed disc osteophyte complexes at
these locations.
IMPRESSION: 1. Post cholecystectomy with trace amount of fluid within the
gallbladder fossa, about the liver edge and tracking along the right
pericolic gutter to the level of the pelvic cul-de-sac without
definable/drainable biloma.
2. Worsening bibasilar opacities within the imaged lung bases,
likely atelectasis.
3. Rather extensive colonic diverticulitis without evidence of
superimposed acute diverticulitis.
4. Suspected hepatic steatosis.  Correlation with LFTs is advised.
5. Solitary punctate (approximately 4 mm) nonobstructing right-sided
renal stone.
6.  Aortic Atherosclerosis ([id number]).

Above findings discussed with Dr. Hindseth at the time of examination
completion.

## 2019-07-17 ENCOUNTER — Other Ambulatory Visit: Payer: Self-pay | Admitting: Family Medicine

## 2019-07-17 DIAGNOSIS — I10 Essential (primary) hypertension: Secondary | ICD-10-CM

## 2019-08-28 DIAGNOSIS — Z23 Encounter for immunization: Secondary | ICD-10-CM | POA: Diagnosis not present

## 2019-09-03 ENCOUNTER — Encounter: Payer: Self-pay | Admitting: Family Medicine

## 2019-12-29 NOTE — Patient Instructions (Addendum)
It was great to see you again today, I will be in touch with your labs as soon as possible You might consider getting the shingirx vaccine at your pharmacy at your convenience   You got your pneumonia booster today We will re-test for H pylori today.  If negative I would suggest starting you an on rx PPI such as omeprazole and would like to refer you to GI.  At this point you may benefit from an upper GI scope to get more information  Assuming labs are ok we can plan to visit in 6 months

## 2019-12-29 NOTE — Progress Notes (Addendum)
[facility] [address], [address] [place], [place] [address] [phone]  Date:  12/31/2019   Name:  Chad Cox   DOB:  11-23-1950   MRN:  [id number]  PCP:  Pearline Cables, MD    Chief Complaint: Medication Refill and Gastroesophageal Reflux   History of Present Illness:  Chad Cox is a 69 y.o. very pleasant male patient who presents with the following:  [name] is here today for routine follow-up visit History of diabetes, hypertension, dyslipidemia, obesity  S/p chole in 2019 Last seen by myself in March 2020-prolonged absence due to COVID-19 pandemic  He is using tagament BID right now for GERD- he woke up with a feeling of dyspepsia, he has to prop himself up in bed.  "like I'm breathing fire." He may cough up phlegm in the am- this does not bother him that much  He has noted these sx for months to years - may be getting worse  He may also use tums prn He tries not to eat anything after 8 and goes to bed at 10-10:30 He generally eats dinner 7-7:30 pm if possible   He did take prilosec in the past - this did seem to help but he was afraid to take it longer than 14 days.  It did help for a period of time before sx returned  He last took a PPI perhaps 2 months ago  Lab Results  Component Value Date   HGBA1C 7.4 (H) 01/17/2019   Foot exam due Eye exam- done last fall Labs are due-most recent labs from March 2020.  He is fasting today Prevnar due- will give today He is working on getting his covid vaccine but this is slow going Flu shot up-to-date  Aspirin 81 Lisinopril 20 Metformin thousand twice daily Glipizide 2.5 daily Pravastatin Flomax  Wt Readings from Last 3 Encounters:  12/31/19 227 lb (103 kg)  01/17/19 232 lb (105.2 kg)  01/11/19 229 lb 4 oz (104 kg)    Patient Active Problem List   Diagnosis Date Noted  . Kidney pain 01/11/2019  . Hematuria 01/11/2019  . Bile leak, postoperative  09/26/2018  . Gallstones and inflammation of gallbladder without obstruction 09/20/2018  . Cholelithiasis and cholecystitis without obstruction 09/20/2018  . Obesity, unspecified 04/29/2014  . Diabetes mellitus 01/30/2012  . Hypertension 01/30/2012  . Dyslipidemia 01/30/2012    Past Medical History:  Diagnosis Date  . Bile leak, postoperative 09/26/2018  . Cholecystitis   . Diabetes mellitus without complication (HCC)   . Gallstones and inflammation of gallbladder without obstruction 09/20/2018  . Hypertension   . Kidney stone     Past Surgical History:  Procedure Laterality Date  . CHOLECYSTECTOMY N/A 09/20/2018   Procedure: LAPAROSCOPIC CHOLECYSTECTOMY WITH PRIMARY UMBILICAL HERNIA REPAIR;  Surgeon: Claud Kelp, MD;  Location: [facility];  Service: General;  Laterality: N/A;  . ERCP N/A 09/21/2018   Procedure: ENDOSCOPIC RETROGRADE CHOLANGIOPANCREATOGRAPHY (ERCP);  Surgeon: Jeani Hawking, MD;  Location: [facility];  Service: Endoscopy;  Laterality: N/A;  . LAPAROSCOPY N/A 09/21/2018   Procedure: LAPAROSCOPY DIAGNOSTIC, WASHOUT, DRAIN PLACEMENT;  Surgeon: Romie Levee, MD;  Location: [facility];  Service: General;  Laterality: N/A;  . WISDOM TOOTH EXTRACTION      Social History   Tobacco Use  . Smoking status: Never Smoker  . Smokeless tobacco: Never Used  Substance Use Topics  . Alcohol use: Yes    Comment: occ  . Drug use:  No    Family History  Problem Relation Age of Onset  . Atrial fibrillation Mother     Allergies  Allergen Reactions  . Adhesive [Tape] Other (See Comments)    "acts like poison ivy" blisters up, itching  . Betadine [Povidone Iodine] Other (See Comments)    Blisters, itching    Medication list has been reviewed and updated.  Current Outpatient Medications on File Prior to Visit  Medication Sig Dispense Refill  . acetaminophen (TYLENOL) 650 MG CR tablet Take 650 mg by mouth at bedtime.     Marland Kitchen aspirin 81 MG tablet Take 81 mg by mouth daily.     . calcium carbonate (TUMS - DOSED IN MG ELEMENTAL CALCIUM) 500 MG chewable tablet Chew 1 tablet by mouth every 8 (eight) hours as needed for indigestion or heartburn.    . cimetidine (TAGAMET) 200 MG tablet Take 200 mg by mouth every 8 (eight) hours as needed (acid reflux).    Marland Kitchen diphenhydrAMINE (BENADRYL) 25 mg capsule Take 25 mg by mouth at bedtime.    Marland Kitchen lisinopril (ZESTRIL) 20 MG tablet TAKE 1 TABLET(20 MG) BY MOUTH DAILY 90 tablet 3  . metFORMIN (GLUCOPHAGE) 500 MG tablet Take 2 tablets (1,000 mg total) by mouth 2 (two) times daily with a meal. 360 tablet 1  . Multiple Vitamin (MULTIVITAMIN) tablet Take 1 tablet by mouth daily.    . naproxen sodium (ANAPROX) 220 MG tablet Take 2 tablets (440 mg total) by mouth 2 (two) times daily as needed (pain). 60 tablet 2  . pravastatin (PRAVACHOL) 80 MG tablet Take 1 tablet (80 mg total) by mouth daily. 90 tablet 1   No current facility-administered medications on file prior to visit.    Review of Systems:  As per HPI- otherwise negative.    Physical Examination: Vitals:   12/31/19 0913  BP: 122/72  Pulse: 93  Resp: 16  Temp: (!) 96.5 F (35.8 C)  SpO2: 96%   Vitals:   12/31/19 0913  Weight: 227 lb (103 kg)  Height: 6' (1.829 m)   Body mass index is 30.79 kg/m. Ideal Body Weight: Weight in (lb) to have BMI = 25: 183.9  GEN: no acute distress.  Obese, looks well and his normal self HEENT: Atraumatic, Normocephalic.  PERRL Ears and Nose: No external deformity. CV: RRR, No M/G/R. No JVD. No thrill. No extra heart sounds. PULM: CTA B, no wheezes, crackles, rhonchi. No retractions. No resp. distress. No accessory muscle use. ABD: S, NT, ND, +BS. No rebound. No HSM. EXTR: No c/c/e PSYCH: Normally interactive. Conversant.  Foot exam is normal today  Assessment and Plan: Essential hypertension - Plan: CBC, Comprehensive metabolic panel  Controlled type 2 diabetes mellitus without complication, without long-term current use of  insulin (HCC) - Plan: Comprehensive metabolic panel, Hemoglobin A1c, Microalbumin / creatinine urine ratio, metFORMIN (GLUCOPHAGE) 1000 MG tablet  Mixed hyperlipidemia - Plan: Lipid panel, pravastatin (PRAVACHOL) 80 MG tablet  Screening for prostate cancer - Plan: PSA  Gastroesophageal reflux disease without esophagitis - Plan: H. pylori breath test  Immunization due - Plan: Pneumococcal conjugate vaccine 13-valent IM  Here today for follow-up visit.  Tim's main concern today is reflux, he has suffered from this for at least a couple of years.  We did an H. pylori in 2019 which was negative, will repeat today.  He is currently taking over-the-counter H2 blocker which offers some relief. If H. pylori positive will treat as such.  If negative plan to start PPI  and refer back to gastroenterology.  I also suggested elevating the head of his bed with riser such as bricks or concrete blocks  Blood pressure under good control  Diabetes labs pending today  Updated pneumonia vaccine  Recommend Shingrix and Covid vaccine when available Moderate medical decision making today  This visit occurred during the SARS-CoV-2 public health emergency.  Safety protocols were in place, including screening questions prior to the visit, additional usage of staff PPE, and extensive cleaning of exam room while observing appropriate contact time as indicated for disinfecting solutions.     Signed Abbe Amsterdam, MD  Received labs 2/23, message to patient  Results for orders placed or performed in visit on 12/31/19  CBC  Result Value Ref Range   WBC 6.5 4.0 - 10.5 K/uL   RBC 4.09 (L) 4.22 - 5.81 Mil/uL   Platelets 262.0 150.0 - 400.0 K/uL   Hemoglobin 13.7 13.0 - 17.0 g/dL   HCT 29.9 37.1 - 69.6 %   MCV 96.8 78.0 - 100.0 fl   MCHC 34.6 30.0 - 36.0 g/dL   RDW 78.9 38.1 - 01.7 %  Comprehensive metabolic panel  Result Value Ref Range   Sodium 140 135 - 145 mEq/L   Potassium 3.9 3.5 - 5.1 mEq/L    Chloride 105 96 - 112 mEq/L   CO2 25 19 - 32 mEq/L   Glucose, Bld 368 (H) 70 - 99 mg/dL   BUN 8 6 - 23 mg/dL   Creatinine, Ser 5.10 0.40 - 1.50 mg/dL   Total Bilirubin 0.4 0.2 - 1.2 mg/dL   Alkaline Phosphatase 70 39 - 117 U/L   AST 13 0 - 37 U/L   ALT 17 0 - 53 U/L   Total Protein 6.8 6.0 - 8.3 g/dL   Albumin 4.5 3.5 - 5.2 g/dL   GFR 25.85 >27.78 mL/min   Calcium 9.5 8.4 - 10.5 mg/dL  Hemoglobin E4M  Result Value Ref Range   Hgb A1c MFr Bld 9.8 (H) 4.6 - 6.5 %  Lipid panel  Result Value Ref Range   Cholesterol 154 0 - 200 mg/dL   Triglycerides 353.6 0.0 - 149.0 mg/dL   HDL 14.43 >15.40 mg/dL   VLDL 08.6 0.0 - 76.1 mg/dL   LDL Cholesterol 84 0 - 99 mg/dL   Total CHOL/HDL Ratio 4    NonHDL 111.20   PSA  Result Value Ref Range   PSA 2.14 0.10 - 4.00 ng/mL  Microalbumin / creatinine urine ratio  Result Value Ref Range   Microalb, Ur 6.1 (H) 0.0 - 1.9 mg/dL   Creatinine,U 950.9 mg/dL   Microalb Creat Ratio 5.5 0.0 - 30.0 mg/g  H. pylori breath test  Result Value Ref Range   H. pylori Breath Test NOT DETECTED NOT DETECT    He has been a patient of Dr. Loreta Ave in the past

## 2019-12-31 ENCOUNTER — Other Ambulatory Visit: Payer: Self-pay

## 2019-12-31 ENCOUNTER — Ambulatory Visit (INDEPENDENT_AMBULATORY_CARE_PROVIDER_SITE_OTHER): Payer: BC Managed Care – PPO | Admitting: Family Medicine

## 2019-12-31 ENCOUNTER — Encounter: Payer: Self-pay | Admitting: Family Medicine

## 2019-12-31 VITALS — BP 122/72 | HR 93 | Temp 96.5°F | Resp 16 | Ht 72.0 in | Wt 227.0 lb

## 2019-12-31 DIAGNOSIS — K219 Gastro-esophageal reflux disease without esophagitis: Secondary | ICD-10-CM

## 2019-12-31 DIAGNOSIS — I10 Essential (primary) hypertension: Secondary | ICD-10-CM

## 2019-12-31 DIAGNOSIS — E119 Type 2 diabetes mellitus without complications: Secondary | ICD-10-CM

## 2019-12-31 DIAGNOSIS — E782 Mixed hyperlipidemia: Secondary | ICD-10-CM | POA: Diagnosis not present

## 2019-12-31 DIAGNOSIS — Z125 Encounter for screening for malignant neoplasm of prostate: Secondary | ICD-10-CM

## 2019-12-31 DIAGNOSIS — Z23 Encounter for immunization: Secondary | ICD-10-CM

## 2019-12-31 LAB — CBC
HCT: 39.6 % (ref 39.0–52.0)
Hemoglobin: 13.7 g/dL (ref 13.0–17.0)
MCHC: 34.6 g/dL (ref 30.0–36.0)
MCV: 96.8 fl (ref 78.0–100.0)
Platelets: 262 10*3/uL (ref 150.0–400.0)
RBC: 4.09 Mil/uL — ABNORMAL LOW (ref 4.22–5.81)
RDW: 13.2 % (ref 11.5–15.5)
WBC: 6.5 10*3/uL (ref 4.0–10.5)

## 2019-12-31 LAB — LIPID PANEL
Cholesterol: 154 mg/dL (ref 0–200)
HDL: 42.6 mg/dL (ref >39.00)
LDL Cholesterol: 84 mg/dL (ref 0–99)
NonHDL: 111.2
Total CHOL/HDL Ratio: 4
Triglycerides: 136 mg/dL (ref 0.0–149.0)
VLDL: 27.2 mg/dL (ref 0.0–40.0)

## 2019-12-31 LAB — COMPREHENSIVE METABOLIC PANEL
ALT: 17 U/L (ref 0–53)
AST: 13 U/L (ref 0–37)
Albumin: 4.5 g/dL (ref 3.5–5.2)
Alkaline Phosphatase: 70 U/L (ref 39–117)
BUN: 8 mg/dL (ref 6–23)
CO2: 25 mEq/L (ref 19–32)
Calcium: 9.5 mg/dL (ref 8.4–10.5)
Chloride: 105 mEq/L (ref 96–112)
Creatinine, Ser: 0.95 mg/dL (ref 0.40–1.50)
GFR: 78.82 mL/min (ref >60.00)
Glucose, Bld: 368 mg/dL — ABNORMAL HIGH (ref 70–99)
Potassium: 3.9 mEq/L (ref 3.5–5.1)
Sodium: 140 mEq/L (ref 135–145)
Total Bilirubin: 0.4 mg/dL (ref 0.2–1.2)
Total Protein: 6.8 g/dL (ref 6.0–8.3)

## 2019-12-31 LAB — HEMOGLOBIN A1C: Hgb A1c MFr Bld: 9.8 % — ABNORMAL HIGH (ref 4.6–6.5)

## 2019-12-31 LAB — MICROALBUMIN / CREATININE URINE RATIO
Creatinine,U: 110.7 mg/dL
Microalb Creat Ratio: 5.5 mg/g (ref 0.0–30.0)
Microalb, Ur: 6.1 mg/dL — ABNORMAL HIGH (ref 0.0–1.9)

## 2019-12-31 LAB — PSA: PSA: 2.14 ng/mL (ref 0.10–4.00)

## 2019-12-31 MED ORDER — PRAVASTATIN SODIUM 80 MG PO TABS: 80.0000 mg | ORAL_TABLET | Freq: Every day | ORAL | 3 refills | Status: DC

## 2019-12-31 MED ORDER — METFORMIN HCL 1000 MG PO TABS: 1000.0000 mg | ORAL_TABLET | Freq: Two times a day (BID) | ORAL | 3 refills | Status: DC

## 2020-01-01 ENCOUNTER — Encounter: Payer: Self-pay | Admitting: Family Medicine

## 2020-01-01 DIAGNOSIS — E119 Type 2 diabetes mellitus without complications: Secondary | ICD-10-CM

## 2020-01-01 LAB — H. PYLORI BREATH TEST: H. pylori Breath Test: NOT DETECTED

## 2020-01-01 MED ORDER — PANTOPRAZOLE SODIUM 40 MG PO TBEC: 40.0000 mg | DELAYED_RELEASE_TABLET | Freq: Every day | ORAL | 3 refills | Status: DC

## 2020-01-01 NOTE — Addendum Note (Signed)
Addended by: Pearline Cables on: 01/01/2020 06:15 PM   Modules accepted: Orders

## 2020-01-02 MED ORDER — FARXIGA 10 MG PO TABS: 10.0000 mg | ORAL_TABLET | Freq: Every day | ORAL | 6 refills | Status: DC

## 2020-01-07 ENCOUNTER — Encounter: Payer: Self-pay | Admitting: Family Medicine

## 2020-01-07 DIAGNOSIS — E785 Hyperlipidemia, unspecified: Secondary | ICD-10-CM | POA: Diagnosis not present

## 2020-01-07 DIAGNOSIS — I10 Essential (primary) hypertension: Secondary | ICD-10-CM | POA: Diagnosis not present

## 2020-01-07 DIAGNOSIS — R55 Syncope and collapse: Secondary | ICD-10-CM | POA: Diagnosis not present

## 2020-01-07 DIAGNOSIS — R519 Headache, unspecified: Secondary | ICD-10-CM | POA: Diagnosis not present

## 2020-01-07 DIAGNOSIS — K922 Gastrointestinal hemorrhage, unspecified: Secondary | ICD-10-CM | POA: Diagnosis not present

## 2020-01-07 DIAGNOSIS — K5730 Diverticulosis of large intestine without perforation or abscess without bleeding: Secondary | ICD-10-CM | POA: Diagnosis not present

## 2020-01-07 DIAGNOSIS — K219 Gastro-esophageal reflux disease without esophagitis: Secondary | ICD-10-CM | POA: Diagnosis not present

## 2020-01-07 DIAGNOSIS — Z9049 Acquired absence of other specified parts of digestive tract: Secondary | ICD-10-CM | POA: Diagnosis not present

## 2020-01-07 DIAGNOSIS — K449 Diaphragmatic hernia without obstruction or gangrene: Secondary | ICD-10-CM | POA: Diagnosis not present

## 2020-01-07 DIAGNOSIS — N400 Enlarged prostate without lower urinary tract symptoms: Secondary | ICD-10-CM | POA: Diagnosis not present

## 2020-01-07 DIAGNOSIS — E876 Hypokalemia: Secondary | ICD-10-CM | POA: Diagnosis not present

## 2020-01-07 DIAGNOSIS — E119 Type 2 diabetes mellitus without complications: Secondary | ICD-10-CM | POA: Diagnosis not present

## 2020-01-07 DIAGNOSIS — K921 Melena: Secondary | ICD-10-CM | POA: Diagnosis not present

## 2020-01-07 DIAGNOSIS — R42 Dizziness and giddiness: Secondary | ICD-10-CM | POA: Diagnosis not present

## 2020-01-07 DIAGNOSIS — D62 Acute posthemorrhagic anemia: Secondary | ICD-10-CM | POA: Diagnosis not present

## 2020-01-07 DIAGNOSIS — Z20822 Contact with and (suspected) exposure to covid-19: Secondary | ICD-10-CM | POA: Diagnosis not present

## 2020-01-07 DIAGNOSIS — Z7982 Long term (current) use of aspirin: Secondary | ICD-10-CM | POA: Diagnosis not present

## 2020-01-07 NOTE — Telephone Encounter (Signed)
Call patient but did not reach him.  Will reply to his MyChart message

## 2020-01-08 DIAGNOSIS — K219 Gastro-esophageal reflux disease without esophagitis: Secondary | ICD-10-CM | POA: Diagnosis not present

## 2020-01-08 DIAGNOSIS — I10 Essential (primary) hypertension: Secondary | ICD-10-CM | POA: Diagnosis not present

## 2020-01-08 DIAGNOSIS — R55 Syncope and collapse: Secondary | ICD-10-CM | POA: Diagnosis not present

## 2020-01-08 DIAGNOSIS — K5730 Diverticulosis of large intestine without perforation or abscess without bleeding: Secondary | ICD-10-CM | POA: Diagnosis not present

## 2020-01-08 DIAGNOSIS — K449 Diaphragmatic hernia without obstruction or gangrene: Secondary | ICD-10-CM | POA: Diagnosis not present

## 2020-01-08 DIAGNOSIS — R519 Headache, unspecified: Secondary | ICD-10-CM | POA: Diagnosis not present

## 2020-01-08 DIAGNOSIS — Z20822 Contact with and (suspected) exposure to covid-19: Secondary | ICD-10-CM | POA: Diagnosis not present

## 2020-01-08 DIAGNOSIS — Z9049 Acquired absence of other specified parts of digestive tract: Secondary | ICD-10-CM | POA: Diagnosis not present

## 2020-01-08 DIAGNOSIS — E119 Type 2 diabetes mellitus without complications: Secondary | ICD-10-CM | POA: Diagnosis not present

## 2020-01-08 DIAGNOSIS — K921 Melena: Secondary | ICD-10-CM | POA: Diagnosis not present

## 2020-01-08 DIAGNOSIS — D62 Acute posthemorrhagic anemia: Secondary | ICD-10-CM | POA: Diagnosis not present

## 2020-01-08 DIAGNOSIS — R42 Dizziness and giddiness: Secondary | ICD-10-CM | POA: Diagnosis not present

## 2020-01-08 DIAGNOSIS — N400 Enlarged prostate without lower urinary tract symptoms: Secondary | ICD-10-CM | POA: Diagnosis not present

## 2020-01-08 DIAGNOSIS — K922 Gastrointestinal hemorrhage, unspecified: Secondary | ICD-10-CM | POA: Diagnosis not present

## 2020-01-08 DIAGNOSIS — E876 Hypokalemia: Secondary | ICD-10-CM | POA: Diagnosis not present

## 2020-01-08 DIAGNOSIS — E785 Hyperlipidemia, unspecified: Secondary | ICD-10-CM | POA: Diagnosis not present

## 2020-01-09 DIAGNOSIS — Z20822 Contact with and (suspected) exposure to covid-19: Secondary | ICD-10-CM | POA: Diagnosis not present

## 2020-01-09 DIAGNOSIS — K922 Gastrointestinal hemorrhage, unspecified: Secondary | ICD-10-CM | POA: Diagnosis not present

## 2020-01-09 DIAGNOSIS — K921 Melena: Secondary | ICD-10-CM | POA: Diagnosis not present

## 2020-01-09 DIAGNOSIS — Z9049 Acquired absence of other specified parts of digestive tract: Secondary | ICD-10-CM | POA: Diagnosis not present

## 2020-01-09 DIAGNOSIS — E785 Hyperlipidemia, unspecified: Secondary | ICD-10-CM | POA: Diagnosis not present

## 2020-01-09 DIAGNOSIS — E119 Type 2 diabetes mellitus without complications: Secondary | ICD-10-CM | POA: Diagnosis not present

## 2020-01-09 DIAGNOSIS — K219 Gastro-esophageal reflux disease without esophagitis: Secondary | ICD-10-CM | POA: Diagnosis not present

## 2020-01-09 DIAGNOSIS — R519 Headache, unspecified: Secondary | ICD-10-CM | POA: Diagnosis not present

## 2020-01-09 DIAGNOSIS — E876 Hypokalemia: Secondary | ICD-10-CM | POA: Diagnosis not present

## 2020-01-09 DIAGNOSIS — D62 Acute posthemorrhagic anemia: Secondary | ICD-10-CM | POA: Diagnosis not present

## 2020-01-09 DIAGNOSIS — R55 Syncope and collapse: Secondary | ICD-10-CM | POA: Diagnosis not present

## 2020-01-09 DIAGNOSIS — K449 Diaphragmatic hernia without obstruction or gangrene: Secondary | ICD-10-CM | POA: Diagnosis not present

## 2020-01-09 DIAGNOSIS — N400 Enlarged prostate without lower urinary tract symptoms: Secondary | ICD-10-CM | POA: Diagnosis not present

## 2020-01-09 DIAGNOSIS — I10 Essential (primary) hypertension: Secondary | ICD-10-CM | POA: Diagnosis not present

## 2020-01-09 DIAGNOSIS — R42 Dizziness and giddiness: Secondary | ICD-10-CM | POA: Diagnosis not present

## 2020-01-09 DIAGNOSIS — K5730 Diverticulosis of large intestine without perforation or abscess without bleeding: Secondary | ICD-10-CM | POA: Diagnosis not present

## 2020-01-09 MED ORDER — PRAVASTATIN SODIUM 40 MG PO TABS
80.00 | ORAL_TABLET | ORAL | Status: DC
Start: 2020-01-10 — End: 2020-01-09

## 2020-01-09 MED ORDER — GLUCOSE 40 % PO GEL
15.00 | ORAL | Status: DC
Start: ? — End: 2020-01-09

## 2020-01-09 MED ORDER — DIPHENHYDRAMINE HCL 25 MG PO CAPS
25.00 | ORAL_CAPSULE | ORAL | Status: DC
Start: ? — End: 2020-01-09

## 2020-01-09 MED ORDER — ACETAMINOPHEN 500 MG PO TABS
500.00 | ORAL_TABLET | ORAL | Status: DC
Start: ? — End: 2020-01-09

## 2020-01-09 MED ORDER — PANTOPRAZOLE SODIUM 40 MG IV SOLR
40.00 | INTRAVENOUS | Status: DC
Start: 2020-01-09 — End: 2020-01-09

## 2020-01-09 MED ORDER — MAGNESIUM OXIDE 400 MG PO TABS
800.00 | ORAL_TABLET | ORAL | Status: DC
Start: 2020-01-09 — End: 2020-01-09

## 2020-01-09 MED ORDER — INSULIN LISPRO 100 UNIT/ML ~~LOC~~ SOLN
2.00 | SUBCUTANEOUS | Status: DC
Start: 2020-01-09 — End: 2020-01-09

## 2020-01-09 MED ORDER — POLYSACCHARIDE IRON COMPLEX 150 MG PO CAPS
150.00 | ORAL_CAPSULE | ORAL | Status: DC
Start: ? — End: 2020-01-09

## 2020-01-09 MED ORDER — INSULIN GLARGINE 100 UNIT/ML ~~LOC~~ SOLN
20.00 | SUBCUTANEOUS | Status: DC
Start: 2020-01-10 — End: 2020-01-09

## 2020-01-09 MED ORDER — DEXTROSE 10 % IV SOLN
125.00 | INTRAVENOUS | Status: DC
Start: ? — End: 2020-01-09

## 2020-01-11 ENCOUNTER — Ambulatory Visit: Payer: BC Managed Care – PPO | Attending: Internal Medicine

## 2020-01-11 DIAGNOSIS — Z23 Encounter for immunization: Secondary | ICD-10-CM | POA: Insufficient documentation

## 2020-01-11 NOTE — Progress Notes (Signed)
   Covid-19 Vaccination Clinic  Name:  Chad Cox    MRN: [id number] DOB: 17-Aug-1951  01/11/2020  Chad Cox was observed post Covid-19 immunization for 15 minutes without incident. He was provided with Vaccine Information Sheet and instruction to access the V-Safe system.   Chad Cox was instructed to call 911 with any severe reactions post vaccine: Marland Kitchen Difficulty breathing  . Swelling of face and throat  . A fast heartbeat  . A bad rash all over body  . Dizziness and weakness   Immunizations Administered    Name Date Dose VIS Date Route   Pfizer COVID-19 Vaccine 01/11/2020  6:47 AM 0.3 mL 10/19/2019 Intramuscular   Manufacturer: [employer], [employer]   Lot: [id number]   NDC: [address]-2

## 2020-01-16 ENCOUNTER — Other Ambulatory Visit: Payer: Self-pay

## 2020-01-16 NOTE — Progress Notes (Addendum)
[place] [facility] [address], [place], [place] [address] [phone]  Date:  01/17/2020   Name:  Chad Cox   DOB:  1950-11-30   MRN:  [id number]  PCP:  Darreld Mclean, MD    Chief Complaint: Lab Work (hemaglobin check, hospital follow up from last week)   History of Present Illness:  Chad Cox is a 69 y.o. very pleasant male patient who presents with the following:  Patient with history of diabetes, hypertension, dyslipidemia He contacted me recently with rectal bleeding, ended up being admitted to [facility] with a GI bleed-see details of admission below Bleed felt to be diverticular in origin.  He needs a follow-up CBC today His baseline hemoglobin prior to admission was 13.7 At his last visit in February his A1c had gone up, we added Farxiga to his regimen; too early to recheck A1c today Lab Results  Component Value Date   HGBA1C 9.8 (H) 12/31/2019    Date of Admission: 01/07/2020  Date of Discharge: 01/09/2020  Admitting Attending: Karren Cobble, MD   Discharging Attending: Helyn App, PA-C; [facility], MD  Diagnoses:  Principal Problem: Lower GI bleed Active Problems: Hypomagnesemia Essential hypertension Hyperlipidemia Type II diabetes mellitus ([place]) GERD (gastroesophageal reflux disease) Acute blood loss anemia BPH (benign prostatic hyperplasia) Resolved Problems: Hypokalemia  HPI: Chad Cox is a 69 y.o. male with PMH including HTN, HLD, T2DM, GERD, and BPH who presented to [facility] ED on 01/07/2020 from home with rectal bleeding. Patient said that he had been in his normal state of health until the morning of admission when he noticed some maroon blood "the color of red velvet cake" on the toilet paper when wiping. He did not see any stool at that point. He then had the urge to defecate later in the day at which time he passed some soft brown stool mixed  with maroon blood. He called his PCP's office who advised him to go to the ED. Patient said that he had had intermittent constipation prior to this event but denied any other new symptoms like fever, chills, chest pain, SOB, abdominal pain, or N/V.   ED Course: In the ED, patient was afebrile and hemodynamically stable with good O2 sats on RA. WBC 11, Hgb 12.8, platelets 263. CMP unremarkable except for glucose 296. EKG showed sinus rhythm without evidence of acute ischemia. CT A/P with contrast showed "Colonic diverticulosis without focal diverticulitis. Additional chronic ancillary findings." Hospitalist service was consulted for admission.   Hospital Course: Patient was admitted for observation under Hospitalist service for evaluation and treatment of acute blood loss anemia due to LGIB. His home ASA was held and he was initially made NPO. His Hgb was trended and remained fairly stable ~11. Surgery was consulted who agreed that this bleeding was likely diverticular in nature given his known underlying diverticular disease and recommended conservative management. With clinical improvement, patient was started on a clear liquid diet and his diet was quickly advanced. Patient continued to have bloody BMs but the bleeding appeared to be slowing. His Hgb continued to drop slightly to 10, not warranting transfusion. Patient continued to be concerned about his ongoing bleeding so Surgery discussed the possibility of pursuing CTA plus or minus barium enema but it was felt that these studies would not be useful in identifying or treating a bleed that appeared to be resolving on its own. This was discussed with the patient  who agreed that the risk outweighed the benefits so conservative management was continued.   Recommendations: This afternoon, patient says he feels better and is comfortable going home. His VS are WNL and stable. His labs show mild acute blood loss anemia with Hgb of 10 but are otherwise WNL  and stable. Given this clinical improvement and stability, patient is ready for discharge today, 01/09/2020. He will be discharged home with a prescription for daily iron supplements. He was instructed to continue to hold his aspirin until instructed to resume by his PCP.   Follow-up: Patient will need to see his PCP within 1 week for hospital f/u, health maintenance, and long-term management of his comorbidities, including HTN, T2DM, and GERD, which were also managed during this admission. There were no acute events related to these conditions during this hospital stay. Patient will continue home meds at discharge with the only changes made being to hold his aspirin until instructed to resume by his PCP and to start iron supplements. These iron supplements will likely not need to be a long-term addition following cessation of his rectal bleeding and improvement of his anemia. Patient's PCP should obtain a repeat CBC at follow-up to ensure continued stability/improvement of his Hgb. A copy of this note will be sent to PCP on file with [place] to ensure follow up and continuity of care. Patient will also need follow-up with GI or Surgery to schedule a colonoscopy in the near future.   He has not seen any more GI bleeding Bloody stools started the same day he went to the hospital  No vomiting No abd pain He may feel a little lightheaded when he stands up but this is not new   He is holding his baby aspirin right now- advised him to continue to hold this for now, perhaps start back in 6 months or after colonoscopy   He does note that he will tend to get a headache in the afternoon a couple times a week, typically uses Excedrin for this.  He admits to drinking a fair amount of caffeine, will have several cups of coffee in the morning and oftentimes soda in the afternoon with dinner.  We discussed gradually decreasing caffeine intake, as I think this may help reduce headaches Patient Active Problem List    Diagnosis Date Noted  . Kidney pain 01/11/2019  . Hematuria 01/11/2019  . Bile leak, postoperative 09/26/2018  . Gallstones and inflammation of gallbladder without obstruction 09/20/2018  . Cholelithiasis and cholecystitis without obstruction 09/20/2018  . Obesity, unspecified 04/29/2014  . Diabetes mellitus 01/30/2012  . Hypertension 01/30/2012  . Dyslipidemia 01/30/2012    Past Medical History:  Diagnosis Date  . Bile leak, postoperative 09/26/2018  . Cholecystitis   . Diabetes mellitus without complication (HCC)   . Gallstones and inflammation of gallbladder without obstruction 09/20/2018  . Hypertension   . Kidney stone     Past Surgical History:  Procedure Laterality Date  . CHOLECYSTECTOMY N/A 09/20/2018   Procedure: LAPAROSCOPIC CHOLECYSTECTOMY WITH PRIMARY UMBILICAL HERNIA REPAIR;  Surgeon: Claud Kelp, MD;  Location: [facility];  Service: General;  Laterality: N/A;  . ERCP N/A 09/21/2018   Procedure: ENDOSCOPIC RETROGRADE CHOLANGIOPANCREATOGRAPHY (ERCP);  Surgeon: Jeani Hawking, MD;  Location: [facility];  Service: Endoscopy;  Laterality: N/A;  . LAPAROSCOPY N/A 09/21/2018   Procedure: LAPAROSCOPY DIAGNOSTIC, WASHOUT, DRAIN PLACEMENT;  Surgeon: Romie Levee, MD;  Location: [facility];  Service: General;  Laterality: N/A;  . WISDOM TOOTH EXTRACTION  Social History   Tobacco Use  . Smoking status: Never Smoker  . Smokeless tobacco: Never Used  Substance Use Topics  . Alcohol use: Yes    Comment: occ  . Drug use: No    Family History  Problem Relation Age of Onset  . Atrial fibrillation Mother     Allergies  Allergen Reactions  . Adhesive [Tape] Other (See Comments)    "acts like poison ivy" blisters up, itching  . Betadine [Povidone Iodine] Other (See Comments)    Blisters, itching    Medication list has been reviewed and updated.  Current Outpatient Medications on File Prior to Visit  Medication Sig Dispense Refill  . acetaminophen (TYLENOL)  650 MG CR tablet Take 650 mg by mouth at bedtime.     Marland Kitchen aspirin 81 MG tablet Take 81 mg by mouth daily.    . calcium carbonate (TUMS - DOSED IN MG ELEMENTAL CALCIUM) 500 MG chewable tablet Chew 1 tablet by mouth every 8 (eight) hours as needed for indigestion or heartburn.    . cimetidine (TAGAMET) 200 MG tablet Take 200 mg by mouth every 8 (eight) hours as needed (acid reflux).    . dapagliflozin propanediol (FARXIGA) 10 MG TABS tablet Take 10 mg by mouth daily before breakfast. Start with 1/2 tablet for one week 30 tablet 6  . diphenhydrAMINE (BENADRYL) 25 mg capsule Take 25 mg by mouth at bedtime.    Marland Kitchen lisinopril (ZESTRIL) 20 MG tablet TAKE 1 TABLET(20 MG) BY MOUTH DAILY 90 tablet 3  . metFORMIN (GLUCOPHAGE) 1000 MG tablet Take 1 tablet (1,000 mg total) by mouth 2 (two) times daily with a meal. 180 tablet 3  . Multiple Vitamin (MULTIVITAMIN) tablet Take 1 tablet by mouth daily.    . naproxen sodium (ANAPROX) 220 MG tablet Take 2 tablets (440 mg total) by mouth 2 (two) times daily as needed (pain). 60 tablet 2  . pantoprazole (PROTONIX) 40 MG tablet Take 1 tablet (40 mg total) by mouth daily. 30 tablet 3  . pravastatin (PRAVACHOL) 80 MG tablet Take 1 tablet (80 mg total) by mouth daily. 90 tablet 3   No current facility-administered medications on file prior to visit.    Review of Systems:  As per HPI- otherwise negative.   Physical Examination: Vitals:   01/17/20 0911  BP: 119/75  Pulse: 85  Resp: 16  Temp: (!) 96.5 F (35.8 C)  SpO2: 98%   Vitals:   01/17/20 0911  Weight: 224 lb (101.6 kg)  Height: 6' (1.829 m)   Body mass index is 30.38 kg/m. Ideal Body Weight: Weight in (lb) to have BMI = 25: 183.9  GEN: no acute distress.  Tall build, obese-generally looks well HEENT: Atraumatic, Normocephalic.  Conjunctive appear normal to minimally pale in color Ears and Nose: No external deformity. CV: RRR, No M/G/R. No JVD. No thrill. No extra heart sounds. PULM: CTA B, no  wheezes, crackles, rhonchi. No retractions. No resp. distress. No accessory muscle use. ABD: S, NT, ND, +BS. No rebound. No HSM.  Belly is benign EXTR: No c/c/e PSYCH: Normally interactive. Conversant.    Assessment and Plan: Hospital discharge follow-up  Lower GI bleed - Plan: CBC, Basic metabolic panel, Ambulatory referral to Gastroenterology, Ferritin  Essential hypertension  Controlled type 2 diabetes mellitus without complication, without long-term current use of insulin (HCC)  Mixed hyperlipidemia  Following up today after hospitalization for a presumed diverticular lower GI bleed.  10 has not noted any further bleeding, he is  overall feeling well today.  We will check his CBC and iron level Too early to repeat A1c at this time Blood pressures under good control We discussed his headaches.  I advised him that it would be best to avoid aspirin for the next several weeks due to recent GI bleed.  He would do his best to not take any Excedrin We also discussed caffeine intake, I advised him to gradually decrease caffeine as I think this may reduce his overall headaches Will plan further follow- up pending labs.  Moderate medical decision making today  This visit occurred during the SARS-CoV-2 public health emergency.  Safety protocols were in place, including screening questions prior to the visit, additional usage of staff PPE, and extensive cleaning of exam room while observing appropriate contact time as indicated for disinfecting solutions.    Signed Abbe Amsterdam, MD  Received his labs as below-message to patient His most recent hemoglobin on March 3 was 10.0, crit 29 His iron is significantly low We will touch base with hematology to see if iron transfusion might be helpful  Results for orders placed or performed in visit on 01/17/20  CBC  Result Value Ref Range   WBC 7.3 4.0 - 10.5 K/uL   RBC 2.95 (L) 4.22 - 5.81 Mil/uL   Platelets 336.0 150.0 - 400.0 K/uL    Hemoglobin 10.1 (L) 13.0 - 17.0 g/dL   HCT 62.8 (L) 36.6 - 29.4 %   MCV 97.1 78.0 - 100.0 fl   MCHC 35.4 30.0 - 36.0 g/dL   RDW 76.5 46.5 - 03.5 %  Basic metabolic panel  Result Value Ref Range   Sodium 136 135 - 145 mEq/L   Potassium 3.8 3.5 - 5.1 mEq/L   Chloride 102 96 - 112 mEq/L   CO2 24 19 - 32 mEq/L   Glucose, Bld 305 (H) 70 - 99 mg/dL   BUN 11 6 - 23 mg/dL   Creatinine, Ser 4.65 0.40 - 1.50 mg/dL   GFR 68.12 >75.17 mL/min   Calcium 8.8 8.4 - 10.5 mg/dL  Ferritin  Result Value Ref Range   Ferritin 14.8 (L) 22.0 - 322.0 ng/mL

## 2020-01-17 ENCOUNTER — Other Ambulatory Visit: Payer: Self-pay

## 2020-01-17 ENCOUNTER — Encounter: Payer: Self-pay | Admitting: Family Medicine

## 2020-01-17 ENCOUNTER — Ambulatory Visit: Payer: BC Managed Care – PPO | Admitting: Family Medicine

## 2020-01-17 ENCOUNTER — Other Ambulatory Visit: Payer: Self-pay | Admitting: Family Medicine

## 2020-01-17 VITALS — BP 119/75 | HR 85 | Temp 96.5°F | Resp 16 | Ht 72.0 in | Wt 224.0 lb

## 2020-01-17 DIAGNOSIS — I10 Essential (primary) hypertension: Secondary | ICD-10-CM | POA: Diagnosis not present

## 2020-01-17 DIAGNOSIS — E119 Type 2 diabetes mellitus without complications: Secondary | ICD-10-CM

## 2020-01-17 DIAGNOSIS — E782 Mixed hyperlipidemia: Secondary | ICD-10-CM

## 2020-01-17 DIAGNOSIS — K922 Gastrointestinal hemorrhage, unspecified: Secondary | ICD-10-CM

## 2020-01-17 DIAGNOSIS — Z09 Encounter for follow-up examination after completed treatment for conditions other than malignant neoplasm: Secondary | ICD-10-CM | POA: Diagnosis not present

## 2020-01-17 LAB — FERRITIN: Ferritin: 14.8 ng/mL — ABNORMAL LOW (ref 22.0–322.0)

## 2020-01-17 LAB — CBC
HCT: 28.6 % — ABNORMAL LOW (ref 39.0–52.0)
Hemoglobin: 10.1 g/dL — ABNORMAL LOW (ref 13.0–17.0)
MCHC: 35.4 g/dL (ref 30.0–36.0)
MCV: 97.1 fl (ref 78.0–100.0)
Platelets: 336 10*3/uL (ref 150.0–400.0)
RBC: 2.95 Mil/uL — ABNORMAL LOW (ref 4.22–5.81)
RDW: 13.9 % (ref 11.5–15.5)
WBC: 7.3 10*3/uL (ref 4.0–10.5)

## 2020-01-17 LAB — BASIC METABOLIC PANEL
BUN: 11 mg/dL (ref 6–23)
CO2: 24 mEq/L (ref 19–32)
Calcium: 8.8 mg/dL (ref 8.4–10.5)
Chloride: 102 mEq/L (ref 96–112)
Creatinine, Ser: 0.99 mg/dL (ref 0.40–1.50)
GFR: 75.15 mL/min (ref >60.00)
Glucose, Bld: 305 mg/dL — ABNORMAL HIGH (ref 70–99)
Potassium: 3.8 mEq/L (ref 3.5–5.1)
Sodium: 136 mEq/L (ref 135–145)

## 2020-01-17 NOTE — Patient Instructions (Addendum)
I will be in touch with your labs asap- try to avoid aspirin for the next couple of months at least Let me know if any further evidence of GI bleeding or other concerns I will get you set back up with your GI doc Dr Loreta Ave for an elective colonoscopy

## 2020-01-21 ENCOUNTER — Telehealth: Payer: Self-pay | Admitting: Hematology & Oncology

## 2020-01-21 NOTE — Telephone Encounter (Signed)
lmom to inform patient of new patient appt 3/31 at 130 pm

## 2020-01-22 NOTE — Telephone Encounter (Signed)
If needed ill be happy to schedule him

## 2020-01-30 ENCOUNTER — Other Ambulatory Visit (INDEPENDENT_AMBULATORY_CARE_PROVIDER_SITE_OTHER): Payer: BC Managed Care – PPO

## 2020-01-30 ENCOUNTER — Other Ambulatory Visit: Payer: Self-pay

## 2020-01-30 ENCOUNTER — Encounter: Payer: Self-pay | Admitting: Family Medicine

## 2020-01-30 DIAGNOSIS — K922 Gastrointestinal hemorrhage, unspecified: Secondary | ICD-10-CM

## 2020-01-30 LAB — CBC
HCT: 28.6 % — ABNORMAL LOW (ref 39.0–52.0)
Hemoglobin: 9.9 g/dL — ABNORMAL LOW (ref 13.0–17.0)
MCHC: 34.8 g/dL (ref 30.0–36.0)
MCV: 94.9 fl (ref 78.0–100.0)
Platelets: 284 10*3/uL (ref 150.0–400.0)
RBC: 3.01 Mil/uL — ABNORMAL LOW (ref 4.22–5.81)
RDW: 12.8 % (ref 11.5–15.5)
WBC: 6.1 10*3/uL (ref 4.0–10.5)

## 2020-02-06 ENCOUNTER — Inpatient Hospital Stay: Payer: BC Managed Care – PPO

## 2020-02-06 ENCOUNTER — Other Ambulatory Visit: Payer: Self-pay

## 2020-02-06 ENCOUNTER — Encounter: Payer: Self-pay | Admitting: Hematology & Oncology

## 2020-02-06 ENCOUNTER — Inpatient Hospital Stay: Payer: BC Managed Care – PPO | Attending: Hematology & Oncology | Admitting: Hematology & Oncology

## 2020-02-06 VITALS — BP 133/67 | HR 80 | Temp 97.3°F | Resp 20 | Ht 72.0 in | Wt 223.0 lb

## 2020-02-06 DIAGNOSIS — D509 Iron deficiency anemia, unspecified: Secondary | ICD-10-CM | POA: Insufficient documentation

## 2020-02-06 DIAGNOSIS — K5900 Constipation, unspecified: Secondary | ICD-10-CM | POA: Insufficient documentation

## 2020-02-06 DIAGNOSIS — Z79899 Other long term (current) drug therapy: Secondary | ICD-10-CM | POA: Insufficient documentation

## 2020-02-06 DIAGNOSIS — K5733 Diverticulitis of large intestine without perforation or abscess with bleeding: Secondary | ICD-10-CM | POA: Diagnosis not present

## 2020-02-06 DIAGNOSIS — E611 Iron deficiency: Secondary | ICD-10-CM | POA: Insufficient documentation

## 2020-02-06 DIAGNOSIS — I10 Essential (primary) hypertension: Secondary | ICD-10-CM | POA: Diagnosis not present

## 2020-02-06 DIAGNOSIS — E119 Type 2 diabetes mellitus without complications: Secondary | ICD-10-CM | POA: Insufficient documentation

## 2020-02-06 DIAGNOSIS — Z7984 Long term (current) use of oral hypoglycemic drugs: Secondary | ICD-10-CM | POA: Diagnosis not present

## 2020-02-06 DIAGNOSIS — D500 Iron deficiency anemia secondary to blood loss (chronic): Secondary | ICD-10-CM

## 2020-02-06 HISTORY — DX: Iron deficiency anemia secondary to blood loss (chronic): D50.0

## 2020-02-06 HISTORY — DX: Diverticulitis of large intestine without perforation or abscess with bleeding: K57.33

## 2020-02-06 LAB — CMP (CANCER CENTER ONLY)
ALT: 13 U/L (ref 0–44)
AST: 11 U/L — ABNORMAL LOW (ref 15–41)
Albumin: 4.7 g/dL (ref 3.5–5.0)
Alkaline Phosphatase: 58 U/L (ref 38–126)
Anion gap: 11 (ref 5–15)
BUN: 14 mg/dL (ref 8–23)
CO2: 25 mmol/L (ref 22–32)
Calcium: 9.7 mg/dL (ref 8.9–10.3)
Chloride: 103 mmol/L (ref 98–111)
Creatinine: 1.06 mg/dL (ref 0.61–1.24)
GFR, Est AFR Am: >60 mL/min (ref >60)
GFR, Estimated: >60 mL/min (ref >60)
Glucose, Bld: 241 mg/dL — ABNORMAL HIGH (ref 70–99)
Potassium: 3.7 mmol/L (ref 3.5–5.1)
Sodium: 139 mmol/L (ref 135–145)
Total Bilirubin: 0.4 mg/dL (ref 0.3–1.2)
Total Protein: 7.1 g/dL (ref 6.5–8.1)

## 2020-02-06 LAB — CBC WITH DIFFERENTIAL (CANCER CENTER ONLY)
Abs Immature Granulocytes: 0.07 10*3/uL (ref 0.00–0.07)
Basophils Absolute: 0 10*3/uL (ref 0.0–0.1)
Basophils Relative: 1 %
Eosinophils Absolute: 0.3 10*3/uL (ref 0.0–0.5)
Eosinophils Relative: 4 %
HCT: 31.1 % — ABNORMAL LOW (ref 39.0–52.0)
Hemoglobin: 10.1 g/dL — ABNORMAL LOW (ref 13.0–17.0)
Immature Granulocytes: 1 %
Lymphocytes Relative: 14 %
Lymphs Abs: 1 10*3/uL (ref 0.7–4.0)
MCH: 30.7 pg (ref 26.0–34.0)
MCHC: 32.5 g/dL (ref 30.0–36.0)
MCV: 94.5 fL (ref 80.0–100.0)
Monocytes Absolute: 0.5 10*3/uL (ref 0.1–1.0)
Monocytes Relative: 7 %
Neutro Abs: 5.4 10*3/uL (ref 1.7–7.7)
Neutrophils Relative %: 73 %
Platelet Count: 303 10*3/uL (ref 150–400)
RBC: 3.29 MIL/uL — ABNORMAL LOW (ref 4.22–5.81)
RDW: 12.2 % (ref 11.5–15.5)
WBC Count: 7.4 10*3/uL (ref 4.0–10.5)
nRBC: 0 % (ref 0.0–0.2)

## 2020-02-06 LAB — SAVE SMEAR(SSMR), FOR PROVIDER SLIDE REVIEW

## 2020-02-06 LAB — RETICULOCYTES
Immature Retic Fract: 20.5 % — ABNORMAL HIGH (ref 2.3–15.9)
RBC.: 3.26 MIL/uL — ABNORMAL LOW (ref 4.22–5.81)
Retic Count, Absolute: 52.2 10*3/uL (ref 19.0–186.0)
Retic Ct Pct: 1.6 % (ref 0.4–3.1)

## 2020-02-06 NOTE — Progress Notes (Signed)
Referral MD  Reason for Referral: Iron deficiency secondary to diverticular bleed  Chief Complaint  Patient presents with  . New Patient (Initial Visit)    I think I need Iron.  : I had bleeding from my rectum.  HPI: Mr. Chad Cox is a very nice 69 year old gentleman.  He is visiting from [place].  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  He does have diabetes.  He is not on insulin.  He has had diabetes for about 10 years.  He has had his gallbladder out.  He apparently had complications from this.  He said he was in the hospital for about a week.  Recently, he started to have bright red blood per rectum.  He has not had this before.  He had to be hospitalized.  It was felt that he had a bleed from a diverticulum.  I think he was hospitalized down in [place].  He was in the hospital for a couple days.  He did not require a colonoscopy.  I do not think he was transfused.  He was seen by surgery.  However, they felt that this was from a diverticulum and that he did not require any surgical intervention.  He is followed by Dr. Patsy Lager.  She checked a ferritin back in early March.  The ferritin was 15.  Because of this, she felt that he might need some IV iron.  He does feel little bit tired.  He is still working.  He has had no issues in the family with respect to bleeding although I think his mother had a diverticular bleed.  There is been no problems with taking aspirin.  He is on no blood thinner.  He has had no rashes.  He has had no leg swelling.  He has had no nausea or vomiting.  He has had no cough or shortness of breath.  He does not smoke cigarettes  He really does not have any alcoholic beverages.  Currently, his performance status is ECOG 1.     Past Medical History:  Diagnosis Date  . Bile leak, postoperative 09/26/2018  . Cholecystitis   . Diabetes mellitus without complication (HCC)   . Diverticulitis of colon with  bleeding 02/06/2020  . Gallstones and inflammation of gallbladder without obstruction 09/20/2018  . Hypertension   . Iron deficiency anemia due to chronic blood loss 02/06/2020  . Kidney stone   :  Past Surgical History:  Procedure Laterality Date  . CHOLECYSTECTOMY N/A 09/20/2018   Procedure: LAPAROSCOPIC CHOLECYSTECTOMY WITH PRIMARY UMBILICAL HERNIA REPAIR;  Surgeon: Claud Kelp, MD;  Location: [facility];  Service: General;  Laterality: N/A;  . ERCP N/A 09/21/2018   Procedure: ENDOSCOPIC RETROGRADE CHOLANGIOPANCREATOGRAPHY (ERCP);  Surgeon: Jeani Hawking, MD;  Location: [facility];  Service: Endoscopy;  Laterality: N/A;  . LAPAROSCOPY N/A 09/21/2018   Procedure: LAPAROSCOPY DIAGNOSTIC, WASHOUT, DRAIN PLACEMENT;  Surgeon: Romie Levee, MD;  Location: [facility];  Service: General;  Laterality: N/A;  . WISDOM TOOTH EXTRACTION    :   Current Outpatient Medications:  .  acetaminophen (TYLENOL) 650 MG CR tablet, Take 650 mg by mouth at bedtime. , Disp: , Rfl:  .  dapagliflozin propanediol (FARXIGA) 10 MG TABS tablet, Take 10 mg by mouth daily before breakfast. Start with 1/2 tablet for one week, Disp: 30 tablet, Rfl: 6 .  diphenhydrAMINE (BENADRYL) 25 mg capsule, Take 25 mg by mouth at bedtime., Disp: , Rfl:  .  Fe Bisgly-Succ-C-Thre-B12-FA (IRON-150  PO), Take by mouth., Disp: , Rfl:  .  lisinopril (ZESTRIL) 20 MG tablet, TAKE 1 TABLET(20 MG) BY MOUTH DAILY, Disp: 90 tablet, Rfl: 3 .  metFORMIN (GLUCOPHAGE) 1000 MG tablet, Take 1 tablet (1,000 mg total) by mouth 2 (two) times daily with a meal., Disp: 180 tablet, Rfl: 3 .  Multiple Vitamin (MULTIVITAMIN) tablet, Take 1 tablet by mouth daily., Disp: , Rfl:  .  pantoprazole (PROTONIX) 40 MG tablet, Take 1 tablet (40 mg total) by mouth daily., Disp: 30 tablet, Rfl: 3 .  pravastatin (PRAVACHOL) 80 MG tablet, Take 1 tablet (80 mg total) by mouth daily., Disp: 90 tablet, Rfl: 3 .  calcium carbonate (TUMS - DOSED IN MG ELEMENTAL CALCIUM) 500 MG  chewable tablet, Chew 1 tablet by mouth every 8 (eight) hours as needed for indigestion or heartburn., Disp: , Rfl:  .  cimetidine (TAGAMET) 200 MG tablet, Take 200 mg by mouth every 8 (eight) hours as needed (acid reflux)., Disp: , Rfl:  .  naproxen sodium (ANAPROX) 220 MG tablet, Take 2 tablets (440 mg total) by mouth 2 (two) times daily as needed (pain). (Patient not taking: Reported on 02/06/2020), Disp: 60 tablet, Rfl: 2:  :  Allergies  Allergen Reactions  . Adhesive [Tape] Other (See Comments)    "acts like poison ivy" blisters up, itching  . Betadine [Povidone Iodine] Other (See Comments)    Blisters, itching  :  Family History  Problem Relation Age of Onset  . Atrial fibrillation Mother   :  Social History   Socioeconomic History  . Marital status: Single    Spouse name: Not on file  . Number of children: Not on file  . Years of education: Not on file  . Highest education level: Not on file  Occupational History  . Not on file  Tobacco Use  . Smoking status: Never Smoker  . Smokeless tobacco: Never Used  Substance and Sexual Activity  . Alcohol use: Yes    Comment: occ  . Drug use: No  . Sexual activity: Never  Other Topics Concern  . Not on file  Social History Narrative  . Not on file   Social Determinants of Health   Financial Resource Strain:   . Difficulty of Paying Living Expenses:   Food Insecurity:   . Worried About Programme researcher, broadcasting/film/video in the Last Year:   . Barista in the Last Year:   Transportation Needs:   . Freight forwarder (Medical):   Marland Kitchen Lack of Transportation (Non-Medical):   Physical Activity:   . Days of Exercise per Week:   . Minutes of Exercise per Session:   Stress:   . Feeling of Stress :   Social Connections:   . Frequency of Communication with Friends and Family:   . Frequency of Social Gatherings with Friends and Family:   . Attends Religious Services:   . Active Member of Clubs or Organizations:   . Attends Occupational hygienist Meetings:   Marland Kitchen Marital Status:   Intimate Partner Violence:   . Fear of Current or Ex-Partner:   . Emotionally Abused:   Marland Kitchen Physically Abused:   . Sexually Abused:   :  Review of Systems  Constitutional: Negative.   HENT: Negative.   Eyes: Negative.   Respiratory: Negative.   Cardiovascular: Negative.   Gastrointestinal: Negative.   Genitourinary: Negative.   Musculoskeletal: Negative.   Skin: Negative.   Neurological: Negative.   Endo/Heme/Allergies: Negative.  Psychiatric/Behavioral: Negative.      Exam:   Physical Exam Vitals reviewed.  HENT:     Head: Normocephalic and atraumatic.  Eyes:     Pupils: Pupils are equal, round, and reactive to light.  Cardiovascular:     Rate and Rhythm: Normal rate and regular rhythm.     Heart sounds: Normal heart sounds.  Pulmonary:     Effort: Pulmonary effort is normal.     Breath sounds: Normal breath sounds.  Abdominal:     General: Bowel sounds are normal.     Palpations: Abdomen is soft.  Musculoskeletal:        General: No tenderness or deformity. Normal range of motion.     Cervical back: Normal range of motion.  Lymphadenopathy:     Cervical: No cervical adenopathy.  Skin:    General: Skin is warm and dry.     Findings: No erythema or rash.  Neurological:     Mental Status: He is alert and oriented to person, place, and time.  Psychiatric:        Behavior: Behavior normal.        Thought Content: Thought content normal.        Judgment: Judgment normal.     @IPVITALS @   Recent Labs    02/06/20 1339  WBC 7.4  HGB 10.1*  HCT 31.1*  PLT 303   Recent Labs    02/06/20 1339  NA 139  K 3.7  CL 103  CO2 25  GLUCOSE 241*  BUN 14  CREATININE 1.06  CALCIUM 9.7    Blood smear review: Normochromic and normocytic population of red blood cells.  He has some mild anisocytosis.  He has no nucleated red blood cells.  I see no teardrop cells.  He has no rouleaux formation.  White blood cells  appear normal in morphology and maturation.  There is no immature white blood cells.  I see no hypersegmented polys.  Platelets are adequate number and size.  Pathology: None    Assessment and Plan: Mr. Abebe is a very nice 69 year old white male.  He has history of diverticular bleed.  He has had some iron deficiency.  Of note, he is taking some oral iron.  He says he has constipation from this.  It is hard to say how much this he is absorbing because he is on Protonix.  We will have to see what his iron studies look like.  This will certainly be critical.  We may have to go ahead and give him some IV iron.  He is very nice.  He is incredibly interesting to talk to.  I spent about 45 minutes with him.  We will see what his iron studies show.  This really will dictate when he comes back to see [place].

## 2020-02-07 ENCOUNTER — Other Ambulatory Visit: Payer: Self-pay | Admitting: Family Medicine

## 2020-02-07 DIAGNOSIS — E119 Type 2 diabetes mellitus without complications: Secondary | ICD-10-CM

## 2020-02-07 LAB — FERRITIN: Ferritin: 6 ng/mL — ABNORMAL LOW (ref 24–336)

## 2020-02-07 LAB — IRON AND TIBC
Iron: 55 ug/dL (ref 42–163)
Saturation Ratios: 11 % — ABNORMAL LOW (ref 20–55)
TIBC: 479 ug/dL — ABNORMAL HIGH (ref 202–409)
UIBC: 424 ug/dL — ABNORMAL HIGH (ref 117–376)

## 2020-02-07 LAB — ERYTHROPOIETIN: Erythropoietin: 52.5 m[IU]/mL — ABNORMAL HIGH (ref 2.6–18.5)

## 2020-02-08 ENCOUNTER — Telehealth: Payer: Self-pay | Admitting: Hematology & Oncology

## 2020-02-08 LAB — HGB FRACTIONATION CASCADE
Hgb A2: 2.6 % (ref 1.8–3.2)
Hgb A: 97.4 % (ref 96.4–98.8)
Hgb F: 0 % (ref 0.0–2.0)
Hgb S: 0 %

## 2020-02-08 NOTE — Telephone Encounter (Signed)
Called and LMVm for patient regarding iron infusion appointments added per 4/1 result note

## 2020-02-11 ENCOUNTER — Other Ambulatory Visit: Payer: Self-pay | Admitting: Family

## 2020-02-12 ENCOUNTER — Other Ambulatory Visit: Payer: Self-pay

## 2020-02-12 ENCOUNTER — Ambulatory Visit: Payer: BC Managed Care – PPO | Attending: Internal Medicine

## 2020-02-12 ENCOUNTER — Inpatient Hospital Stay: Payer: BC Managed Care – PPO | Attending: Hematology & Oncology

## 2020-02-12 VITALS — BP 122/74 | HR 70 | Temp 97.5°F | Resp 18

## 2020-02-12 DIAGNOSIS — E611 Iron deficiency: Secondary | ICD-10-CM | POA: Insufficient documentation

## 2020-02-12 DIAGNOSIS — D500 Iron deficiency anemia secondary to blood loss (chronic): Secondary | ICD-10-CM

## 2020-02-12 DIAGNOSIS — Z23 Encounter for immunization: Secondary | ICD-10-CM

## 2020-02-12 MED ORDER — SODIUM CHLORIDE 0.9 % IV SOLN
200.0000 mg | Freq: Once | INTRAVENOUS | Status: AC
  Administered 2020-02-12: 200 mg via INTRAVENOUS
  Filled 2020-02-12: qty 200

## 2020-02-12 MED ORDER — SODIUM CHLORIDE 0.9 % IV SOLN
Freq: Once | INTRAVENOUS | Status: AC
  Filled 2020-02-12: qty 250

## 2020-02-12 NOTE — Patient Instructions (Signed)

## 2020-02-12 NOTE — Progress Notes (Signed)
   Covid-19 Vaccination Clinic  Name:  Chad Cox    MRN: [id number] DOB: [birth year]  02/12/2020  [name] was observed post Covid-19 immunization for 15 minutes without incident. He was provided with Vaccine Information Sheet and instruction to access the V-Safe system.   [name] was instructed to call 911 with any severe reactions post vaccine: Marland Kitchen Difficulty breathing  . Swelling of face and throat  . A fast heartbeat  . A bad rash all over body  . Dizziness and weakness   Immunizations Administered    Name Date Dose VIS Date Route   Pfizer COVID-19 Vaccine 02/12/2020  8:31 AM 0.3 mL 10/19/2019 Intramuscular   Manufacturer: [employer], [employer]   Lot: [id number]   NDC: [address]-3

## 2020-02-13 ENCOUNTER — Encounter: Payer: Self-pay | Admitting: Family Medicine

## 2020-02-19 ENCOUNTER — Other Ambulatory Visit: Payer: Self-pay

## 2020-02-19 ENCOUNTER — Inpatient Hospital Stay: Payer: BC Managed Care – PPO

## 2020-02-19 VITALS — BP 119/56 | HR 77 | Temp 97.8°F | Resp 16

## 2020-02-19 DIAGNOSIS — E611 Iron deficiency: Secondary | ICD-10-CM | POA: Diagnosis not present

## 2020-02-19 DIAGNOSIS — D500 Iron deficiency anemia secondary to blood loss (chronic): Secondary | ICD-10-CM

## 2020-02-19 MED ORDER — SODIUM CHLORIDE 0.9 % IV SOLN
Freq: Once | INTRAVENOUS | Status: AC
  Filled 2020-02-19: qty 250

## 2020-02-19 MED ORDER — SODIUM CHLORIDE 0.9 % IV SOLN
200.0000 mg | Freq: Once | INTRAVENOUS | Status: AC
  Administered 2020-02-19: 200 mg via INTRAVENOUS
  Filled 2020-02-19: qty 10

## 2020-02-19 NOTE — Patient Instructions (Signed)

## 2020-05-06 IMAGING — US US ABDOMEN LIMITED
1 series · 14 of 25 positions shown · non-contrast
Comparison: None.

CLINICAL DATA: 67-year-old male with a history of pain

EXAM:
ULTRASOUND ABDOMEN LIMITED RIGHT UPPER QUADRANT

[Series 1: us abdomen limited · 14 of 42 slices shown]
[im 1/42]
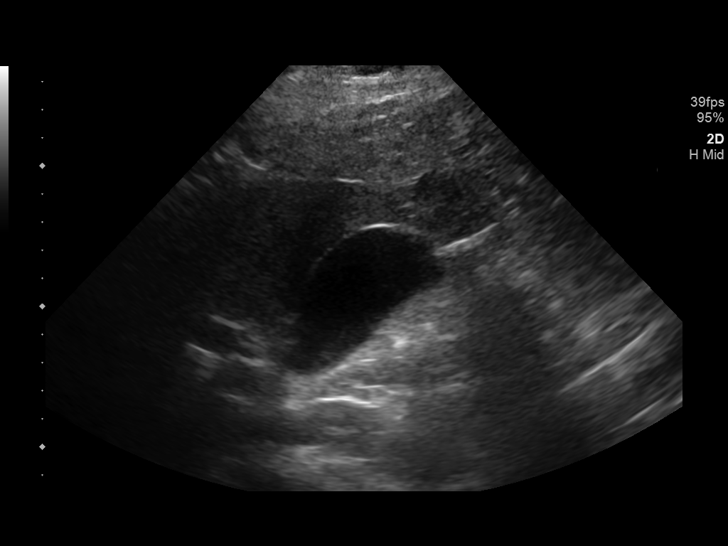
[im 4/42]
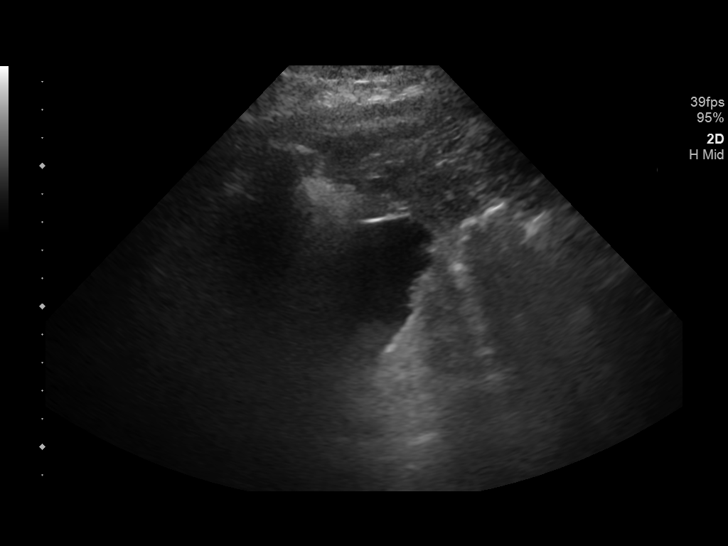
[im 7/42]
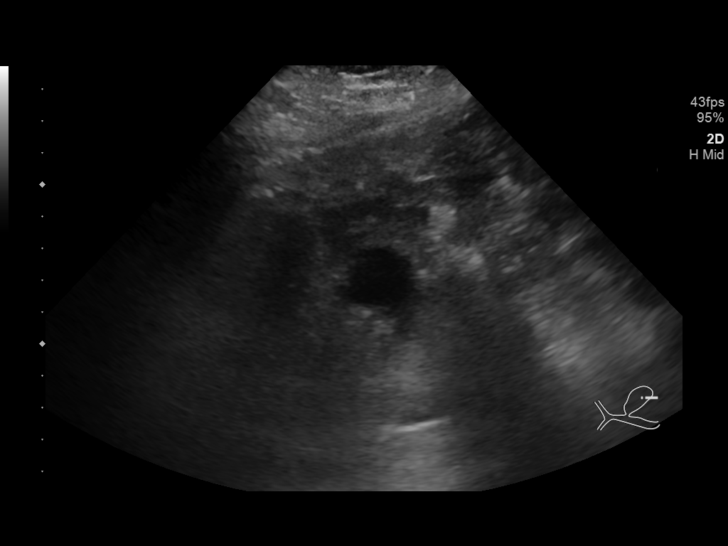
[im 11/42]
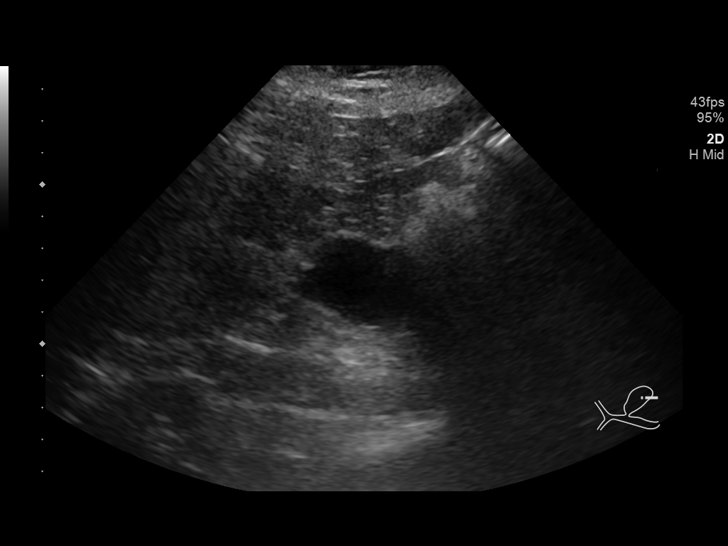
[im 14/42]
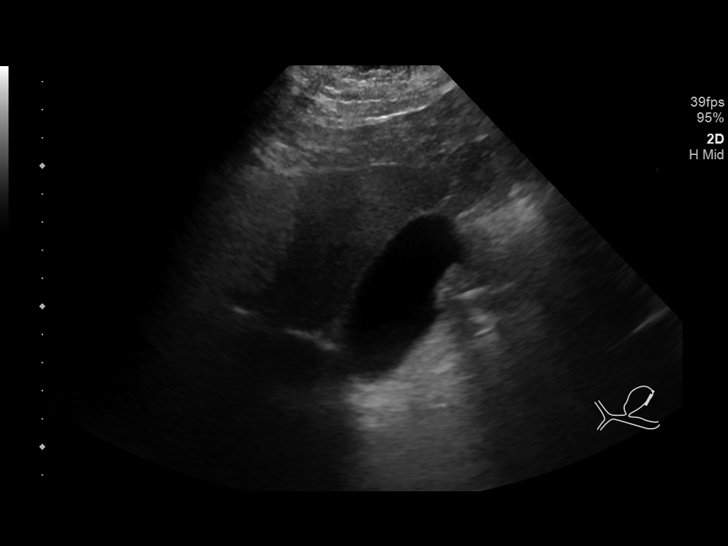
[im 16/42]
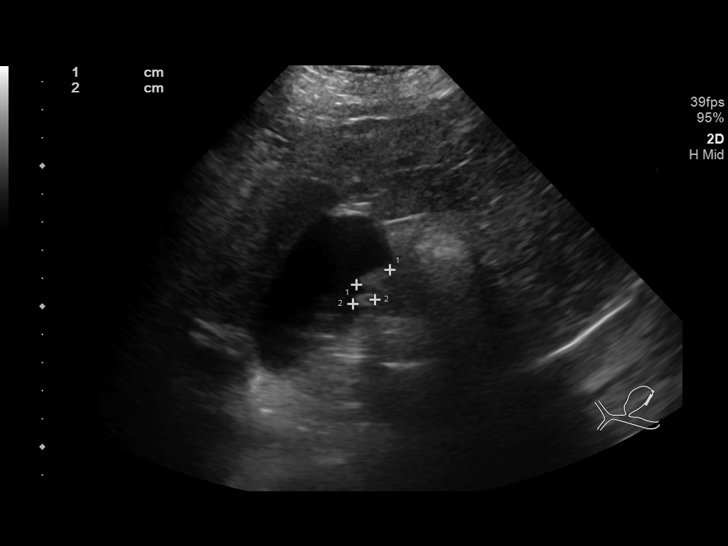
[im 19/42]
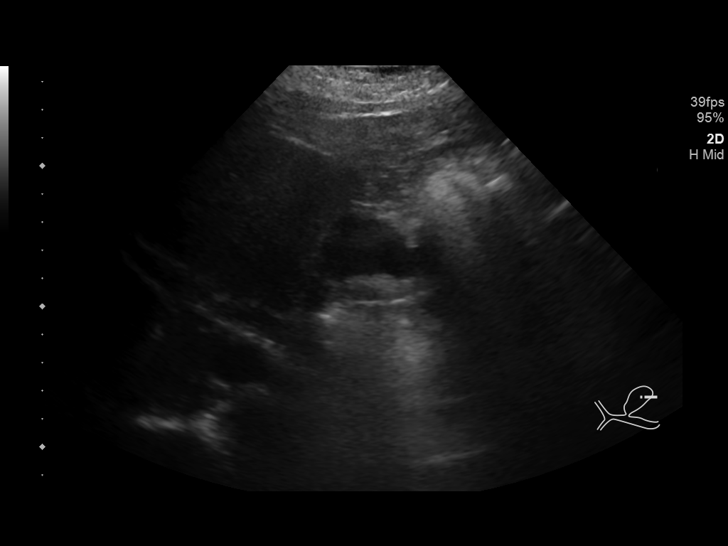
[im 23/42]
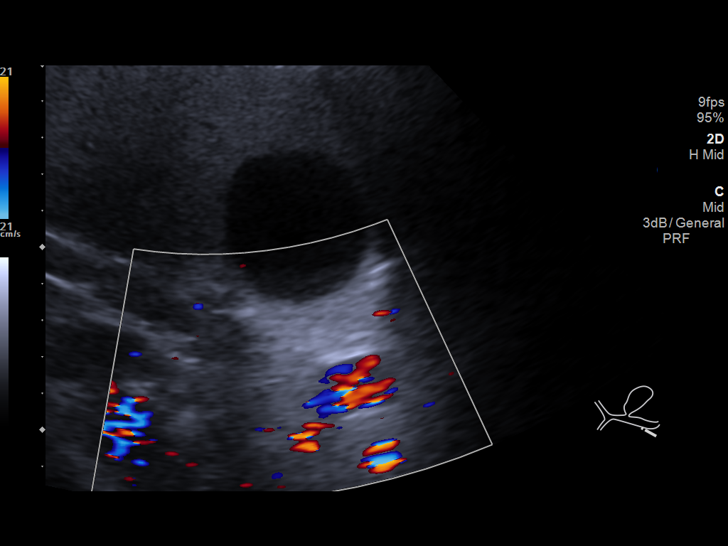
[im 26/42]
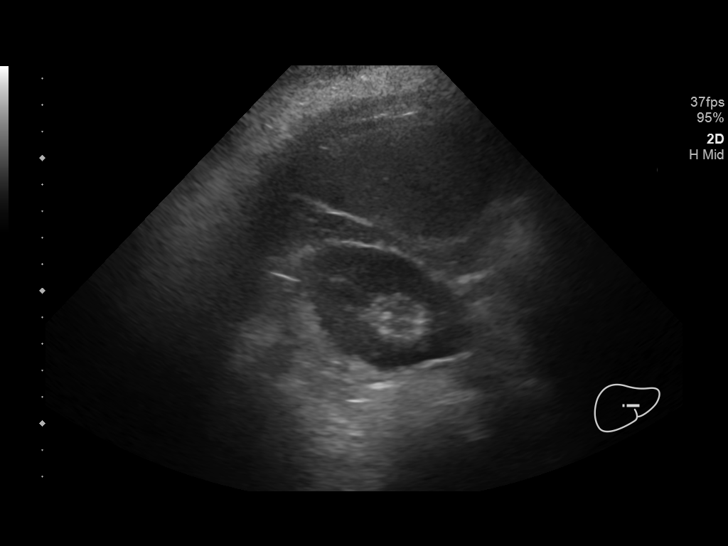
[im 28/42]
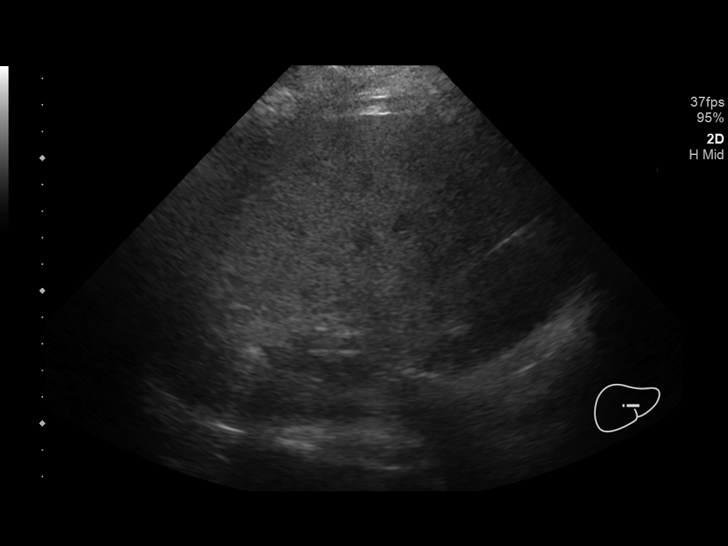
[im 31/42]
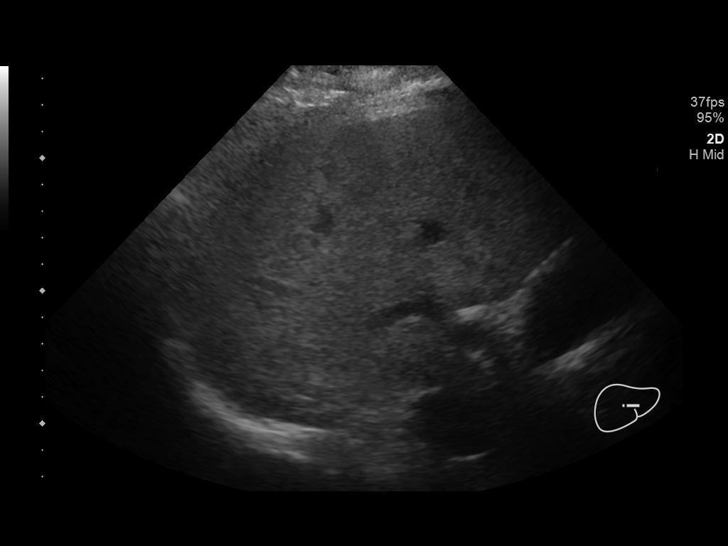
[im 35/42]
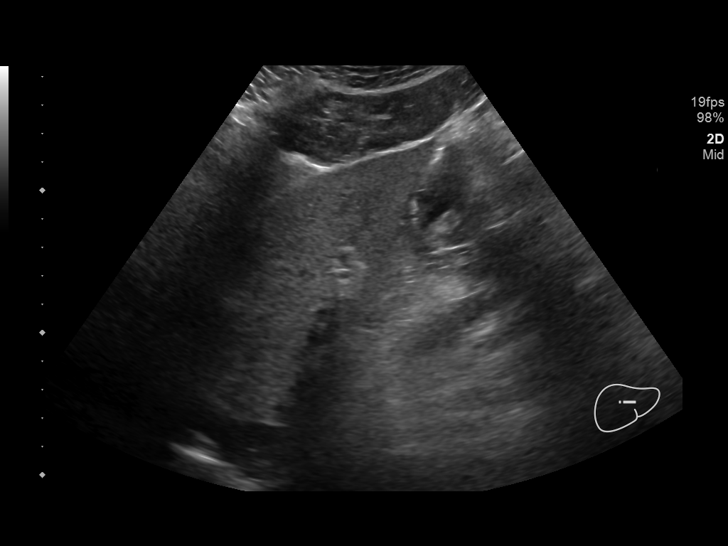
[im 38/42]
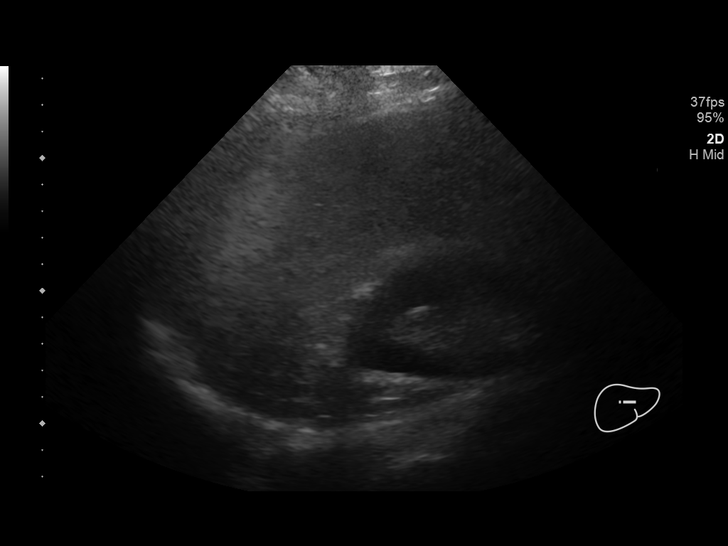
[im 42/42]
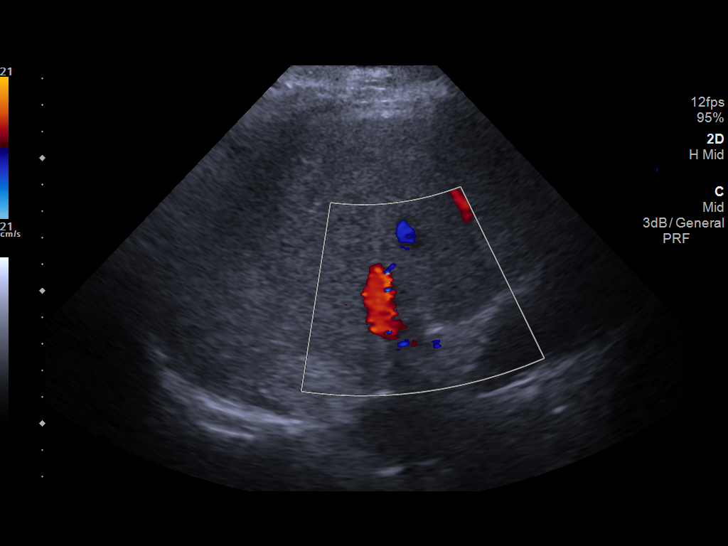

[14 of 25 positions shown; findings below may reference images not displayed]

FINDINGS: Gallbladder:

Echogenic debris in the dependent aspects of the gallbladder, with
largest focus measuring 1.3 cm. Negative sonographic Murphy's sign.
No gallbladder wall thickening or pericholecystic fluid.

Common bile duct:

Diameter: 3 mm-4 mm

Liver:

Heterogeneous echotexture of liver parenchyma. Portal vein is patent
on color Doppler imaging with normal direction of blood flow towards
the liver.
IMPRESSION: Cholelithiasis without sonographic evidence of acute cholecystitis.

Heterogeneous appearance of liver, potentially representing hepatic
steatosis

## 2020-05-13 ENCOUNTER — Other Ambulatory Visit: Payer: Self-pay

## 2020-05-13 MED ORDER — PANTOPRAZOLE SODIUM 40 MG PO TBEC: 40.0000 mg | DELAYED_RELEASE_TABLET | Freq: Every day | ORAL | 5 refills | Status: DC

## 2020-07-08 DIAGNOSIS — R072 Precordial pain: Secondary | ICD-10-CM | POA: Diagnosis not present

## 2020-07-08 DIAGNOSIS — R079 Chest pain, unspecified: Secondary | ICD-10-CM | POA: Diagnosis not present

## 2020-07-09 DIAGNOSIS — R079 Chest pain, unspecified: Secondary | ICD-10-CM | POA: Diagnosis not present

## 2020-07-21 DIAGNOSIS — R509 Fever, unspecified: Secondary | ICD-10-CM | POA: Diagnosis not present

## 2020-07-21 DIAGNOSIS — R519 Headache, unspecified: Secondary | ICD-10-CM | POA: Diagnosis not present

## 2020-08-01 DIAGNOSIS — Z20822 Contact with and (suspected) exposure to covid-19: Secondary | ICD-10-CM | POA: Diagnosis not present

## 2020-08-12 ENCOUNTER — Other Ambulatory Visit: Payer: Self-pay | Admitting: Family Medicine

## 2020-08-12 DIAGNOSIS — E119 Type 2 diabetes mellitus without complications: Secondary | ICD-10-CM

## 2020-08-20 DIAGNOSIS — Z23 Encounter for immunization: Secondary | ICD-10-CM | POA: Diagnosis not present
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
## 2020-08-25 NOTE — Patient Instructions (Addendum)
Good to see you again today!  I am sorry you have been in such a stressful situation, I know it hurts your heart  Please consider getting the shingles vaccine at your drug store if not done already Also I would encourage a covid 19 booster at your pharmacy I will be in touch with your A1c and CBC Please try flomax once a day for your urinary symptoms- let me know if helpful for you

## 2020-08-27 ENCOUNTER — Ambulatory Visit: Payer: BC Managed Care – PPO | Admitting: Family Medicine

## 2020-08-27 ENCOUNTER — Other Ambulatory Visit: Payer: Self-pay

## 2020-08-27 ENCOUNTER — Encounter: Payer: Self-pay | Admitting: Family Medicine

## 2020-08-27 VITALS — BP 110/62 | HR 80 | Temp 98.8°F | Resp 16 | Ht 72.0 in | Wt 224.2 lb

## 2020-08-27 DIAGNOSIS — N401 Enlarged prostate with lower urinary tract symptoms: Secondary | ICD-10-CM

## 2020-08-27 DIAGNOSIS — I10 Essential (primary) hypertension: Secondary | ICD-10-CM | POA: Diagnosis not present

## 2020-08-27 DIAGNOSIS — E782 Mixed hyperlipidemia: Secondary | ICD-10-CM | POA: Diagnosis not present

## 2020-08-27 DIAGNOSIS — K922 Gastrointestinal hemorrhage, unspecified: Secondary | ICD-10-CM

## 2020-08-27 DIAGNOSIS — E119 Type 2 diabetes mellitus without complications: Secondary | ICD-10-CM | POA: Diagnosis not present

## 2020-08-27 DIAGNOSIS — R350 Frequency of micturition: Secondary | ICD-10-CM

## 2020-08-27 MED ORDER — TAMSULOSIN HCL 0.4 MG PO CAPS: 0.4000 mg | ORAL_CAPSULE | Freq: Every day | ORAL | 3 refills | Status: DC

## 2020-08-28 ENCOUNTER — Encounter: Payer: Self-pay | Admitting: Family Medicine

## 2020-08-28 LAB — CBC
HCT: 38.8 % (ref 38.5–50.0)
Hemoglobin: 12.8 g/dL — ABNORMAL LOW (ref 13.2–17.1)
MCH: 30.9 pg (ref 27.0–33.0)
MCHC: 33 g/dL (ref 32.0–36.0)
MCV: 93.7 fL (ref 80.0–100.0)
MPV: 10.1 fL (ref 7.5–12.5)
Platelets: 296 10*3/uL (ref 140–400)
RBC: 4.14 10*6/uL — ABNORMAL LOW (ref 4.20–5.80)
RDW: 13.1 % (ref 11.0–15.0)
WBC: 6.2 10*3/uL (ref 3.8–10.8)

## 2020-08-28 LAB — HEMOGLOBIN A1C
Hgb A1c MFr Bld: 9.8 % of total Hgb — ABNORMAL HIGH (ref <5.7)
Mean Plasma Glucose: 235 (calc)
eAG (mmol/L): 13 (calc)

## 2020-08-30 ENCOUNTER — Emergency Department: Payer: BC Managed Care – PPO

## 2020-08-30 ENCOUNTER — Emergency Department
Admission: EM | Admit: 2020-08-30 | Discharge: 2020-08-30 | Disposition: A | Discharge status: Home / Self Care | Payer: BC Managed Care – PPO | Attending: Student in an Organized Health Care Education/Training Program | Admitting: Student in an Organized Health Care Education/Training Program

## 2020-08-30 ENCOUNTER — Other Ambulatory Visit: Payer: Self-pay

## 2020-08-30 DIAGNOSIS — R61 Generalized hyperhidrosis: Secondary | ICD-10-CM | POA: Diagnosis not present

## 2020-08-30 DIAGNOSIS — Z79899 Other long term (current) drug therapy: Secondary | ICD-10-CM | POA: Insufficient documentation

## 2020-08-30 DIAGNOSIS — I700 Atherosclerosis of aorta: Secondary | ICD-10-CM | POA: Diagnosis not present

## 2020-08-30 DIAGNOSIS — Z20822 Contact with and (suspected) exposure to covid-19: Secondary | ICD-10-CM | POA: Diagnosis not present

## 2020-08-30 DIAGNOSIS — R109 Unspecified abdominal pain: Secondary | ICD-10-CM | POA: Diagnosis not present

## 2020-08-30 DIAGNOSIS — E119 Type 2 diabetes mellitus without complications: Secondary | ICD-10-CM | POA: Insufficient documentation

## 2020-08-30 DIAGNOSIS — I10 Essential (primary) hypertension: Secondary | ICD-10-CM | POA: Insufficient documentation

## 2020-08-30 DIAGNOSIS — I959 Hypotension, unspecified: Secondary | ICD-10-CM | POA: Insufficient documentation

## 2020-08-30 DIAGNOSIS — Z9049 Acquired absence of other specified parts of digestive tract: Secondary | ICD-10-CM | POA: Diagnosis not present

## 2020-08-30 DIAGNOSIS — R0902 Hypoxemia: Secondary | ICD-10-CM | POA: Diagnosis not present

## 2020-08-30 DIAGNOSIS — M549 Dorsalgia, unspecified: Secondary | ICD-10-CM | POA: Insufficient documentation

## 2020-08-30 DIAGNOSIS — R55 Syncope and collapse: Secondary | ICD-10-CM | POA: Insufficient documentation

## 2020-08-30 DIAGNOSIS — E1165 Type 2 diabetes mellitus with hyperglycemia: Secondary | ICD-10-CM | POA: Diagnosis not present

## 2020-08-30 DIAGNOSIS — Z7984 Long term (current) use of oral hypoglycemic drugs: Secondary | ICD-10-CM | POA: Insufficient documentation

## 2020-08-30 LAB — BASIC METABOLIC PANEL
Anion gap: 11 (ref 5–15)
BUN: 14 mg/dL (ref 8–23)
CO2: 20 mmol/L — ABNORMAL LOW (ref 22–32)
Calcium: 8.5 mg/dL — ABNORMAL LOW (ref 8.9–10.3)
Chloride: 110 mmol/L (ref 98–111)
Creatinine, Ser: 1.16 mg/dL (ref 0.61–1.24)
GFR, Estimated: >60 mL/min (ref >60)
Glucose, Bld: 287 mg/dL — ABNORMAL HIGH (ref 70–99)
Potassium: 4.4 mmol/L (ref 3.5–5.1)
Sodium: 141 mmol/L (ref 135–145)

## 2020-08-30 LAB — HEPATIC FUNCTION PANEL
ALT: 75 U/L — ABNORMAL HIGH (ref 0–44)
AST: 51 U/L — ABNORMAL HIGH (ref 15–41)
Albumin: 3.8 g/dL (ref 3.5–5.0)
Alkaline Phosphatase: 166 U/L — ABNORMAL HIGH (ref 38–126)
Bilirubin, Direct: 0.3 mg/dL — ABNORMAL HIGH (ref 0.0–0.2)
Indirect Bilirubin: 0.7 mg/dL (ref 0.3–0.9)
Total Bilirubin: 1 mg/dL (ref 0.3–1.2)
Total Protein: 6.9 g/dL (ref 6.5–8.1)

## 2020-08-30 LAB — URINALYSIS, COMPLETE (UACMP) WITH MICROSCOPIC
Bacteria, UA: NONE SEEN
Bilirubin Urine: NEGATIVE
Glucose, UA: >=500 mg/dL — AB
Hgb urine dipstick: NEGATIVE
Ketones, ur: 5 mg/dL — AB
Leukocytes,Ua: NEGATIVE
Nitrite: NEGATIVE
Protein, ur: NEGATIVE mg/dL
Specific Gravity, Urine: 1.031 — ABNORMAL HIGH (ref 1.005–1.030)
Squamous Epithelial / HPF: NONE SEEN (ref 0–5)
pH: 5 (ref 5.0–8.0)

## 2020-08-30 LAB — CBC
HCT: 35.2 % — ABNORMAL LOW (ref 39.0–52.0)
Hemoglobin: 11.7 g/dL — ABNORMAL LOW (ref 13.0–17.0)
MCH: 31.3 pg (ref 26.0–34.0)
MCHC: 33.2 g/dL (ref 30.0–36.0)
MCV: 94.1 fL (ref 80.0–100.0)
Platelets: 253 10*3/uL (ref 150–400)
RBC: 3.74 MIL/uL — ABNORMAL LOW (ref 4.22–5.81)
RDW: 13.1 % (ref 11.5–15.5)
WBC: 10.7 10*3/uL — ABNORMAL HIGH (ref 4.0–10.5)
nRBC: 0 % (ref 0.0–0.2)

## 2020-08-30 LAB — RESPIRATORY PANEL BY RT PCR (FLU A&B, COVID)
Influenza A by PCR: NEGATIVE
Influenza B by PCR: NEGATIVE
SARS Coronavirus 2 by RT PCR: NEGATIVE

## 2020-08-30 LAB — TROPONIN I (HIGH SENSITIVITY)
Troponin I (High Sensitivity): 4 ng/L (ref <18)
Troponin I (High Sensitivity): 4 ng/L (ref <18)

## 2020-08-30 LAB — LIPASE, BLOOD: Lipase: 32 U/L (ref 11–51)

## 2020-08-30 MED ORDER — SODIUM CHLORIDE 0.9 % IV BOLUS
1000.0000 mL | Freq: Once | INTRAVENOUS | Status: AC
  Administered 2020-08-30: 1000 mL via INTRAVENOUS

## 2020-08-30 MED ORDER — IOHEXOL 350 MG/ML SOLN
100.0000 mL | Freq: Once | INTRAVENOUS | Status: AC | PRN
  Administered 2020-08-30: 100 mL via INTRAVENOUS

## 2020-08-30 NOTE — ED Notes (Signed)
Pt states "I am pissed right now. It is 8:30. All of my labs have been good and I am still sitting here." Pt informed of delay.

## 2020-08-30 NOTE — ED Notes (Signed)
Pt updated on delay. Pt states he is ready to be discharged. MD notified.

## 2020-08-30 NOTE — ED Provider Notes (Signed)
[facility] Emergency Department Provider Note    First MD Initiated Contact with Patient 08/30/20 1525     (approximate)  I have reviewed the triage vital signs and the nursing notes.   HISTORY  Chief Complaint Hypotension    HPI Chad Cox is a 69 y.o. male close past medical history presents to the ER for evaluation of near syncopal episode and significant hypotension.  Reportedly was an event today was sitting outside and feeling he is getting significantly overheated but started feeling clammy and like he was about to pass out.  Had sudden onset mid back pain he describes it is achy in nature.  No associated nausea or vomiting.  Has had issues with his back before but this felt different.  Denies any chest pain or pressure.  EMS found the patient was found to be orthostatic was given IV fluids.  Repeat was still hypotensive.  Denies any recent fevers.  Denies any cough or congestion.  denies hematochezia or melena.   Past Medical History:  Diagnosis Date  . Bile leak, postoperative 09/26/2018  . Cholecystitis   . Diabetes mellitus without complication (HCC)   . Diverticulitis of colon with bleeding 02/06/2020  . Gallstones and inflammation of gallbladder without obstruction 09/20/2018  . Hypertension   . Iron deficiency anemia due to chronic blood loss 02/06/2020  . Kidney stone    Family History  Problem Relation Age of Onset  . Atrial fibrillation Mother    Past Surgical History:  Procedure Laterality Date  . CHOLECYSTECTOMY N/A 09/20/2018   Procedure: LAPAROSCOPIC CHOLECYSTECTOMY WITH PRIMARY UMBILICAL HERNIA REPAIR;  Surgeon: Claud Kelp, MD;  Location: [facility];  Service: General;  Laterality: N/A;  . ERCP N/A 09/21/2018   Procedure: ENDOSCOPIC RETROGRADE CHOLANGIOPANCREATOGRAPHY (ERCP);  Surgeon: Jeani Hawking, MD;  Location: [facility];  Service: Endoscopy;  Laterality: N/A;  . LAPAROSCOPY N/A 09/21/2018   Procedure: LAPAROSCOPY  DIAGNOSTIC, WASHOUT, DRAIN PLACEMENT;  Surgeon: Romie Levee, MD;  Location: [facility];  Service: General;  Laterality: N/A;  . WISDOM TOOTH EXTRACTION     Patient Active Problem List   Diagnosis Date Noted  . Iron deficiency anemia due to chronic blood loss 02/06/2020  . Diverticulitis of colon with bleeding 02/06/2020  . Kidney pain 01/11/2019  . Hematuria 01/11/2019  . Bile leak, postoperative 09/26/2018  . Gallstones and inflammation of gallbladder without obstruction 09/20/2018  . Cholelithiasis and cholecystitis without obstruction 09/20/2018  . Obesity, unspecified 04/29/2014  . Diabetes mellitus 01/30/2012  . Hypertension 01/30/2012  . Dyslipidemia 01/30/2012      Prior to Admission medications   Medication Sig Start Date End Date Taking? Authorizing Provider  acetaminophen (TYLENOL) 650 MG CR tablet Take 650 mg by mouth at bedtime.    Yes [provider]  diphenhydrAMINE (BENADRYL) 25 mg capsule Take 25 mg by mouth at bedtime.   Yes [provider]  FARXIGA 10 MG TABS tablet TAKE 1 TABLET BY MOUTH DAILY BEFORE BREAKFAST. START WITH 1/2 TABLET FOR 1 WEEK Patient taking differently: Take 10 mg by mouth daily.  08/12/20  Yes Copland, Gwenlyn Found, MD  lisinopril (ZESTRIL) 20 MG tablet TAKE 1 TABLET(20 MG) BY MOUTH DAILY Patient taking differently: Take 20 mg by mouth daily.  07/18/19  Yes Copland, Gwenlyn Found, MD  metFORMIN (GLUCOPHAGE) 1000 MG tablet Take 1 tablet (1,000 mg total) by mouth 2 (two) times daily with a meal. 12/31/19  Yes Copland, Gwenlyn Found, MD  Multiple Vitamin (MULTIVITAMIN) tablet Take 1  tablet by mouth daily.   Yes [provider]  pantoprazole (PROTONIX) 40 MG tablet Take 1 tablet (40 mg total) by mouth daily. 05/13/20  Yes Copland, Gwenlyn FoundJessica C, MD  pravastatin (PRAVACHOL) 80 MG tablet Take 1 tablet (80 mg total) by mouth daily. 12/31/19  Yes Copland, Gwenlyn FoundJessica C, MD  tamsulosin (FLOMAX) 0.4 MG CAPS capsule Take 1 capsule (0.4 mg total) by mouth  daily. 08/27/20  Yes Copland, Gwenlyn FoundJessica C, MD    Allergies Adhesive [tape] and Betadine [povidone iodine]    Social History Social History   Tobacco Use  . Smoking status: Never Smoker  . Smokeless tobacco: Never Used  Substance Use Topics  . Alcohol use: Yes    Comment: occ  . Drug use: No    Review of Systems Patient denies headaches, rhinorrhea, blurry vision, numbness, shortness of breath, chest pain, edema, cough, abdominal pain, nausea, vomiting, diarrhea, dysuria, fevers, rashes or hallucinations unless otherwise stated above in HPI. ____________________________________________   PHYSICAL EXAM:  VITAL SIGNS: Vitals:   08/30/20 1929 08/30/20 1930  BP: 139/88 139/88  Pulse: 66 73  Resp: 18   Temp: 98 F (36.7 C)   SpO2: 98% 98%    Constitutional: Alert and oriented.  Eyes: Conjunctivae are normal.  Head: Atraumatic. Nose: No congestion/rhinnorhea. Mouth/Throat: Mucous membranes are moist.   Neck: No stridor. Painless ROM.  Cardiovascular: Normal rate, regular rhythm. Grossly normal heart sounds.  Good peripheral circulation. Respiratory: Normal respiratory effort.  No retractions. Lungs CTAB. Gastrointestinal: Soft and nontender. No distention. No abdominal bruits. No CVA tenderness. Genitourinary:  Musculoskeletal: No lower extremity tenderness nor edema.  No joint effusions. Neurologic:  Normal speech and language. No gross focal neurologic deficits are appreciated. No facial droop Skin:  Skin is warm, dry and intact. No rash noted. Psychiatric: Mood and affect are normal. Speech and behavior are normal.  ____________________________________________   LABS (all labs ordered are listed, but only abnormal results are displayed)  Results for orders placed or performed during the hospital encounter of 08/30/20 (from the past 24 hour(s))  Basic metabolic panel     Status: Abnormal   Collection Time: 08/30/20  3:29 PM  Result Value Ref Range   Sodium 141  135 - 145 mmol/L   Potassium 4.4 3.5 - 5.1 mmol/L   Chloride 110 98 - 111 mmol/L   CO2 20 (L) 22 - 32 mmol/L   Glucose, Bld 287 (H) 70 - 99 mg/dL   BUN 14 8 - 23 mg/dL   Creatinine, Ser 4.091.16 0.61 - 1.24 mg/dL   Calcium 8.5 (L) 8.9 - 10.3 mg/dL   GFR, Estimated >81>60 >19>60 mL/min   Anion gap 11 5 - 15  CBC     Status: Abnormal   Collection Time: 08/30/20  3:29 PM  Result Value Ref Range   WBC 10.7 (H) 4.0 - 10.5 K/uL   RBC 3.74 (L) 4.22 - 5.81 MIL/uL   Hemoglobin 11.7 (L) 13.0 - 17.0 g/dL   HCT 14.735.2 (L) 39 - 52 %   MCV 94.1 80.0 - 100.0 fL   MCH 31.3 26.0 - 34.0 pg   MCHC 33.2 30.0 - 36.0 g/dL   RDW 82.913.1 56.211.5 - 13.015.5 %   Platelets 253 150 - 400 K/uL   nRBC 0.0 0.0 - 0.2 %  Urinalysis, Complete w Microscopic     Status: Abnormal   Collection Time: 08/30/20  3:29 PM  Result Value Ref Range   Color, Urine YELLOW (A) YELLOW  APPearance CLEAR (A) CLEAR   Specific Gravity, Urine 1.031 (H) 1.005 - 1.030   pH 5.0 5.0 - 8.0   Glucose, UA >=500 (A) NEGATIVE mg/dL   Hgb urine dipstick NEGATIVE NEGATIVE   Bilirubin Urine NEGATIVE NEGATIVE   Ketones, ur 5 (A) NEGATIVE mg/dL   Protein, ur NEGATIVE NEGATIVE mg/dL   Nitrite NEGATIVE NEGATIVE   Leukocytes,Ua NEGATIVE NEGATIVE   RBC / HPF 0-5 0 - 5 RBC/hpf   WBC, UA 0-5 0 - 5 WBC/hpf   Bacteria, UA NONE SEEN NONE SEEN   Squamous Epithelial / LPF NONE SEEN 0 - 5  Troponin I (High Sensitivity)     Status: None   Collection Time: 08/30/20  3:29 PM  Result Value Ref Range   Troponin I (High Sensitivity) 4 <18 ng/L  Hepatic function panel     Status: Abnormal   Collection Time: 08/30/20  3:29 PM  Result Value Ref Range   Total Protein 6.9 6.5 - 8.1 g/dL   Albumin 3.8 3.5 - 5.0 g/dL   AST 51 (H) 15 - 41 U/L   ALT 75 (H) 0 - 44 U/L   Alkaline Phosphatase 166 (H) 38 - 126 U/L   Total Bilirubin 1.0 0.3 - 1.2 mg/dL   Bilirubin, Direct 0.3 (H) 0.0 - 0.2 mg/dL   Indirect Bilirubin 0.7 0.3 - 0.9 mg/dL  Lipase, blood     Status: None   Collection  Time: 08/30/20  3:29 PM  Result Value Ref Range   Lipase 32 11 - 51 U/L  Respiratory Panel by RT PCR (Flu A&B, Covid) - Nasopharyngeal Swab     Status: None   Collection Time: 08/30/20  5:14 PM   Specimen: Nasopharyngeal Swab  Result Value Ref Range   SARS Coronavirus 2 by RT PCR NEGATIVE NEGATIVE   Influenza A by PCR NEGATIVE NEGATIVE   Influenza B by PCR NEGATIVE NEGATIVE  Troponin I (High Sensitivity)     Status: None   Collection Time: 08/30/20  6:00 PM  Result Value Ref Range   Troponin I (High Sensitivity) 4 <18 ng/L   ____________________________________________  EKG My review and personal interpretation at Time: 15:27   Indication: hypotension  Rate: 80  Rhythm: sinus Axis: normal Other: normal intervals, no stemi ____________________________________________  RADIOLOGY  I personally reviewed all radiographic images ordered to evaluate for the above acute complaints and reviewed radiology reports and findings.  These findings were personally discussed with the patient.  Please see medical record for radiology report.  ____________________________________________   PROCEDURES  Procedure(s) performed:  Procedures    Critical Care performed: no ____________________________________________   INITIAL IMPRESSION / ASSESSMENT AND PLAN / ED COURSE  Pertinent labs & imaging results that were available during my care of the patient were reviewed by me and considered in my medical decision making (see chart for details).   DDX: Dehydration, electrolyte abnormality, ACS, dysrhythmia, orthostasis, anemia, dissection, AAA  Chad Cox is a 69 y.o. who presents to the ED with presentation as described above.  Patient with concerning presenting symptoms.  Has back pain and significant hypotension with syncopal episode.  EKG is nonischemic given his constellation symptoms I am concerned for AAA or dissection will order stat CTA.  Will order serial enzymes.  Will provide IV  fluids and placed on cardiac monitor.  Clinical Course as of Aug 31 2107  Sat Aug 30, 2020  1637 On my review the CTA I do not see any evidence of dissection or AAA.   [  PR]  1708 Work-up thus far is reassuring.  Mildly elevated LFTs.  Troponin initially negative.  Discussed the findings with the patient I did recommend admission to the hospital for further medical management and cardiac monitoring.  Patient reluctant to do this.  He is agreeing to stay for at least 1 repeat troponin.  Again I explained my concern for cardiac etiology of his presenting symptoms.  Demonstrates understanding.   [PR]  2105 Patient's blood pressure remained stable is not orthostatic at this time.  I do have a high suspicion this is related dehydration but given his age and risk factors dysrhythmia or cardiac source of his syncope still remains on the differential.  Fortunately no signs of ACS and his troponin is negative.  I have again recommended admission to the hospital for cardiac monitoring and further work-up patient declining this and acknowledges that he will be signing out AMA as his presenting sx/ and risk factors are such that I cannot fully rule out process such as cardiac dysrhythmia as a cause for his symptoms.  Patient agrees to follow-up with PCP.   [PR]    Clinical Course User Index [PR] Willy Eddy, MD    The patient was evaluated in Emergency Department today for the symptoms described in the history of present illness. He/she was evaluated in the context of the global COVID-19 pandemic, which necessitated consideration that the patient might be at risk for infection with the SARS-CoV-2 virus that causes COVID-19. Institutional protocols and algorithms that pertain to the evaluation of patients at risk for COVID-19 are in a state of rapid change based on information released by regulatory bodies including the CDC and federal and state organizations. These policies and algorithms were followed  during the patient's care in the ED.  As part of my medical decision making, I reviewed the following data within the electronic MEDICAL RECORD NUMBER Nursing notes reviewed and incorporated, Labs reviewed, notes from prior ED visits and [place] Controlled Substance Database   ____________________________________________   FINAL CLINICAL IMPRESSION(S) / ED DIAGNOSES  Final diagnoses:  Syncope, unspecified syncope type      NEW MEDICATIONS STARTED DURING THIS VISIT:  New Prescriptions   No medications on file     Note:  This document was prepared using Dragon voice recognition software and may include unintentional dictation errors.    Willy Eddy, MD 08/30/20 2108

## 2020-08-30 NOTE — ED Notes (Signed)
Pt resting in NAD. Reports feeling much better.
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
## 2020-09-03 ENCOUNTER — Encounter: Payer: Self-pay | Admitting: Family Medicine

## 2020-09-03 DIAGNOSIS — I10 Essential (primary) hypertension: Secondary | ICD-10-CM

## 2020-09-04 MED ORDER — LISINOPRIL 20 MG PO TABS: 20.0000 mg | ORAL_TABLET | Freq: Every day | ORAL | 3 refills | Status: DC

## 2020-11-09 ENCOUNTER — Other Ambulatory Visit: Payer: Self-pay | Admitting: Family Medicine

## 2020-12-10 LAB — HM DIABETES EYE EXAM

## 2021-01-31 NOTE — Progress Notes (Addendum)
[facility] [address] [place], [place] [address] [phone]  Date:  02/04/2021   Name:  Chad Cox   DOB:  08-29-1951   MRN:  [id number]  PCP:  Pearline Cables, MD    Chief Complaint: Medication Refill   History of Present Illness:  Chad Cox is a 70 y.o. very pleasant male patient who presents with the following:  Here today for a follow-up visit Last seen by myself in October- history of diabetes, hypertension, dyslipidemia, diverticular bleed  Eye exam- he is due. Reminded today  covid booster- not done yet, encouraged him to do this Foot exam Can give another dose of pneumovax if desired-he declines today Labs done in October  He was in the ER in October with a syncopal episode- he was evaluated and released to home We think this may have been related to using flomax as he had recently started that medication He stopped taking it and no more issues with syncope He might like to try something else for his BPH He is having nocturia 2-3x at night - he is able to get right back to sleep easily which is good   Lab Results  Component Value Date   HGBA1C 9.8 (H) 08/27/2020   farxiga Lisinopril Metformin protonix Pravastatin flomax  We will check on his diabetes today  Chad Cox 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  He is concerned because the owner of the rescue seems to be having some significant substance abuse problems.  I offered support   Patient Active Problem List   Diagnosis Date Noted  . Iron deficiency anemia due to chronic blood loss 02/06/2020  . Diverticulitis of colon with bleeding 02/06/2020  . Kidney pain 01/11/2019  . Hematuria 01/11/2019  . Bile leak, postoperative 09/26/2018  . Gallstones and inflammation of gallbladder without obstruction 09/20/2018  . Cholelithiasis and cholecystitis without obstruction 09/20/2018  . Obesity, unspecified 04/29/2014   . Diabetes mellitus 01/30/2012  . Hypertension 01/30/2012  . Dyslipidemia 01/30/2012    Past Medical History:  Diagnosis Date  . Bile leak, postoperative 09/26/2018  . Cholecystitis   . Diabetes mellitus without complication (HCC)   . Diverticulitis of colon with bleeding 02/06/2020  . Gallstones and inflammation of gallbladder without obstruction 09/20/2018  . Hypertension   . Iron deficiency anemia due to chronic blood loss 02/06/2020  . Kidney stone     Past Surgical History:  Procedure Laterality Date  . CHOLECYSTECTOMY N/A 09/20/2018   Procedure: LAPAROSCOPIC CHOLECYSTECTOMY WITH PRIMARY UMBILICAL HERNIA REPAIR;  Surgeon: Claud Kelp, MD;  Location: [facility];  Service: General;  Laterality: N/A;  . ERCP N/A 09/21/2018   Procedure: ENDOSCOPIC RETROGRADE CHOLANGIOPANCREATOGRAPHY (ERCP);  Surgeon: Jeani Hawking, MD;  Location: [facility];  Service: Endoscopy;  Laterality: N/A;  . LAPAROSCOPY N/A 09/21/2018   Procedure: LAPAROSCOPY DIAGNOSTIC, WASHOUT, DRAIN PLACEMENT;  Surgeon: Romie Levee, MD;  Location: [facility];  Service: General;  Laterality: N/A;  . WISDOM TOOTH EXTRACTION      Social History   Tobacco Use  . Smoking status: Never Smoker  . Smokeless tobacco: Never Used  Substance Use Topics  . Alcohol use: Yes    Comment: occ  . Drug use: No    Family History  Problem Relation Age of Onset  . Atrial fibrillation Mother     Allergies  Allergen Reactions  . Adhesive [Tape] Other (See Comments)    "acts like  poison ivy" blisters up, itching  . Betadine [Povidone Iodine] Other (See Comments)    Blisters, itching    Medication list has been reviewed and updated.  Current Outpatient Medications on File Prior to Visit  Medication Sig Dispense Refill  . acetaminophen (TYLENOL) 650 MG CR tablet Take 650 mg by mouth at bedtime.    . dapagliflozin propanediol (FARXIGA) 10 MG TABS tablet Take by mouth daily.    . diphenhydrAMINE (BENADRYL) 25 mg capsule Take  25 mg by mouth at bedtime.    Marland Kitchen lisinopril (ZESTRIL) 20 MG tablet Take 1 tablet (20 mg total) by mouth daily. 90 tablet 3  . Multiple Vitamin (MULTIVITAMIN) tablet Take 1 tablet by mouth daily.    . tamsulosin (FLOMAX) 0.4 MG CAPS capsule Take 1 capsule (0.4 mg total) by mouth daily. (Patient not taking: Reported on 02/04/2021) 30 capsule 3   No current facility-administered medications on file prior to visit.    Review of Systems: As per HPI- otherwise negative.   Physical Examination: Vitals:   02/04/21 0948  BP: 132/80  Pulse: 81  Resp: 17  Temp: 98.1 F (36.7 C)  SpO2: 96%   Vitals:   02/04/21 0948  Weight: 222 lb (100.7 kg)  Height: 6' (1.829 m)   Body mass index is 30.11 kg/m. Ideal Body Weight: Weight in (lb) to have BMI = 25: 183.9  GEN: no acute distress.  Obese, otherwise looks well HEENT: Atraumatic, Normocephalic.   Bilateral TM wnl, oropharynx normal.  PEERL,EOMI.    Ears and Nose: No external deformity. CV: RRR, No M/G/R. No JVD. No thrill. No extra heart sounds. PULM: CTA B, no wheezes, crackles, rhonchi. No retractions. No resp. distress. No accessory muscle use. ABD: S, NT, ND, +BS. No rebound. No HSM. EXTR: No c/c/e PSYCH: Normally interactive. Conversant.  Foot exam: Normal  Assessment and Plan: Essential hypertension - Plan: CBC, Comprehensive metabolic panel  Controlled type 2 diabetes mellitus without complication, without long-term current use of insulin (HCC) - Plan: Hemoglobin A1c, dapagliflozin propanediol (FARXIGA) 10 MG TABS tablet, metFORMIN (GLUCOPHAGE) 1000 MG tablet  Mixed hyperlipidemia - Plan: Lipid panel, pravastatin (PRAVACHOL) 80 MG tablet  Benign prostatic hyperplasia with urinary frequency - Plan: PSA, finasteride (PROSCAR) 5 MG tablet  Screening for prostate cancer - Plan: PSA  Gastroesophageal reflux disease without esophagitis - Plan: pantoprazole (PROTONIX) 40 MG tablet  Neck pain - Plan: DG Cervical Spine  Complete  Patient today for a follow-up visit.  Blood pressures under good control Await lab work-up as above PSA pending We will have him try Proscar 5 mg for BPH symptoms.  He has had some minor elevation of LFTs, will recheck this today At the end of visit he notes some soreness in his neck and more difficulty rotating his neck to the left.  No known injury, this has been gradually increasing.  We will obtain plain films of his neck today This visit occurred during the SARS-CoV-2 public health emergency.  Safety protocols were in place, including screening questions prior to the visit, additional usage of staff PPE, and extensive cleaning of exam room while observing appropriate contact time as indicated for disinfecting solutions.    Signed Abbe Amsterdam, MD  Received his labs and x-ray as below 3/31- message to pt Max metformin and farxiga  Results for orders placed or performed in visit on 02/04/21  PSA  Result Value Ref Range   PSA 2.41 0.10 - 4.00 ng/mL  Hemoglobin A1c  Result Value Ref  Range   Hgb A1c MFr Bld 9.3 (H) 4.6 - 6.5 %  CBC  Result Value Ref Range   WBC 6.8 4.0 - 10.5 K/uL   RBC 4.22 4.22 - 5.81 Mil/uL   Platelets 244.0 150.0 - 400.0 K/uL   Hemoglobin 13.6 13.0 - 17.0 g/dL   HCT 96.2 22.9 - 79.8 %   MCV 93.3 78.0 - 100.0 fl   MCHC 34.6 30.0 - 36.0 g/dL   RDW 92.1 19.4 - 17.4 %  Comprehensive metabolic panel  Result Value Ref Range   Sodium 141 135 - 145 mEq/L   Potassium 4.1 3.5 - 5.1 mEq/L   Chloride 106 96 - 112 mEq/L   CO2 26 19 - 32 mEq/L   Glucose, Bld 233 (H) 70 - 99 mg/dL   BUN 13 6 - 23 mg/dL   Creatinine, Ser 0.81 0.40 - 1.50 mg/dL   Total Bilirubin 0.5 0.2 - 1.2 mg/dL   Alkaline Phosphatase 63 39 - 117 U/L   AST 14 0 - 37 U/L   ALT 12 0 - 53 U/L   Total Protein 6.9 6.0 - 8.3 g/dL   Albumin 4.4 3.5 - 5.2 g/dL   GFR 44.81 >85.63 mL/min   Calcium 9.4 8.4 - 10.5 mg/dL  Lipid panel  Result Value Ref Range   Cholesterol 161 0 - 200 mg/dL    Triglycerides 149.7 0.0 - 149.0 mg/dL   HDL 02.63 >78.58 mg/dL   VLDL 85.0 0.0 - 27.7 mg/dL   LDL Cholesterol 94 0 - 99 mg/dL   Total CHOL/HDL Ratio 4    NonHDL 117.23     DG Cervical Spine Complete  Result Date: 02/05/2021 CLINICAL DATA:  Neck pain EXAM: CERVICAL SPINE - COMPLETE 4+ VIEW COMPARISON:  None. FINDINGS: Normal cervical lordosis. No acute fracture or listhesis of the cervical spine. Vertebral body height has been preserved. There is intervertebral disc space narrowing and endplate remodeling of C4-T1, most severe at C5-C7, in keeping with changes of mild to moderate degenerative disc disease. Small posteriorly oriented disc osteophytes are noted at C5-6 and C6-7. The spinal canal is widely patent. Oblique views demonstrate severe right neuroforaminal narrowing at C3-4 and to a lesser extent at C4-5 secondary to uncovertebral arthrosis. Mild neuroforaminal narrowing is noted at C5-6 and C6-7 secondary to uncovertebral arthrosis. On the left, there is severe neuroforaminal narrowing at C5-6 secondary to uncovertebral arthrosis. Mild to moderate neuroforaminal narrowing is noted at C3-4, C4-5, and C6-7 secondary to uncovertebral arthrosis. The prevertebral soft tissues are not thickened. IMPRESSION: Degenerative disc and degenerative joint disease resulting in multilevel neuroforaminal narrowing as described above. Electronically Signed   By: Helyn Numbers MD   On: 02/05/2021 01:18

## 2021-01-31 NOTE — Patient Instructions (Addendum)
Good to see you again today-  I will be in touch with your labs asap Assuming all is well please see me in about 6 months   Let's try Porscar/ finasteride for BPH symptoms. Let me know how this works for you- it should not drop your BP   Don't forget to schedule an eye exam!  I would also encourage a covid 19 booster asap

## 2021-02-04 ENCOUNTER — Other Ambulatory Visit: Payer: Self-pay

## 2021-02-04 ENCOUNTER — Ambulatory Visit (HOSPITAL_BASED_OUTPATIENT_CLINIC_OR_DEPARTMENT_OTHER)
Admission: RE | Admit: 2021-02-04 | Discharge: 2021-02-04 | Disposition: A | Discharge status: Home / Self Care | Payer: BC Managed Care – PPO | Source: Ambulatory Visit | Attending: Family Medicine | Admitting: Family Medicine

## 2021-02-04 ENCOUNTER — Ambulatory Visit: Payer: BC Managed Care – PPO | Admitting: Family Medicine

## 2021-02-04 ENCOUNTER — Encounter: Payer: Self-pay | Admitting: Family Medicine

## 2021-02-04 VITALS — BP 132/80 | HR 81 | Temp 98.1°F | Resp 17 | Ht 72.0 in | Wt 222.0 lb

## 2021-02-04 DIAGNOSIS — M542 Cervicalgia: Secondary | ICD-10-CM

## 2021-02-04 DIAGNOSIS — E782 Mixed hyperlipidemia: Secondary | ICD-10-CM

## 2021-02-04 DIAGNOSIS — Z125 Encounter for screening for malignant neoplasm of prostate: Secondary | ICD-10-CM

## 2021-02-04 DIAGNOSIS — N401 Enlarged prostate with lower urinary tract symptoms: Secondary | ICD-10-CM

## 2021-02-04 DIAGNOSIS — R350 Frequency of micturition: Secondary | ICD-10-CM

## 2021-02-04 DIAGNOSIS — K219 Gastro-esophageal reflux disease without esophagitis: Secondary | ICD-10-CM

## 2021-02-04 DIAGNOSIS — I10 Essential (primary) hypertension: Secondary | ICD-10-CM

## 2021-02-04 DIAGNOSIS — E119 Type 2 diabetes mellitus without complications: Secondary | ICD-10-CM | POA: Diagnosis not present

## 2021-02-04 LAB — COMPREHENSIVE METABOLIC PANEL
ALT: 12 U/L (ref 0–53)
AST: 14 U/L (ref 0–37)
Albumin: 4.4 g/dL (ref 3.5–5.2)
Alkaline Phosphatase: 63 U/L (ref 39–117)
BUN: 13 mg/dL (ref 6–23)
CO2: 26 mEq/L (ref 19–32)
Calcium: 9.4 mg/dL (ref 8.4–10.5)
Chloride: 106 mEq/L (ref 96–112)
Creatinine, Ser: 1 mg/dL (ref 0.40–1.50)
GFR: 76.97 mL/min (ref >60.00)
Glucose, Bld: 233 mg/dL — ABNORMAL HIGH (ref 70–99)
Potassium: 4.1 mEq/L (ref 3.5–5.1)
Sodium: 141 mEq/L (ref 135–145)
Total Bilirubin: 0.5 mg/dL (ref 0.2–1.2)
Total Protein: 6.9 g/dL (ref 6.0–8.3)

## 2021-02-04 LAB — LIPID PANEL
Cholesterol: 161 mg/dL (ref 0–200)
HDL: 43.9 mg/dL (ref >39.00)
LDL Cholesterol: 94 mg/dL (ref 0–99)
NonHDL: 117.23
Total CHOL/HDL Ratio: 4
Triglycerides: 118 mg/dL (ref 0.0–149.0)
VLDL: 23.6 mg/dL (ref 0.0–40.0)

## 2021-02-04 LAB — CBC
HCT: 39.4 % (ref 39.0–52.0)
Hemoglobin: 13.6 g/dL (ref 13.0–17.0)
MCHC: 34.6 g/dL (ref 30.0–36.0)
MCV: 93.3 fl (ref 78.0–100.0)
Platelets: 244 10*3/uL (ref 150.0–400.0)
RBC: 4.22 Mil/uL (ref 4.22–5.81)
RDW: 14 % (ref 11.5–15.5)
WBC: 6.8 10*3/uL (ref 4.0–10.5)

## 2021-02-04 LAB — PSA: PSA: 2.41 ng/mL (ref 0.10–4.00)

## 2021-02-04 LAB — HEMOGLOBIN A1C: Hgb A1c MFr Bld: 9.3 % — ABNORMAL HIGH (ref 4.6–6.5)

## 2021-02-04 MED ORDER — PRAVASTATIN SODIUM 80 MG PO TABS: 80.0000 mg | ORAL_TABLET | Freq: Every day | ORAL | 3 refills | Status: DC

## 2021-02-04 MED ORDER — DAPAGLIFLOZIN PROPANEDIOL 10 MG PO TABS: 10.0000 mg | ORAL_TABLET | Freq: Every day | ORAL | 3 refills | Status: DC

## 2021-02-04 MED ORDER — FINASTERIDE 5 MG PO TABS: 5.0000 mg | ORAL_TABLET | Freq: Every day | ORAL | 3 refills | Status: DC

## 2021-02-04 MED ORDER — PANTOPRAZOLE SODIUM 40 MG PO TBEC
40.0000 mg | DELAYED_RELEASE_TABLET | Freq: Every day | ORAL | 3 refills | Status: DC
Start: 2021-02-04 — End: 2022-02-10

## 2021-02-04 MED ORDER — METFORMIN HCL 1000 MG PO TABS: 1000.0000 mg | ORAL_TABLET | Freq: Two times a day (BID) | ORAL | 3 refills | Status: DC

## 2021-02-05 ENCOUNTER — Encounter: Payer: Self-pay | Admitting: Family Medicine

## 2021-02-05 NOTE — Addendum Note (Signed)
Addended by: Abbe Amsterdam C on: 02/05/2021 05:48 AM   Modules accepted: Orders

## 2021-02-21 ENCOUNTER — Other Ambulatory Visit: Payer: Self-pay | Admitting: Family Medicine

## 2021-02-21 DIAGNOSIS — E119 Type 2 diabetes mellitus without complications: Secondary | ICD-10-CM

## 2021-05-12 ENCOUNTER — Encounter: Payer: Self-pay | Admitting: Family Medicine

## 2021-06-01 ENCOUNTER — Encounter: Payer: Self-pay | Admitting: Family Medicine

## 2021-06-01 DIAGNOSIS — E119 Type 2 diabetes mellitus without complications: Secondary | ICD-10-CM

## 2021-06-04 MED ORDER — CANAGLIFLOZIN 300 MG PO TABS: 300.0000 mg | ORAL_TABLET | Freq: Every day | ORAL | 3 refills | Status: DC

## 2021-06-11 ENCOUNTER — Other Ambulatory Visit: Payer: Self-pay | Admitting: Family Medicine

## 2021-06-11 DIAGNOSIS — I10 Essential (primary) hypertension: Secondary | ICD-10-CM

## 2021-06-11 MED ORDER — GLIPIZIDE ER 5 MG PO TB24: 5.0000 mg | ORAL_TABLET | Freq: Every day | ORAL | 3 refills | Status: DC

## 2021-06-11 NOTE — Addendum Note (Signed)
Addended by: Abbe Amsterdam C on: 06/11/2021 11:25 AM   Modules accepted: Orders

## 2021-08-10 ENCOUNTER — Encounter: Payer: Self-pay | Admitting: Family Medicine

## 2021-08-18 DIAGNOSIS — Z23 Encounter for immunization: Secondary | ICD-10-CM | POA: Diagnosis not present

## 2021-11-21 ENCOUNTER — Other Ambulatory Visit: Payer: Self-pay | Admitting: Family Medicine

## 2021-11-21 DIAGNOSIS — E782 Mixed hyperlipidemia: Secondary | ICD-10-CM

## 2022-02-04 NOTE — Progress Notes (Signed)
[facility] ?[address], [address] ?[place], [place] [address] ?336 [phone] ?Fax [phone] ? ?Date:  02/10/2022  ? ?Name:  Chad Rainwaterimothy Hodgdon   DOB:  15-May-1951   MRN:  [id number] ? ?PCP:  Pearline Cables[place]land, Belmont Valli C, MD  ? ? ?Chief Complaint: Follow-up (Last OV 1 year ago/DM eye exam: none recent- [facility]/Concerns/ questions: none) ? ? ?History of Present Illness: ? ?Chad Rainwaterimothy Monical is a 71 y.o. very pleasant male patient who presents with the following: ? ?Pt seen today for follow-up ?Last seen by myself about a year ago - history of diabetes, hypertension, dyslipidemia, diverticular bleed ? ?Shingrix-recommend ?Covid booster-recommend ?Pneumonia vaccine - will give today  ?Tetanus booster-recommended pharmacy ?Eye exam- this is due, he will go  ?Foot exam is due  ?Colon due this year - reminded pt that this is due.  He is uncertain if he wishes to continue screening.  I did encourage him to at least consider getting screened 1 more time ? ?He came in earlier this week and had lab work done ?A1c was 9.3% last year-currently improved 8% ?PSA is stable ? ?Taking glipizide 5, metformin at thousand twice daily, lisinopril, Pravachol, Flomax ? ?He enjoys volunteering with a [employer] organization  ?He admits he does not get a whole lot of exercise  ? ?Results for orders placed or performed in visit on 02/08/22  ?CBC  ?Result Value Ref Range  ? WBC 7.1 4.0 - 10.5 K/uL  ? RBC 4.13 (L) 4.22 - 5.81 Mil/uL  ? Platelets 247.0 150.0 - 400.0 K/uL  ? Hemoglobin 13.8 13.0 - 17.0 g/dL  ? HCT 39.4 39.0 - 52.0 %  ? MCV 95.3 78.0 - 100.0 fl  ? MCHC 35.1 30.0 - 36.0 g/dL  ? RDW 13.2 11.5 - 15.5 %  ?Comprehensive metabolic panel  ?Result Value Ref Range  ? Sodium 141 135 - 145 mEq/L  ? Potassium 4.1 3.5 - 5.1 mEq/L  ? Chloride 105 96 - 112 mEq/L  ? CO2 25 19 - 32 mEq/L  ? Glucose, Bld 194 (H) 70 - 99 mg/dL  ? BUN 15 6 - 23 mg/dL  ? Creatinine, Ser 0.95 0.40 - 1.50 mg/dL  ? Total Bilirubin 0.7 0.2 -  1.2 mg/dL  ? Alkaline Phosphatase 54 39 - 117 U/L  ? AST 20 0 - 37 U/L  ? ALT 20 0 - 53 U/L  ? Total Protein 6.8 6.0 - 8.3 g/dL  ? Albumin 4.6 3.5 - 5.2 g/dL  ? GFR 81.28 >60.00 mL/min  ? Calcium 9.5 8.4 - 10.5 mg/dL  ?HgB A1c  ?Result Value Ref Range  ? Hgb A1c MFr Bld 8.0 (H) 4.6 - 6.5 %  ?Lipid panel  ?Result Value Ref Range  ? Cholesterol 156 0 - 200 mg/dL  ? Triglycerides 150.0 (H) 0.0 - 149.0 mg/dL  ? HDL 39.00 (L) >39.00 mg/dL  ? VLDL 30.0 0.0 - 40.0 mg/dL  ? LDL Cholesterol 87 0 - 99 mg/dL  ? Total CHOL/HDL Ratio 4   ? NonHDL 117.31   ?PSA  ?Result Value Ref Range  ? PSA 2.45 0.10 - 4.00 ng/mL  ? ? ?Patient Active Problem List  ? Diagnosis Date Noted  ? Iron deficiency anemia due to chronic blood loss 02/06/2020  ? Diverticulitis of colon with bleeding 02/06/2020  ? Kidney pain 01/11/2019  ? Hematuria 01/11/2019  ? Bile leak, postoperative 09/26/2018  ? Gallstones and inflammation of gallbladder without  obstruction 09/20/2018  ? Cholelithiasis and cholecystitis without obstruction 09/20/2018  ? Obesity, unspecified 04/29/2014  ? Diabetes mellitus 01/30/2012  ? Hypertension 01/30/2012  ? Dyslipidemia 01/30/2012  ? ? ?Past Medical History:  ?Diagnosis Date  ? Bile leak, postoperative 09/26/2018  ? Cholecystitis   ? Diabetes mellitus without complication (HCC)   ? Diverticulitis of colon with bleeding 02/06/2020  ? Gallstones and inflammation of gallbladder without obstruction 09/20/2018  ? Hypertension   ? Iron deficiency anemia due to chronic blood loss 02/06/2020  ? Kidney stone   ? ? ?Past Surgical History:  ?Procedure Laterality Date  ? CHOLECYSTECTOMY N/A 09/20/2018  ? Procedure: LAPAROSCOPIC CHOLECYSTECTOMY WITH PRIMARY UMBILICAL HERNIA REPAIR;  Surgeon: Claud Kelp, MD;  Location: [facility];  Service: General;  Laterality: N/A;  ? ERCP N/A 09/21/2018  ? Procedure: ENDOSCOPIC RETROGRADE CHOLANGIOPANCREATOGRAPHY (ERCP);  Surgeon: Jeani Hawking, MD;  Location: [facility];  Service: Endoscopy;  Laterality:  N/A;  ? LAPAROSCOPY N/A 09/21/2018  ? Procedure: LAPAROSCOPY DIAGNOSTIC, WASHOUT, DRAIN PLACEMENT;  Surgeon: Romie Levee, MD;  Location: [facility];  Service: General;  Laterality: N/A;  ? WISDOM TOOTH EXTRACTION    ? ? ?Social History  ? ?Tobacco Use  ? Smoking status: Never  ? Smokeless tobacco: Never  ?Substance Use Topics  ? Alcohol use: Yes  ?  Comment: occ  ? Drug use: No  ? ? ?Family History  ?Problem Relation Age of Onset  ? Atrial fibrillation Mother   ? ? ?Allergies  ?Allergen Reactions  ? Adhesive [Tape] Other (See Comments)  ?  "acts like poison ivy" blisters up, itching  ? Betadine [Povidone Iodine] Other (See Comments)  ?  Blisters, itching  ? ? ?Medication list has been reviewed and updated. ? ?Current Outpatient Medications on File Prior to Visit  ?Medication Sig Dispense Refill  ? acetaminophen (TYLENOL) 650 MG CR tablet Take 650 mg by mouth at bedtime.    ? diphenhydrAMINE (BENADRYL) 25 mg capsule Take 25 mg by mouth at bedtime.    ? finasteride (PROSCAR) 5 MG tablet Take 1 tablet (5 mg total) by mouth daily. 30 tablet 3  ? glipiZIDE (GLUCOTROL XL) 5 MG 24 hr tablet Take 1 tablet (5 mg total) by mouth daily with breakfast. 90 tablet 3  ? lisinopril (ZESTRIL) 20 MG tablet TAKE 1 TABLET(20 MG) BY MOUTH DAILY 90 tablet 3  ? metFORMIN (GLUCOPHAGE) 1000 MG tablet Take 1 tablet (1,000 mg total) by mouth 2 (two) times daily with a meal. 180 tablet 3  ? Multiple Vitamin (MULTIVITAMIN) tablet Take 1 tablet by mouth daily.    ? pantoprazole (PROTONIX) 40 MG tablet Take 1 tablet (40 mg total) by mouth daily. 90 tablet 3  ? pravastatin (PRAVACHOL) 80 MG tablet TAKE 1 TABLET(80 MG) BY MOUTH DAILY 30 tablet 0  ? ?No current facility-administered medications on file prior to visit.  ? ? ?Review of Systems: ? ?As per HPI- otherwise negative. ? ? ?Physical Examination: ?Vitals:  ? 02/10/22 1314  ?BP: (!) 142/70  ?Pulse: 76  ?Resp: 18  ?Temp: 97.7 ?F (36.5 ?C)  ?SpO2: 94%  ? ?Vitals:  ? 02/10/22 1314  ?Weight: 241 lb  12.8 oz (109.7 kg)  ?Height: 6' (1.829 m)  ? ?Body mass index is 32.79 kg/m?. ?Ideal Body Weight: Weight in (lb) to have BMI = 25: 183.9 ? ?GEN: no acute distress.  Mild obesity, looks well ?HEENT: Atraumatic, Normocephalic.  Bilateral TM wnl, oropharynx normal.  PEERL,EOMI.   ?Ears and Nose: No external  deformity. ?CV: RRR, No M/G/R. No JVD. No thrill. No extra heart sounds. ?PULM: CTA B, no wheezes, crackles, rhonchi. No retractions. No resp. distress. No accessory muscle use. ?ABD: S, NT, ND ?EXTR: No c/c/e ?PSYCH: Normally interactive. Conversant.  ?Foot exam- today  ? ?Assessment and Plan: ?Controlled type 2 diabetes mellitus without complication, without long-term current use of insulin (HCC) - Plan: glipiZIDE (GLUCOTROL XL) 10 MG 24 hr tablet, metFORMIN (GLUCOPHAGE) 1000 MG tablet ? ?Essential hypertension - Plan: lisinopril (ZESTRIL) 20 MG tablet ? ?Mixed hyperlipidemia ? ?Screening for prostate cancer ? ?Benign prostatic hyperplasia with urinary frequency ? ?Immunization due - Plan: Pneumococcal polysaccharide vaccine 23-valent greater than or equal to 2yo subcutaneous/IM ? ?Gastroesophageal reflux disease without esophagitis - Plan: pantoprazole (PROTONIX) 40 MG tablet ? ?Patient seen today for follow-up.  Recent A1c shows improvement though still slightly above goal ?Lab Results  ?Component Value Date  ? HGBA1C 8.0 (H) 02/08/2022  ? ?We will increase dose of glipizide XL from 5 to 10 mg, continue metformin ?Advised patient to watch out for signs of hypoglycemia ?Blood pressure well controlled on current medication ?Given pneumonia booster today ?PSA stable ?We discussed doing a coronary calcium score today, advised patient this may provide useful information to help guide [place] in his cholesterol management.  He would like to think about this.  I did ask him to add an omega-3 supplement in hopes of increasing his HDL ? ?Plan for 85-month follow-up ? ?Signed ?Abbe Amsterdam, MD ? ?

## 2022-02-04 NOTE — Patient Instructions (Addendum)
Good to see you again today!  I will be in touch with your labs asap ? ?I would like to suggest a coronary calcium score for you- this is a limited CT scan of your heart arteries; this can help [place] determine how aggressive we need to be with your cholesterol treatment.  Let me know if you might be interested in doing this ?I would also suggest adding an omega 3 supplement (OTC) to your regimen to help increase your HDL (good cholesterol)  ? ?A1c is better!  Let's increase glipizide to 10 mg (currently 5 mg) and see if we can get your A1c closer to 7- 7.5%  ? ?I do think you are due for a tetanus booster- please get this done at your pharmacy, and consider getting the shingles vaccine series and a covid booster if not done already 4 ? ?Pneumonia booster today ?Please schedule your eye exam,and I would encourage you to get your colonoscopy this year!   ?

## 2022-02-05 ENCOUNTER — Telehealth: Payer: Self-pay | Admitting: *Deleted

## 2022-02-05 DIAGNOSIS — I10 Essential (primary) hypertension: Secondary | ICD-10-CM

## 2022-02-05 DIAGNOSIS — E782 Mixed hyperlipidemia: Secondary | ICD-10-CM

## 2022-02-05 DIAGNOSIS — E119 Type 2 diabetes mellitus without complications: Secondary | ICD-10-CM

## 2022-02-05 DIAGNOSIS — N401 Enlarged prostate with lower urinary tract symptoms: Secondary | ICD-10-CM

## 2022-02-05 DIAGNOSIS — Z125 Encounter for screening for malignant neoplasm of prostate: Secondary | ICD-10-CM

## 2022-02-05 NOTE — Telephone Encounter (Signed)
Pt has lab appt on 02/08/22. I see orders pended in 02/10/22 OV w/PCP. I have cancelled orders on 4/5 and made them future status for upcoming lab appt. Please let me know if we need to do anything different regarding the lab visit and orders. ?

## 2022-02-08 ENCOUNTER — Other Ambulatory Visit (INDEPENDENT_AMBULATORY_CARE_PROVIDER_SITE_OTHER): Payer: BC Managed Care – PPO

## 2022-02-08 DIAGNOSIS — R350 Frequency of micturition: Secondary | ICD-10-CM

## 2022-02-08 DIAGNOSIS — N401 Enlarged prostate with lower urinary tract symptoms: Secondary | ICD-10-CM | POA: Diagnosis not present

## 2022-02-08 DIAGNOSIS — E119 Type 2 diabetes mellitus without complications: Secondary | ICD-10-CM

## 2022-02-08 DIAGNOSIS — E782 Mixed hyperlipidemia: Secondary | ICD-10-CM

## 2022-02-08 DIAGNOSIS — I10 Essential (primary) hypertension: Secondary | ICD-10-CM | POA: Diagnosis not present

## 2022-02-08 DIAGNOSIS — Z125 Encounter for screening for malignant neoplasm of prostate: Secondary | ICD-10-CM | POA: Diagnosis not present

## 2022-02-08 LAB — COMPREHENSIVE METABOLIC PANEL
ALT: 20 U/L (ref 0–53)
AST: 20 U/L (ref 0–37)
Albumin: 4.6 g/dL (ref 3.5–5.2)
Alkaline Phosphatase: 54 U/L (ref 39–117)
BUN: 15 mg/dL (ref 6–23)
CO2: 25 mEq/L (ref 19–32)
Calcium: 9.5 mg/dL (ref 8.4–10.5)
Chloride: 105 mEq/L (ref 96–112)
Creatinine, Ser: 0.95 mg/dL (ref 0.40–1.50)
GFR: 81.28 mL/min (ref >60.00)
Glucose, Bld: 194 mg/dL — ABNORMAL HIGH (ref 70–99)
Potassium: 4.1 mEq/L (ref 3.5–5.1)
Sodium: 141 mEq/L (ref 135–145)
Total Bilirubin: 0.7 mg/dL (ref 0.2–1.2)
Total Protein: 6.8 g/dL (ref 6.0–8.3)

## 2022-02-08 LAB — LIPID PANEL
Cholesterol: 156 mg/dL (ref 0–200)
HDL: 39 mg/dL — ABNORMAL LOW (ref >39.00)
LDL Cholesterol: 87 mg/dL (ref 0–99)
NonHDL: 117.31
Total CHOL/HDL Ratio: 4
Triglycerides: 150 mg/dL — ABNORMAL HIGH (ref 0.0–149.0)
VLDL: 30 mg/dL (ref 0.0–40.0)

## 2022-02-08 LAB — CBC
HCT: 39.4 % (ref 39.0–52.0)
Hemoglobin: 13.8 g/dL (ref 13.0–17.0)
MCHC: 35.1 g/dL (ref 30.0–36.0)
MCV: 95.3 fl (ref 78.0–100.0)
Platelets: 247 10*3/uL (ref 150.0–400.0)
RBC: 4.13 Mil/uL — ABNORMAL LOW (ref 4.22–5.81)
RDW: 13.2 % (ref 11.5–15.5)
WBC: 7.1 10*3/uL (ref 4.0–10.5)

## 2022-02-08 LAB — HEMOGLOBIN A1C: Hgb A1c MFr Bld: 8 % — ABNORMAL HIGH (ref 4.6–6.5)

## 2022-02-08 LAB — PSA: PSA: 2.45 ng/mL (ref 0.10–4.00)

## 2022-02-09 ENCOUNTER — Encounter: Payer: Self-pay | Admitting: Family Medicine

## 2022-02-10 ENCOUNTER — Ambulatory Visit: Payer: BC Managed Care – PPO | Admitting: Family Medicine

## 2022-02-10 VITALS — BP 142/70 | HR 76 | Temp 97.7°F | Resp 18 | Ht 72.0 in | Wt 241.8 lb

## 2022-02-10 DIAGNOSIS — E119 Type 2 diabetes mellitus without complications: Secondary | ICD-10-CM | POA: Diagnosis not present

## 2022-02-10 DIAGNOSIS — Z23 Encounter for immunization: Secondary | ICD-10-CM | POA: Diagnosis not present

## 2022-02-10 DIAGNOSIS — R350 Frequency of micturition: Secondary | ICD-10-CM

## 2022-02-10 DIAGNOSIS — I10 Essential (primary) hypertension: Secondary | ICD-10-CM | POA: Diagnosis not present

## 2022-02-10 DIAGNOSIS — Z125 Encounter for screening for malignant neoplasm of prostate: Secondary | ICD-10-CM

## 2022-02-10 DIAGNOSIS — E782 Mixed hyperlipidemia: Secondary | ICD-10-CM

## 2022-02-10 DIAGNOSIS — K219 Gastro-esophageal reflux disease without esophagitis: Secondary | ICD-10-CM

## 2022-02-10 DIAGNOSIS — N401 Enlarged prostate with lower urinary tract symptoms: Secondary | ICD-10-CM

## 2022-02-10 MED ORDER — METFORMIN HCL 1000 MG PO TABS: 1000.0000 mg | ORAL_TABLET | Freq: Two times a day (BID) | ORAL | 3 refills | Status: DC

## 2022-02-10 MED ORDER — PANTOPRAZOLE SODIUM 40 MG PO TBEC: 40.0000 mg | DELAYED_RELEASE_TABLET | Freq: Every day | ORAL | 3 refills | Status: DC

## 2022-02-10 MED ORDER — LISINOPRIL 20 MG PO TABS: ORAL_TABLET | ORAL | 3 refills | Status: DC

## 2022-02-10 MED ORDER — GLIPIZIDE ER 10 MG PO TB24: 10.0000 mg | ORAL_TABLET | Freq: Every day | ORAL | 3 refills | Status: DC

## 2022-03-02 DIAGNOSIS — E7800 Pure hypercholesterolemia, unspecified: Secondary | ICD-10-CM | POA: Diagnosis not present

## 2022-03-02 DIAGNOSIS — K921 Melena: Secondary | ICD-10-CM | POA: Diagnosis not present

## 2022-03-02 DIAGNOSIS — I959 Hypotension, unspecified: Secondary | ICD-10-CM | POA: Diagnosis not present

## 2022-03-02 DIAGNOSIS — I10 Essential (primary) hypertension: Secondary | ICD-10-CM | POA: Diagnosis not present

## 2022-03-02 DIAGNOSIS — K5731 Diverticulosis of large intestine without perforation or abscess with bleeding: Secondary | ICD-10-CM | POA: Diagnosis not present

## 2022-03-02 DIAGNOSIS — K922 Gastrointestinal hemorrhage, unspecified: Secondary | ICD-10-CM | POA: Diagnosis not present

## 2022-03-02 DIAGNOSIS — E119 Type 2 diabetes mellitus without complications: Secondary | ICD-10-CM | POA: Diagnosis not present

## 2022-03-02 DIAGNOSIS — K5730 Diverticulosis of large intestine without perforation or abscess without bleeding: Secondary | ICD-10-CM | POA: Diagnosis not present

## 2022-03-02 DIAGNOSIS — E669 Obesity, unspecified: Secondary | ICD-10-CM | POA: Diagnosis not present

## 2022-03-02 DIAGNOSIS — Z794 Long term (current) use of insulin: Secondary | ICD-10-CM | POA: Diagnosis not present

## 2022-03-02 DIAGNOSIS — E785 Hyperlipidemia, unspecified: Secondary | ICD-10-CM | POA: Diagnosis not present

## 2022-03-02 DIAGNOSIS — D62 Acute posthemorrhagic anemia: Secondary | ICD-10-CM | POA: Diagnosis not present

## 2022-03-05 DIAGNOSIS — I10 Essential (primary) hypertension: Secondary | ICD-10-CM | POA: Diagnosis not present

## 2022-03-05 DIAGNOSIS — E119 Type 2 diabetes mellitus without complications: Secondary | ICD-10-CM | POA: Diagnosis not present

## 2022-03-05 DIAGNOSIS — Z6832 Body mass index (BMI) 32.0-32.9, adult: Secondary | ICD-10-CM | POA: Diagnosis not present

## 2022-03-05 DIAGNOSIS — K219 Gastro-esophageal reflux disease without esophagitis: Secondary | ICD-10-CM | POA: Diagnosis not present

## 2022-03-05 DIAGNOSIS — Z8719 Personal history of other diseases of the digestive system: Secondary | ICD-10-CM | POA: Diagnosis not present

## 2022-03-05 DIAGNOSIS — K921 Melena: Secondary | ICD-10-CM | POA: Diagnosis not present

## 2022-03-05 DIAGNOSIS — D649 Anemia, unspecified: Secondary | ICD-10-CM | POA: Diagnosis not present

## 2022-03-05 DIAGNOSIS — K5909 Other constipation: Secondary | ICD-10-CM | POA: Diagnosis not present

## 2022-03-05 DIAGNOSIS — Z87442 Personal history of urinary calculi: Secondary | ICD-10-CM | POA: Diagnosis not present

## 2022-03-05 DIAGNOSIS — D62 Acute posthemorrhagic anemia: Secondary | ICD-10-CM | POA: Diagnosis not present

## 2022-03-05 DIAGNOSIS — K625 Hemorrhage of anus and rectum: Secondary | ICD-10-CM | POA: Diagnosis not present

## 2022-03-05 DIAGNOSIS — E7800 Pure hypercholesterolemia, unspecified: Secondary | ICD-10-CM | POA: Diagnosis not present

## 2022-03-05 DIAGNOSIS — E669 Obesity, unspecified: Secondary | ICD-10-CM | POA: Diagnosis not present

## 2022-03-05 DIAGNOSIS — K5730 Diverticulosis of large intestine without perforation or abscess without bleeding: Secondary | ICD-10-CM | POA: Diagnosis not present

## 2022-03-05 DIAGNOSIS — K5731 Diverticulosis of large intestine without perforation or abscess with bleeding: Secondary | ICD-10-CM | POA: Diagnosis not present

## 2022-03-05 DIAGNOSIS — K922 Gastrointestinal hemorrhage, unspecified: Secondary | ICD-10-CM | POA: Diagnosis not present

## 2022-03-05 LAB — HM COLONOSCOPY

## 2022-04-05 ENCOUNTER — Other Ambulatory Visit: Payer: Self-pay | Admitting: Family Medicine

## 2022-04-05 DIAGNOSIS — E782 Mixed hyperlipidemia: Secondary | ICD-10-CM

## 2022-08-18 DIAGNOSIS — Z23 Encounter for immunization: Secondary | ICD-10-CM | POA: Diagnosis not present

## 2022-09-24 ENCOUNTER — Other Ambulatory Visit: Payer: Self-pay | Admitting: Family Medicine

## 2022-09-24 DIAGNOSIS — E782 Mixed hyperlipidemia: Secondary | ICD-10-CM

## 2022-12-15 ENCOUNTER — Other Ambulatory Visit: Payer: Self-pay | Admitting: Family Medicine

## 2022-12-15 DIAGNOSIS — K219 Gastro-esophageal reflux disease without esophagitis: Secondary | ICD-10-CM

## 2023-03-08 NOTE — Patient Instructions (Incomplete)
It was good to see again today, I will be in touch with the results of soon as possible. Recommend a COVID booster if not done in the last 9 months or so, also shingles series at your convenience Recommend shingirx series at your pharmacy if not done yet   We will try substituting losartan for lisinopril and see if it helps with your cough - let me know I will be in touch with your CT coronary calcium screening- if you have significant plaque we may want to change out pravachol for a more potent statin such as crestor  Assuming all is well please plan to see me in about 6 months

## 2023-03-08 NOTE — Progress Notes (Unsigned)
[facility] [address] [place], [place] [address] [phone]  Date:  03/10/2023   Name:  Chad Cox   DOB:  1951-04-19   MRN:  [id number]  PCP:  Pearline Cables, MD    Chief Complaint: medication follow up (Concerns/ questions: dry cough continues to worsen- wonders if this could be d/t Lisinopril/Tdap: pt thinks this is UTD/Shing: none/DM urine, foot exam, A1C due today/Colon: last year- [facility])   History of Present Illness:  Chad Cox is a 72 y.o. 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 urine screen time  Overdue for labs and A1c check Eye exam- he thinks he is due, will get around to this  He had a colonoscopy last year during his GI bleed- 03/06/22 Can offer CT coronary calcium Tdap- he feels pretty sure this is UTD, he was bitten by a dog and he thinks this was done in the last 5 years or so Shingrix- recommended  Needs foot exam Recommend COVID booster  He has noted a dry cough - he wonders if this might be due to lisinopril, which he has taken for a long time  He notes the cough is a bit more frequent but not productive No hemoptysis  Patient Active Problem List   Diagnosis Date Noted   Iron deficiency anemia due to chronic blood loss 02/06/2020   Diverticulitis of colon with bleeding 02/06/2020   Kidney pain 01/11/2019   Hematuria 01/11/2019   Bile leak, postoperative 09/26/2018   Gallstones and inflammation of gallbladder without obstruction 09/20/2018   Cholelithiasis  and cholecystitis without obstruction 09/20/2018   Obesity, unspecified 04/29/2014   Diabetes mellitus 01/30/2012   Hypertension 01/30/2012   Dyslipidemia 01/30/2012    Past Medical History:  Diagnosis Date   Bile leak, postoperative 09/26/2018   Cholecystitis    Diabetes mellitus without complication (HCC)    Diverticulitis of colon with bleeding 02/06/2020   Gallstones and inflammation of gallbladder without obstruction 09/20/2018   Hypertension    Iron deficiency anemia due to chronic blood loss 02/06/2020   Kidney stone     Past Surgical History:  Procedure Laterality Date   CHOLECYSTECTOMY N/A 09/20/2018   Procedure: LAPAROSCOPIC CHOLECYSTECTOMY WITH PRIMARY UMBILICAL HERNIA REPAIR;  Surgeon: Claud Kelp, MD;  Location: [facility];  Service: General;  Laterality: N/A;   ERCP N/A 09/21/2018   Procedure: ENDOSCOPIC RETROGRADE CHOLANGIOPANCREATOGRAPHY (ERCP);  Surgeon: Jeani Hawking, MD;  Location: [facility];  Service: Endoscopy;  Laterality: N/A;   LAPAROSCOPY N/A 09/21/2018   Procedure: LAPAROSCOPY DIAGNOSTIC, WASHOUT, DRAIN PLACEMENT;  Surgeon: Romie Levee, MD;  Location: [facility];  Service: General;  Laterality: N/A;   WISDOM TOOTH EXTRACTION      Social History   Tobacco Use   Smoking status: Never   Smokeless tobacco: Never  Substance Use Topics   Alcohol use: Yes    Comment: occ   Drug use: No    Family History  Problem Relation Age of Onset   Atrial fibrillation Mother  Allergies  Allergen Reactions   Adhesive [Tape] Other (See Comments)    "acts like poison ivy" blisters up, itching   Betadine [Povidone Iodine] Other (See Comments)    Blisters, itching    Medication list has been reviewed and updated.  Current Outpatient Medications on File Prior to Visit  Medication Sig Dispense Refill   acetaminophen (TYLENOL) 650 MG CR tablet Take 650 mg by mouth at bedtime.     finasteride (PROSCAR) 5 MG tablet Take 1 tablet (5 mg total) by mouth daily.  30 tablet 3   glipiZIDE (GLUCOTROL XL) 10 MG 24 hr tablet Take 1 tablet (10 mg total) by mouth daily with breakfast. 90 tablet 3   lisinopril (ZESTRIL) 20 MG tablet TAKE 1 TABLET(20 MG) BY MOUTH DAILY 90 tablet 3   metFORMIN (GLUCOPHAGE) 1000 MG tablet Take 1 tablet (1,000 mg total) by mouth 2 (two) times daily with a meal. 180 tablet 3   Multiple Vitamin (MULTIVITAMIN) tablet Take 1 tablet by mouth daily.     pantoprazole (PROTONIX) 40 MG tablet TAKE 1 TABLET(40 MG) BY MOUTH DAILY 90 tablet 3   pravastatin (PRAVACHOL) 80 MG tablet TAKE 1 TABLET(80 MG) BY MOUTH DAILY 90 tablet 1   No current facility-administered medications on file prior to visit.    Review of Systems:  As per HPI- otherwise negative.   Physical Examination: Vitals:   03/10/23 0847  BP: 136/78  Pulse: 78  Resp: 18  Temp: 98.2 F (36.8 C)  SpO2: 96%   Vitals:   03/10/23 0847  Weight: 232 lb 6.4 oz (105.4 kg)  Height: 6' (1.829 m)   Body mass index is 31.52 kg/m. Ideal Body Weight: Weight in (lb) to have BMI = 25: 183.9  GEN: no acute distress. Obese, looks well  HEENT: Atraumatic, Normocephalic.  Bilateral TM wnl, oropharynx normal.  PEERL,EOMI.   Ears and Nose: No external deformity. CV: RRR, No M/G/R. No JVD. No thrill. No extra heart sounds. PULM: CTA B, no wheezes, crackles, rhonchi. No retractions. No resp. distress. No accessory muscle use. ABD: S, NT, ND, +BS. No rebound. No HSM. EXTR: No c/c/e PSYCH: Normally interactive. Conversant.  Foot exam- normal today   Assessment and Plan: Controlled type 2 diabetes mellitus without complication, without long-term current use of insulin (HCC) - Plan: Comprehensive metabolic panel, Hemoglobin A1c, Microalbumin / creatinine urine ratio, glipiZIDE (GLUCOTROL XL) 10 MG 24 hr tablet, metFORMIN (GLUCOPHAGE) 1000 MG tablet, CT CARDIAC SCORING (SELF PAY ONLY)  Essential hypertension - Plan: CBC, losartan (COZAAR) 50 MG tablet, CT CARDIAC SCORING (SELF PAY  ONLY)  Mixed hyperlipidemia - Plan: Lipid panel, CT CARDIAC SCORING (SELF PAY ONLY)  Benign prostatic hyperplasia with urinary frequency - Plan: PSA  Screening for prostate cancer - Plan: PSA  History of GI bleed - Plan: CBC  Gastroesophageal reflux disease without esophagitis - Plan: pantoprazole (PROTONIX) 40 MG tablet Seen today for follow-up, labs are pending as above.  A bit overdue for diabetes monitoring Blood pressure under good control but he is possible cough secondary to ACE inhibitor.  Will switch from lisinopril to losartan Recommend Shingrix Will plan further follow- up pending labs. Plan for 72-month follow-up assuming all is well  Signed Abbe Amsterdam, MD  Received labs as below, message to patient  Your red cells count/hemoglobin and hematocrit are significantly low.  This makes me wonder if you may still be losing some blood from the GI system-?  We will send you an occult blood stool test  in the mail, if you would please complete this at your convenience and send back. We can then plan our next step, likely need to recheck your CBC in the next 4 weeks or so  Metabolic profile looks fine except for elevated blood sugar I am afraid your A1c/average blood sugar is significantly above goal We might want to try a GLP-1 agonist drug for you-these are some of the newer injectable medications you may have heard about, including Ozempic, Trulicity, Mounjaro.  These medications have powerful blood sugar lowering effects and also can assist with weight loss.  They are given by injection at home using a prefilled pen.  Is this something you would potentially consider?  Cholesterol is overall good-I will let you know what your CT coronary test shows [place]  PSA is still in normal range, but it has gone up a fair amount since last year I would like to recheck this when we repeat your blood counts  Lab Results  Component Value Date   PSA 3.17 03/10/2023   PSA 2.45 02/08/2022    PSA 2.41 02/04/2021  To do list!  Let me know what you think about trying a GLP-1 drug Watch for stool test in the mail, complete and return ASAP Schedule lab visit only in about 4 weeks, we can recheck your CBC and PSA at that time  Results for orders placed or performed in visit on 03/10/23  CBC  Result Value Ref Range   WBC 6.5 4.0 - 10.5 K/uL   RBC 3.86 (L) 4.22 - 5.81 Mil/uL   Platelets 338.0 150.0 - 400.0 K/uL   Hemoglobin 9.9 (L) 13.0 - 17.0 g/dL   HCT 29.5 (L) 62.1 - 30.8 %   MCV 80.2 78.0 - 100.0 fl   MCHC 31.9 30.0 - 36.0 g/dL   RDW 65.7 (H) 84.6 - 96.2 %  Comprehensive metabolic panel  Result Value Ref Range   Sodium 139 135 - 145 mEq/L   Potassium 4.3 3.5 - 5.1 mEq/L   Chloride 106 96 - 112 mEq/L   CO2 23 19 - 32 mEq/L   Glucose, Bld 225 (H) 70 - 99 mg/dL   BUN 17 6 - 23 mg/dL   Creatinine, Ser 9.52 0.40 - 1.50 mg/dL   Total Bilirubin 0.3 0.2 - 1.2 mg/dL   Alkaline Phosphatase 52 39 - 117 U/L   AST 14 0 - 37 U/L   ALT 15 0 - 53 U/L   Total Protein 6.8 6.0 - 8.3 g/dL   Albumin 4.4 3.5 - 5.2 g/dL   GFR 84.13 >24.40 mL/min   Calcium 9.3 8.4 - 10.5 mg/dL  Hemoglobin N0U  Result Value Ref Range   Hgb A1c MFr Bld 9.2 (H) 4.6 - 6.5 %  Lipid panel  Result Value Ref Range   Cholesterol 137 0 - 200 mg/dL   Triglycerides 725.3 0.0 - 149.0 mg/dL   HDL 66.44 (L) >03.47 mg/dL   VLDL 42.5 0.0 - 95.6 mg/dL   LDL Cholesterol 75 0 - 99 mg/dL   Total CHOL/HDL Ratio 4    NonHDL 98.21   PSA  Result Value Ref Range   PSA 3.17 0.10 - 4.00 ng/mL

## 2023-03-10 ENCOUNTER — Encounter: Payer: Self-pay | Admitting: Family Medicine

## 2023-03-10 ENCOUNTER — Ambulatory Visit: Payer: BC Managed Care – PPO | Admitting: Family Medicine

## 2023-03-10 VITALS — BP 136/78 | HR 78 | Temp 98.2°F | Resp 18 | Ht 72.0 in | Wt 232.4 lb

## 2023-03-10 DIAGNOSIS — N401 Enlarged prostate with lower urinary tract symptoms: Secondary | ICD-10-CM | POA: Diagnosis not present

## 2023-03-10 DIAGNOSIS — Z8719 Personal history of other diseases of the digestive system: Secondary | ICD-10-CM

## 2023-03-10 DIAGNOSIS — E119 Type 2 diabetes mellitus without complications: Secondary | ICD-10-CM

## 2023-03-10 DIAGNOSIS — Z7984 Long term (current) use of oral hypoglycemic drugs: Secondary | ICD-10-CM

## 2023-03-10 DIAGNOSIS — E782 Mixed hyperlipidemia: Secondary | ICD-10-CM | POA: Diagnosis not present

## 2023-03-10 DIAGNOSIS — I10 Essential (primary) hypertension: Secondary | ICD-10-CM | POA: Diagnosis not present

## 2023-03-10 DIAGNOSIS — R350 Frequency of micturition: Secondary | ICD-10-CM | POA: Diagnosis not present

## 2023-03-10 DIAGNOSIS — K219 Gastro-esophageal reflux disease without esophagitis: Secondary | ICD-10-CM

## 2023-03-10 DIAGNOSIS — Z125 Encounter for screening for malignant neoplasm of prostate: Secondary | ICD-10-CM

## 2023-03-10 DIAGNOSIS — D649 Anemia, unspecified: Secondary | ICD-10-CM

## 2023-03-10 DIAGNOSIS — R972 Elevated prostate specific antigen [PSA]: Secondary | ICD-10-CM

## 2023-03-10 LAB — CBC
HCT: 30.9 % — ABNORMAL LOW (ref 39.0–52.0)
Hemoglobin: 9.9 g/dL — ABNORMAL LOW (ref 13.0–17.0)
MCHC: 31.9 g/dL (ref 30.0–36.0)
MCV: 80.2 fl (ref 78.0–100.0)
Platelets: 338 10*3/uL (ref 150.0–400.0)
RBC: 3.86 Mil/uL — ABNORMAL LOW (ref 4.22–5.81)
RDW: 15.8 % — ABNORMAL HIGH (ref 11.5–15.5)
WBC: 6.5 10*3/uL (ref 4.0–10.5)

## 2023-03-10 LAB — COMPREHENSIVE METABOLIC PANEL
ALT: 15 U/L (ref 0–53)
AST: 14 U/L (ref 0–37)
Albumin: 4.4 g/dL (ref 3.5–5.2)
Alkaline Phosphatase: 52 U/L (ref 39–117)
BUN: 17 mg/dL (ref 6–23)
CO2: 23 mEq/L (ref 19–32)
Calcium: 9.3 mg/dL (ref 8.4–10.5)
Chloride: 106 mEq/L (ref 96–112)
Creatinine, Ser: 0.96 mg/dL (ref 0.40–1.50)
GFR: 79.65 mL/min (ref >60.00)
Glucose, Bld: 225 mg/dL — ABNORMAL HIGH (ref 70–99)
Potassium: 4.3 mEq/L (ref 3.5–5.1)
Sodium: 139 mEq/L (ref 135–145)
Total Bilirubin: 0.3 mg/dL (ref 0.2–1.2)
Total Protein: 6.8 g/dL (ref 6.0–8.3)

## 2023-03-10 LAB — LIPID PANEL
Cholesterol: 137 mg/dL (ref 0–200)
HDL: 38.8 mg/dL — ABNORMAL LOW (ref >39.00)
LDL Cholesterol: 75 mg/dL (ref 0–99)
NonHDL: 98.21
Total CHOL/HDL Ratio: 4
Triglycerides: 116 mg/dL (ref 0.0–149.0)
VLDL: 23.2 mg/dL (ref 0.0–40.0)

## 2023-03-10 LAB — MICROALBUMIN / CREATININE URINE RATIO
Creatinine,U: 144.5 mg/dL
Microalb Creat Ratio: 3.7 mg/g (ref 0.0–30.0)
Microalb, Ur: 5.4 mg/dL — ABNORMAL HIGH (ref 0.0–1.9)

## 2023-03-10 LAB — PSA: PSA: 3.17 ng/mL (ref 0.10–4.00)

## 2023-03-10 LAB — HEMOGLOBIN A1C: Hgb A1c MFr Bld: 9.2 % — ABNORMAL HIGH (ref 4.6–6.5)

## 2023-03-10 MED ORDER — METFORMIN HCL 1000 MG PO TABS: 1000.0000 mg | ORAL_TABLET | Freq: Two times a day (BID) | ORAL | 3 refills | Status: DC

## 2023-03-10 MED ORDER — LOSARTAN POTASSIUM 50 MG PO TABS: 50.0000 mg | ORAL_TABLET | Freq: Every day | ORAL | 3 refills | Status: DC

## 2023-03-10 MED ORDER — GLIPIZIDE ER 10 MG PO TB24: 10.0000 mg | ORAL_TABLET | Freq: Every day | ORAL | 3 refills | Status: DC

## 2023-03-10 MED ORDER — PANTOPRAZOLE SODIUM 40 MG PO TBEC: DELAYED_RELEASE_TABLET | ORAL | 3 refills | Status: DC

## 2023-03-17 ENCOUNTER — Other Ambulatory Visit: Payer: Self-pay | Admitting: Family Medicine

## 2023-03-17 DIAGNOSIS — E782 Mixed hyperlipidemia: Secondary | ICD-10-CM

## 2023-03-19 ENCOUNTER — Other Ambulatory Visit: Payer: Self-pay | Admitting: Family Medicine

## 2023-03-19 DIAGNOSIS — E119 Type 2 diabetes mellitus without complications: Secondary | ICD-10-CM

## 2023-03-21 ENCOUNTER — Other Ambulatory Visit: Payer: Self-pay

## 2023-03-21 DIAGNOSIS — E119 Type 2 diabetes mellitus without complications: Secondary | ICD-10-CM

## 2023-03-21 MED ORDER — METFORMIN HCL 1000 MG PO TABS
1000.0000 mg | ORAL_TABLET | Freq: Two times a day (BID) | ORAL | 3 refills | Status: DC
Start: 2023-03-21 — End: 2024-01-26

## 2023-03-28 ENCOUNTER — Other Ambulatory Visit: Payer: Self-pay | Admitting: Family Medicine

## 2023-03-28 DIAGNOSIS — I10 Essential (primary) hypertension: Secondary | ICD-10-CM

## 2023-04-07 ENCOUNTER — Other Ambulatory Visit: Payer: Self-pay | Admitting: Family Medicine

## 2023-04-07 DIAGNOSIS — E119 Type 2 diabetes mellitus without complications: Secondary | ICD-10-CM

## 2023-05-01 DIAGNOSIS — E119 Type 2 diabetes mellitus without complications: Secondary | ICD-10-CM | POA: Diagnosis not present

## 2023-05-01 LAB — HM DIABETES EYE EXAM

## 2023-08-16 ENCOUNTER — Ambulatory Visit: Payer: BC Managed Care – PPO | Admitting: Physician Assistant

## 2023-08-16 ENCOUNTER — Encounter: Payer: Self-pay | Admitting: Physician Assistant

## 2023-08-16 VITALS — BP 148/78 | HR 89 | Temp 98.1°F | Ht 72.0 in | Wt 228.6 lb

## 2023-08-16 DIAGNOSIS — K625 Hemorrhage of anus and rectum: Secondary | ICD-10-CM | POA: Diagnosis not present

## 2023-08-16 DIAGNOSIS — D649 Anemia, unspecified: Secondary | ICD-10-CM

## 2023-08-16 NOTE — Progress Notes (Signed)
Established patient visit   Patient: Chad Cox   DOB: 1951/05/29   72 y.o. Male  MRN: [id number] Visit Date: 08/16/2023  Today's healthcare provider: Alfredia Ferguson, PA-C   Cc. Rectal bleeding x 3 days  Subjective    HPI  H/o of BRBPR 02/2022, colonscopy demonstrated blood throughout color w/ no active bleeding, ct demonstrated diverticulosis, Hgb trended down 12.9 to 9.3. Determined to be a diverticular bleed. Pt was recommended a repeat colonscopy in 6 months, that would have been 10/23. Pt reports he did not f/u.  During a follow up 5/24 he was found to be fairly anemic- Hgb 9.9; and was recommended to complete a stool test, which he did not proceed with.  Discussed the use of AI scribe software for clinical note transcription with the patient, who gave verbal consent to proceed.  History of Present Illness   The patient, with a history of diverticular bleeds and anemia, presents with intermittent rectal bleeding that started on Sunday. The bleeding is described as a dark reddish-brown color, different from the bright red blood seen during previous diverticular bleeds. The patient denies  abdominal pain and pain during bowel movements. The patient also reports occasional diarrhea, which they have managed with Pepto Bismol. They have noticed a correlation between consuming peanut butter and the onset of diarrhea. The patient expresses concern about the possibility of cancer.        Medications: Outpatient Medications Prior to Visit  Medication Sig   acetaminophen (TYLENOL) 650 MG CR tablet Take 650 mg by mouth at bedtime.   glipiZIDE (GLUCOTROL XL) 10 MG 24 hr tablet Take 1 tablet (10 mg total) by mouth daily with breakfast.   losartan (COZAAR) 50 MG tablet Take 1 tablet (50 mg total) by mouth daily.   metFORMIN (GLUCOPHAGE) 1000 MG tablet Take 1 tablet (1,000 mg total) by mouth 2 (two) times daily with a meal.   Multiple Vitamin (MULTIVITAMIN) tablet Take 1 tablet by  mouth daily.   pantoprazole (PROTONIX) 40 MG tablet TAKE 1 TABLET(40 MG) BY MOUTH DAILY   pravastatin (PRAVACHOL) 80 MG tablet Take 1 tablet (80 mg total) by mouth daily.   finasteride (PROSCAR) 5 MG tablet Take 1 tablet (5 mg total) by mouth daily. (Patient not taking: Reported on 08/16/2023)   No facility-administered medications prior to visit.    Review of Systems  Constitutional:  Negative for fatigue and fever.  Respiratory:  Negative for cough and shortness of breath.   Cardiovascular:  Negative for chest pain, palpitations and leg swelling.  Gastrointestinal:  Positive for blood in stool.  Neurological:  Negative for dizziness and headaches.      Objective    BP (!) 148/78 (BP Location: Left Arm, Patient Position: Sitting, Cuff Size: Normal)   Pulse 89   Temp 98.1 F (36.7 C) (Oral)   Ht 6' (1.829 m)   Wt 228 lb 9.6 oz (103.7 kg)   SpO2 98%   BMI 31.00 kg/m   Physical Exam Constitutional:      General: He is awake.     Appearance: He is well-developed.  HENT:     Head: Normocephalic.  Eyes:     Conjunctiva/sclera: Conjunctivae normal.  Cardiovascular:     Rate and Rhythm: Normal rate and regular rhythm.     Heart sounds: Normal heart sounds.  Pulmonary:     Effort: Pulmonary effort is normal.     Breath sounds: Normal breath sounds.  Abdominal:  Palpations: Abdomen is soft.     Tenderness: There is no abdominal tenderness. There is no guarding.  Skin:    General: Skin is warm.  Neurological:     Mental Status: He is alert and oriented to person, place, and time.  Psychiatric:        Attention and Perception: Attention normal.        Mood and Affect: Mood normal. Affect is tearful.        Speech: Speech normal.        Behavior: Behavior is cooperative.      No results found for any visits on 08/16/23.  Assessment & Plan     1. Anemia, unspecified type Repeat cbc/iron to determine stability  Recommending iron supplementation, cautioned for  constipation  - CBC w/Diff - IBC + Ferritin - Ambulatory referral to Gastroenterology  2. Rectal bleeding Ref to GI for colonoscopy. Advised pt on ED recommendations if bleeding persists/worsens - Ambulatory referral to Gastroenterology   Return if symptoms worsen or fail to improve.      I, Alfredia Ferguson, PA-C have reviewed all documentation for this visit. The documentation on  08/16/23   for the exam, diagnosis, procedures, and orders are all accurate and complete.    Alfredia Ferguson, PA-C  [facility] [phone] (phone) [phone] (fax)  [facility]

## 2023-08-17 LAB — IBC + FERRITIN
Ferritin: 4.6 ng/mL — ABNORMAL LOW (ref 22.0–322.0)
Iron: 39 ug/dL — ABNORMAL LOW (ref 42–165)
Saturation Ratios: 7.2 % — ABNORMAL LOW (ref 20.0–50.0)
TIBC: 544.6 ug/dL — ABNORMAL HIGH (ref 250.0–450.0)
Transferrin: 389 mg/dL — ABNORMAL HIGH (ref 212.0–360.0)

## 2023-08-17 LAB — CBC WITH DIFFERENTIAL/PLATELET
Basophils Absolute: 0.1 10*3/uL (ref 0.0–0.1)
Basophils Relative: 0.7 % (ref 0.0–3.0)
Eosinophils Absolute: 0.1 10*3/uL (ref 0.0–0.7)
Eosinophils Relative: 1.6 % (ref 0.0–5.0)
HCT: 31.9 % — ABNORMAL LOW (ref 39.0–52.0)
Hemoglobin: 10.1 g/dL — ABNORMAL LOW (ref 13.0–17.0)
Lymphocytes Relative: 8.6 % — ABNORMAL LOW (ref 12.0–46.0)
Lymphs Abs: 0.8 10*3/uL (ref 0.7–4.0)
MCHC: 31.8 g/dL (ref 30.0–36.0)
MCV: 82 fL (ref 78.0–100.0)
Monocytes Absolute: 0.4 10*3/uL (ref 0.1–1.0)
Monocytes Relative: 4.4 % (ref 3.0–12.0)
Neutro Abs: 7.6 10*3/uL (ref 1.4–7.7)
Neutrophils Relative %: 84.7 % — ABNORMAL HIGH (ref 43.0–77.0)
Platelets: 342 10*3/uL (ref 150.0–400.0)
RBC: 3.89 Mil/uL — ABNORMAL LOW (ref 4.22–5.81)
RDW: 15.8 % — ABNORMAL HIGH (ref 11.5–15.5)
WBC: 8.9 10*3/uL (ref 4.0–10.5)

## 2023-08-19 ENCOUNTER — Telehealth: Payer: Self-pay | Admitting: Internal Medicine

## 2023-08-19 NOTE — Telephone Encounter (Signed)
Good afternoon Dr. Leonides Schanz,  Supervising Provider: 08/19/23-PM   We received a referral for patient to be evaluated for Anemia and Rectal bleeding. The patient does have GI history with [facility], last colonoscopy was performed last year. He is seeking a transfer because all his doctors now are within the [facility] in [place]. Records are available in Epic for you to review and advise on scheduling.   Thanks

## 2023-08-19 NOTE — Telephone Encounter (Signed)
Okay to schedule for OV.  CTA A/P w/contrast 03/02/22: Colonic diverticulosis without focal diverticulitis. No evidence for acute arterial extravasation is seen.  Colonoscopy 03/05/22: Impression  Blood throughout the colon with pan-diverticula, suspect etiology of bleed  No blood in the TI  Prep was inadequate Repeat colonoscopy recommended in 6 months

## 2023-08-24 DIAGNOSIS — Z23 Encounter for immunization: Secondary | ICD-10-CM | POA: Diagnosis not present

## 2023-08-24 NOTE — Telephone Encounter (Signed)
Called patient to advise and schedule left voicemail.

## 2023-09-30 ENCOUNTER — Other Ambulatory Visit: Payer: Self-pay | Admitting: Family Medicine

## 2023-09-30 DIAGNOSIS — E782 Mixed hyperlipidemia: Secondary | ICD-10-CM

## 2023-11-12 DIAGNOSIS — R059 Cough, unspecified: Secondary | ICD-10-CM | POA: Diagnosis not present

## 2023-11-12 DIAGNOSIS — R0981 Nasal congestion: Secondary | ICD-10-CM | POA: Diagnosis not present

## 2023-11-12 DIAGNOSIS — J159 Unspecified bacterial pneumonia: Secondary | ICD-10-CM | POA: Diagnosis not present

## 2023-11-15 DIAGNOSIS — J158 Pneumonia due to other specified bacteria: Secondary | ICD-10-CM | POA: Diagnosis not present

## 2024-01-02 ENCOUNTER — Other Ambulatory Visit: Payer: Self-pay | Admitting: Family Medicine

## 2024-01-02 DIAGNOSIS — E782 Mixed hyperlipidemia: Secondary | ICD-10-CM

## 2024-01-25 NOTE — Patient Instructions (Incomplete)
 It was good to see you again today, I will be in touch with your labs If not done already recommend dose of RSV vaccine and shingles series-Shingrix- at your pharmacy Can also get a covid booster   [facility] I believe is happy to see you- I think you will be seeing Dr Leonides Schanz.  Give them a call- you are due for a screening colonoscopy  Address: [address], [place], [place] [address] Phone: [phone]  Please get me a copy of your recent chest x-ray report- the radiology read is what I need, not the actual images   I think seeing a chiro for your neck and back is a fine idea

## 2024-01-25 NOTE — Progress Notes (Unsigned)
 [facility] at [facility] [address], [address] [place], [place] [address] [phone]  Date:  01/26/2024   Name:  Chad Cox   DOB:  [birth year]   MRN:  [id number]  PCP:  Pearline Cables, MD    Chief Complaint: No chief complaint on file.   History of Present Illness:  Chad Cox is a 73 y.o. 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 Leonides Schanz Most recent colonoscopy 4/23-repeat was actually recommended in 6 months due to bleeding and poor prep  My partner Alfredia Ferguson saw him in October and did iron labs; hemoglobin 10.1, ferritin 4.6  COVID-19 booster Update labs Shingrix Recommend RSV  Glipizide XL 10 Losartan 50 Metformin at thousand twice daily Protonix Pravastatin  Never smoker Patient Active Problem List   Diagnosis Date Noted   Iron deficiency anemia due to chronic blood loss 02/06/2020   Diverticulitis of colon with bleeding 02/06/2020   Kidney pain 01/11/2019   Hematuria 01/11/2019   Bile leak, postoperative 09/26/2018   Gallstones and inflammation of gallbladder without obstruction 09/20/2018   Cholelithiasis and cholecystitis without obstruction 09/20/2018   Obesity, unspecified 04/29/2014   Diabetes mellitus (HCC) 01/30/2012   Hypertension 01/30/2012   Dyslipidemia 01/30/2012    Past Medical History:  Diagnosis Date   Bile leak, postoperative 09/26/2018   Cholecystitis    Diabetes mellitus without complication (HCC)    Diverticulitis of colon with bleeding 02/06/2020   Gallstones and inflammation of gallbladder without obstruction 09/20/2018    Hypertension    Iron deficiency anemia due to chronic blood loss 02/06/2020   Kidney stone     Past Surgical History:  Procedure Laterality Date   CHOLECYSTECTOMY N/A 09/20/2018   Procedure: LAPAROSCOPIC CHOLECYSTECTOMY WITH PRIMARY UMBILICAL HERNIA REPAIR;  Surgeon: Claud Kelp, MD;  Location: [facility];  Service: General;  Laterality: N/A;   ERCP N/A 09/21/2018   Procedure: ENDOSCOPIC RETROGRADE CHOLANGIOPANCREATOGRAPHY (ERCP);  Surgeon: Jeani Hawking, MD;  Location: [facility];  Service: Endoscopy;  Laterality: N/A;   LAPAROSCOPY N/A 09/21/2018   Procedure: LAPAROSCOPY DIAGNOSTIC, WASHOUT, DRAIN PLACEMENT;  Surgeon: Romie Levee, MD;  Location: [facility];  Service: General;  Laterality: N/A;   WISDOM TOOTH EXTRACTION      Social History   Tobacco Use   Smoking status: Never   Smokeless tobacco: Never  Substance Use Topics   Alcohol use: Yes    Comment: occ   Drug use: No    Family History  Problem Relation Age of Onset   Atrial fibrillation Mother     Allergies  Allergen Reactions   Adhesive [Tape] Other (See Comments)    "acts like poison ivy" blisters up, itching   Betadine [Povidone Iodine] Other (See Comments)    Blisters, itching    Medication list has been reviewed and updated.  Current Outpatient Medications on File Prior to Visit  Medication Sig Dispense Refill   acetaminophen (TYLENOL) 650 MG CR tablet Take 650 mg by mouth at bedtime.     finasteride (PROSCAR) 5 MG tablet Take 1 tablet (5 mg total) by mouth daily. (Patient not taking: Reported  on 08/16/2023) 30 tablet 3   glipiZIDE (GLUCOTROL XL) 10 MG 24 hr tablet Take 1 tablet (10 mg total) by mouth daily with breakfast. 90 tablet 3   losartan (COZAAR) 50 MG tablet Take 1 tablet (50 mg total) by mouth daily. 90 tablet 3   metFORMIN (GLUCOPHAGE) 1000 MG tablet Take 1 tablet (1,000 mg total) by mouth 2 (two) times daily with a meal. 180 tablet 3   Multiple Vitamin (MULTIVITAMIN) tablet Take 1 tablet by  mouth daily.     pantoprazole (PROTONIX) 40 MG tablet TAKE 1 TABLET(40 MG) BY MOUTH DAILY 90 tablet 3   pravastatin (PRAVACHOL) 80 MG tablet TAKE 1 TABLET(80 MG) BY MOUTH DAILY 90 tablet 0   No current facility-administered medications on file prior to visit.    Review of Systems:  As per HPI- otherwise negative.   Physical Examination: There were no vitals filed for this visit. There were no vitals filed for this visit. There is no height or weight on file to calculate BMI. Ideal Body Weight:    GEN: no acute distress. HEENT: Atraumatic, Normocephalic.  Ears and Nose: No external deformity. CV: RRR, No M/G/R. No JVD. No thrill. No extra heart sounds. PULM: CTA B, no wheezes, crackles, rhonchi. No retractions. No resp. distress. No accessory muscle use. ABD: S, NT, ND, +BS. No rebound. No HSM. EXTR: No c/c/e PSYCH: Normally interactive. Conversant.    Assessment and Plan: ***  Signed Abbe Amsterdam, MD

## 2024-01-26 ENCOUNTER — Ambulatory Visit: Admitting: Family Medicine

## 2024-01-26 ENCOUNTER — Encounter: Payer: Self-pay | Admitting: Family

## 2024-01-26 VITALS — BP 142/80 | HR 67 | Temp 97.7°F | Resp 18 | Ht 72.0 in | Wt 229.6 lb

## 2024-01-26 DIAGNOSIS — E119 Type 2 diabetes mellitus without complications: Secondary | ICD-10-CM | POA: Diagnosis not present

## 2024-01-26 DIAGNOSIS — I10 Essential (primary) hypertension: Secondary | ICD-10-CM | POA: Diagnosis not present

## 2024-01-26 DIAGNOSIS — K219 Gastro-esophageal reflux disease without esophagitis: Secondary | ICD-10-CM

## 2024-01-26 DIAGNOSIS — N401 Enlarged prostate with lower urinary tract symptoms: Secondary | ICD-10-CM

## 2024-01-26 DIAGNOSIS — R350 Frequency of micturition: Secondary | ICD-10-CM

## 2024-01-26 DIAGNOSIS — Z125 Encounter for screening for malignant neoplasm of prostate: Secondary | ICD-10-CM | POA: Diagnosis not present

## 2024-01-26 DIAGNOSIS — D509 Iron deficiency anemia, unspecified: Secondary | ICD-10-CM | POA: Diagnosis not present

## 2024-01-26 DIAGNOSIS — E782 Mixed hyperlipidemia: Secondary | ICD-10-CM

## 2024-01-26 DIAGNOSIS — D649 Anemia, unspecified: Secondary | ICD-10-CM | POA: Diagnosis not present

## 2024-01-26 DIAGNOSIS — Z7985 Long-term (current) use of injectable non-insulin antidiabetic drugs: Secondary | ICD-10-CM

## 2024-01-26 DIAGNOSIS — Z7984 Long term (current) use of oral hypoglycemic drugs: Secondary | ICD-10-CM

## 2024-01-26 LAB — LIPID PANEL
Cholesterol: 160 mg/dL (ref 0–200)
HDL: 39.9 mg/dL (ref >39.00)
LDL Cholesterol: 94 mg/dL (ref 0–99)
NonHDL: 120.21
Total CHOL/HDL Ratio: 4
Triglycerides: 130 mg/dL (ref 0.0–149.0)
VLDL: 26 mg/dL (ref 0.0–40.0)

## 2024-01-26 LAB — FERRITIN: Ferritin: 4.7 ng/mL — ABNORMAL LOW (ref 22.0–322.0)

## 2024-01-26 LAB — COMPREHENSIVE METABOLIC PANEL
ALT: 10 U/L (ref 0–53)
AST: 12 U/L (ref 0–37)
Albumin: 4.5 g/dL (ref 3.5–5.2)
Alkaline Phosphatase: 49 U/L (ref 39–117)
BUN: 13 mg/dL (ref 6–23)
CO2: 25 meq/L (ref 19–32)
Calcium: 9 mg/dL (ref 8.4–10.5)
Chloride: 105 meq/L (ref 96–112)
Creatinine, Ser: 0.93 mg/dL (ref 0.40–1.50)
GFR: 82.24 mL/min (ref >60.00)
Glucose, Bld: 246 mg/dL — ABNORMAL HIGH (ref 70–99)
Potassium: 3.9 meq/L (ref 3.5–5.1)
Sodium: 139 meq/L (ref 135–145)
Total Bilirubin: 0.5 mg/dL (ref 0.2–1.2)
Total Protein: 6.8 g/dL (ref 6.0–8.3)

## 2024-01-26 LAB — HEMOGLOBIN A1C: Hgb A1c MFr Bld: 10.1 % — ABNORMAL HIGH (ref 4.6–6.5)

## 2024-01-26 LAB — CBC
HCT: 33.7 % — ABNORMAL LOW (ref 39.0–52.0)
Hemoglobin: 10.9 g/dL — ABNORMAL LOW (ref 13.0–17.0)
MCHC: 32.3 g/dL (ref 30.0–36.0)
MCV: 85.5 fl (ref 78.0–100.0)
Platelets: 296 10*3/uL (ref 150.0–400.0)
RBC: 3.94 Mil/uL — ABNORMAL LOW (ref 4.22–5.81)
RDW: 15.8 % — ABNORMAL HIGH (ref 11.5–15.5)
WBC: 5.4 10*3/uL (ref 4.0–10.5)

## 2024-01-26 LAB — PSA: PSA: 2.81 ng/mL (ref 0.10–4.00)

## 2024-01-26 MED ORDER — PRAVASTATIN SODIUM 80 MG PO TABS: 80.0000 mg | ORAL_TABLET | Freq: Every day | ORAL | 3 refills | Status: DC

## 2024-01-26 MED ORDER — LOSARTAN POTASSIUM 50 MG PO TABS: 50.0000 mg | ORAL_TABLET | Freq: Every day | ORAL | 3 refills | Status: AC

## 2024-01-26 MED ORDER — METFORMIN HCL 1000 MG PO TABS: 1000.0000 mg | ORAL_TABLET | Freq: Two times a day (BID) | ORAL | 3 refills | Status: AC

## 2024-01-26 MED ORDER — GLIPIZIDE ER 10 MG PO TB24: 10.0000 mg | ORAL_TABLET | Freq: Every day | ORAL | 3 refills | Status: AC

## 2024-01-26 MED ORDER — PANTOPRAZOLE SODIUM 40 MG PO TBEC: DELAYED_RELEASE_TABLET | ORAL | 3 refills | Status: AC

## 2024-01-27 ENCOUNTER — Encounter: Payer: Self-pay | Admitting: Family Medicine

## 2024-01-27 NOTE — Addendum Note (Signed)
 Addended by: Abbe Amsterdam C on: 01/27/2024 05:22 AM   Modules accepted: Orders

## 2024-01-31 ENCOUNTER — Inpatient Hospital Stay (HOSPITAL_COMMUNITY)
Admission: EM | Admit: 2024-01-31 | Discharge: 2024-02-03 | DRG: 811 | Disposition: A | Discharge status: Home / Self Care | Attending: Internal Medicine | Admitting: Internal Medicine

## 2024-01-31 ENCOUNTER — Encounter (HOSPITAL_COMMUNITY): Payer: Self-pay | Admitting: Internal Medicine

## 2024-01-31 ENCOUNTER — Other Ambulatory Visit: Payer: Self-pay

## 2024-01-31 ENCOUNTER — Observation Stay (HOSPITAL_COMMUNITY)

## 2024-01-31 ENCOUNTER — Encounter: Payer: Self-pay | Admitting: Family Medicine

## 2024-01-31 DIAGNOSIS — I10 Essential (primary) hypertension: Secondary | ICD-10-CM

## 2024-01-31 DIAGNOSIS — E876 Hypokalemia: Secondary | ICD-10-CM | POA: Diagnosis present

## 2024-01-31 DIAGNOSIS — E66811 Obesity, class 1: Secondary | ICD-10-CM | POA: Diagnosis not present

## 2024-01-31 DIAGNOSIS — Z6831 Body mass index (BMI) 31.0-31.9, adult: Secondary | ICD-10-CM

## 2024-01-31 DIAGNOSIS — D509 Iron deficiency anemia, unspecified: Secondary | ICD-10-CM

## 2024-01-31 DIAGNOSIS — K5731 Diverticulosis of large intestine without perforation or abscess with bleeding: Principal | ICD-10-CM | POA: Diagnosis present

## 2024-01-31 DIAGNOSIS — E1165 Type 2 diabetes mellitus with hyperglycemia: Secondary | ICD-10-CM | POA: Diagnosis not present

## 2024-01-31 DIAGNOSIS — Z87442 Personal history of urinary calculi: Secondary | ICD-10-CM

## 2024-01-31 DIAGNOSIS — E785 Hyperlipidemia, unspecified: Secondary | ICD-10-CM | POA: Diagnosis not present

## 2024-01-31 DIAGNOSIS — K922 Gastrointestinal hemorrhage, unspecified: Secondary | ICD-10-CM | POA: Diagnosis not present

## 2024-01-31 DIAGNOSIS — D62 Acute posthemorrhagic anemia: Principal | ICD-10-CM | POA: Diagnosis present

## 2024-01-31 DIAGNOSIS — Z79899 Other long term (current) drug therapy: Secondary | ICD-10-CM

## 2024-01-31 DIAGNOSIS — Z7984 Long term (current) use of oral hypoglycemic drugs: Secondary | ICD-10-CM

## 2024-01-31 DIAGNOSIS — R55 Syncope and collapse: Secondary | ICD-10-CM | POA: Diagnosis present

## 2024-01-31 DIAGNOSIS — W19XXXA Unspecified fall, initial encounter: Secondary | ICD-10-CM | POA: Diagnosis present

## 2024-01-31 DIAGNOSIS — I959 Hypotension, unspecified: Secondary | ICD-10-CM | POA: Diagnosis present

## 2024-01-31 DIAGNOSIS — R195 Other fecal abnormalities: Secondary | ICD-10-CM | POA: Insufficient documentation

## 2024-01-31 LAB — CBC
HCT: 22.3 % — ABNORMAL LOW (ref 39.0–52.0)
HCT: 23.5 % — ABNORMAL LOW (ref 39.0–52.0)
Hemoglobin: 7.1 g/dL — ABNORMAL LOW (ref 13.0–17.0)
Hemoglobin: 7.4 g/dL — ABNORMAL LOW (ref 13.0–17.0)
MCH: 28 pg (ref 26.0–34.0)
MCH: 27.4 pg (ref 26.0–34.0)
MCHC: 31.8 g/dL (ref 30.0–36.0)
MCHC: 31.5 g/dL (ref 30.0–36.0)
MCV: 87.8 fL (ref 80.0–100.0)
MCV: 87 fL (ref 80.0–100.0)
Platelets: 204 10*3/uL (ref 150–400)
Platelets: 221 10*3/uL (ref 150–400)
RBC: 2.54 MIL/uL — ABNORMAL LOW (ref 4.22–5.81)
RBC: 2.7 MIL/uL — ABNORMAL LOW (ref 4.22–5.81)
RDW: 14.4 % (ref 11.5–15.5)
RDW: 14.4 % (ref 11.5–15.5)
WBC: 7 10*3/uL (ref 4.0–10.5)
WBC: 7.5 10*3/uL (ref 4.0–10.5)
nRBC: 0 % (ref 0.0–0.2)
nRBC: 0 % (ref 0.0–0.2)

## 2024-01-31 LAB — CBC WITH DIFFERENTIAL/PLATELET
Abs Immature Granulocytes: 0.02 10*3/uL (ref 0.00–0.07)
Basophils Absolute: 0.1 10*3/uL (ref 0.0–0.1)
Basophils Relative: 1 %
Eosinophils Absolute: 0.3 10*3/uL (ref 0.0–0.5)
Eosinophils Relative: 4 %
HCT: 31.7 % — ABNORMAL LOW (ref 39.0–52.0)
Hemoglobin: 9.8 g/dL — ABNORMAL LOW (ref 13.0–17.0)
Immature Granulocytes: 0 %
Lymphocytes Relative: 16 %
Lymphs Abs: 1 10*3/uL (ref 0.7–4.0)
MCH: 27.2 pg (ref 26.0–34.0)
MCHC: 30.9 g/dL (ref 30.0–36.0)
MCV: 88.1 fL (ref 80.0–100.0)
Monocytes Absolute: 0.5 10*3/uL (ref 0.1–1.0)
Monocytes Relative: 8 %
Neutro Abs: 4.1 10*3/uL (ref 1.7–7.7)
Neutrophils Relative %: 71 %
Platelets: 269 10*3/uL (ref 150–400)
RBC: 3.6 MIL/uL — ABNORMAL LOW (ref 4.22–5.81)
RDW: 14.3 % (ref 11.5–15.5)
WBC: 5.9 10*3/uL (ref 4.0–10.5)
nRBC: 0 % (ref 0.0–0.2)

## 2024-01-31 LAB — COMPREHENSIVE METABOLIC PANEL
ALT: 13 U/L (ref 0–44)
AST: 17 U/L (ref 15–41)
Albumin: 3.9 g/dL (ref 3.5–5.0)
Alkaline Phosphatase: 46 U/L (ref 38–126)
Anion gap: 12 (ref 5–15)
BUN: 15 mg/dL (ref 8–23)
CO2: 20 mmol/L — ABNORMAL LOW (ref 22–32)
Calcium: 8.7 mg/dL — ABNORMAL LOW (ref 8.9–10.3)
Chloride: 105 mmol/L (ref 98–111)
Creatinine, Ser: 0.98 mg/dL (ref 0.61–1.24)
GFR, Estimated: >60 mL/min (ref >60)
Glucose, Bld: 353 mg/dL — ABNORMAL HIGH (ref 70–99)
Potassium: 3.8 mmol/L (ref 3.5–5.1)
Sodium: 137 mmol/L (ref 135–145)
Total Bilirubin: 0.4 mg/dL (ref 0.0–1.2)
Total Protein: 7 g/dL (ref 6.5–8.1)

## 2024-01-31 LAB — ABO/RH: ABO/RH(D): O POS

## 2024-01-31 LAB — GLUCOSE, CAPILLARY
Glucose-Capillary: 300 mg/dL — ABNORMAL HIGH (ref 70–99)
Glucose-Capillary: 255 mg/dL — ABNORMAL HIGH (ref 70–99)
Glucose-Capillary: 193 mg/dL — ABNORMAL HIGH (ref 70–99)

## 2024-01-31 LAB — VITAMIN B12: Vitamin B-12: 164 pg/mL — ABNORMAL LOW (ref 180–914)

## 2024-01-31 LAB — PROTIME-INR
INR: 1 (ref 0.8–1.2)
Prothrombin Time: 13.8 s (ref 11.4–15.2)

## 2024-01-31 MED ORDER — INSULIN ASPART 100 UNIT/ML IJ SOLN
0.0000 [IU] | Freq: Three times a day (TID) | INTRAMUSCULAR | Status: DC
  Administered 2024-01-31: 5 [IU] via SUBCUTANEOUS
  Filled 2024-01-31: qty 0.09

## 2024-01-31 MED ORDER — INSULIN ASPART 100 UNIT/ML IJ SOLN
0.0000 [IU] | Freq: Three times a day (TID) | INTRAMUSCULAR | Status: DC
  Administered 2024-01-31: 11 [IU] via SUBCUTANEOUS
  Administered 2024-02-01 (×3): 7 [IU] via SUBCUTANEOUS
  Administered 2024-02-02 (×2): 4 [IU] via SUBCUTANEOUS
  Administered 2024-02-02 – 2024-02-03 (×3): 7 [IU] via SUBCUTANEOUS

## 2024-01-31 MED ORDER — PANTOPRAZOLE SODIUM 40 MG PO TBEC: 40.0000 mg | DELAYED_RELEASE_TABLET | Freq: Every day | ORAL | Status: DC

## 2024-01-31 MED ORDER — ORAL CARE MOUTH RINSE: 15.0000 mL | OROMUCOSAL | Status: DC | PRN

## 2024-01-31 MED ORDER — INSULIN ASPART 100 UNIT/ML IJ SOLN
0.0000 [IU] | Freq: Every day | INTRAMUSCULAR | Status: DC
  Administered 2024-02-01 – 2024-02-02 (×2): 2 [IU] via SUBCUTANEOUS
  Filled 2024-01-31: qty 0.05

## 2024-01-31 MED ORDER — SODIUM CHLORIDE 0.9 % IV BOLUS
1000.0000 mL | Freq: Once | INTRAVENOUS | Status: DC
Start: 2024-01-31 — End: 2024-02-03

## 2024-01-31 MED ORDER — PANTOPRAZOLE SODIUM 40 MG PO TBEC
40.0000 mg | DELAYED_RELEASE_TABLET | Freq: Two times a day (BID) | ORAL | Status: DC
  Administered 2024-01-31 – 2024-02-03 (×6): 40 mg via ORAL
  Filled 2024-01-31 (×6): qty 1

## 2024-01-31 MED ORDER — ACETAMINOPHEN 325 MG PO TABS
650.0000 mg | ORAL_TABLET | Freq: Four times a day (QID) | ORAL | Status: DC | PRN
  Administered 2024-01-31: 650 mg via ORAL
  Administered 2024-02-01 (×2): 325 mg via ORAL
  Filled 2024-01-31 (×2): qty 2

## 2024-01-31 MED ORDER — CHLORHEXIDINE GLUCONATE CLOTH 2 % EX PADS
6.0000 | MEDICATED_PAD | Freq: Every day | CUTANEOUS | Status: DC
  Administered 2024-01-31 – 2024-02-01 (×2): 6 via TOPICAL

## 2024-01-31 MED ORDER — ACETAMINOPHEN 650 MG RE SUPP: 650.0000 mg | Freq: Four times a day (QID) | RECTAL | Status: DC | PRN

## 2024-01-31 MED ORDER — MELATONIN 5 MG PO TABS
5.0000 mg | ORAL_TABLET | Freq: Once | ORAL | Status: AC | PRN
  Administered 2024-01-31: 5 mg via ORAL
  Filled 2024-01-31: qty 1

## 2024-01-31 MED ORDER — IOHEXOL 350 MG/ML SOLN
100.0000 mL | Freq: Once | INTRAVENOUS | Status: AC | PRN
Start: 2024-01-31 — End: 2024-01-31
  Administered 2024-01-31: 100 mL via INTRAVENOUS

## 2024-01-31 MED ORDER — SODIUM CHLORIDE 0.9 % IV SOLN: INTRAVENOUS | Status: DC

## 2024-01-31 MED ORDER — INSULIN GLARGINE-YFGN 100 UNIT/ML ~~LOC~~ SOLN
10.0000 [IU] | Freq: Every day | SUBCUTANEOUS | Status: DC
  Administered 2024-01-31 – 2024-02-02 (×3): 10 [IU] via SUBCUTANEOUS
  Filled 2024-01-31 (×4): qty 0.1

## 2024-01-31 MED ORDER — ONDANSETRON HCL 4 MG/2ML IJ SOLN
4.0000 mg | Freq: Four times a day (QID) | INTRAMUSCULAR | Status: DC | PRN
  Administered 2024-01-31 – 2024-02-02 (×2): 4 mg via INTRAVENOUS
  Filled 2024-01-31 (×2): qty 2

## 2024-01-31 MED ORDER — PRAVASTATIN SODIUM 20 MG PO TABS
80.0000 mg | ORAL_TABLET | Freq: Every day | ORAL | Status: DC
  Administered 2024-01-31 – 2024-02-03 (×4): 80 mg via ORAL
  Filled 2024-01-31 (×2): qty 4
  Filled 2024-01-31: qty 2
  Filled 2024-01-31 (×2): qty 4

## 2024-01-31 MED ORDER — ONDANSETRON HCL 4 MG PO TABS: 4.0000 mg | ORAL_TABLET | Freq: Four times a day (QID) | ORAL | Status: DC | PRN

## 2024-01-31 NOTE — Progress Notes (Signed)
 Pt was sitting on toilet, syncopal episode, fell forward, found pt on his side, head on the floor, oriented, denies pain, rounding nurse called, MD notified, toilet bowl filled of dark blood, pt pale and clammy, returned to bed via steady lift, lab called to come draw ordered labs stat due to rapid response, pt denies pain. Pt receiving a liter of NS bolus at this time.  Rapid nurses at bedside performing EKG. Pt remains pale and clammy difficult to stay aroused but answers questions all appropriately.

## 2024-01-31 NOTE — H&P (Signed)
 History and Physical    Patient: Chad Cox AOZ:[id number] DOB: [birth year] DOA: 01/31/2024 DOS: the patient was seen and examined on 01/31/2024 PCP: Pearline Cables, MD  Patient coming from: Home  Chief Complaint:  Chief Complaint  Patient presents with   GI Bleeding   HPI: Chad Cox is a 73 y.o. male with medical history significant of poorly controlled type 2 diabetes mellitus, hypertension, dyslipidemia, class I obesity and history of iron deficiency anemia; who presented to the hospital secondary to noticing blood in his stools.  Patient reports 2 episodes of bloody stools since condition started; on his description the whole presentation similar to prior episodes of diverticular bleed.    Patient is hemodynamically stable and reports no chest pain, no shortness of breath, no nausea, no vomiting, no abdominal pain, no dysuria/hematuria, no dizziness/lightheadedness or any other complaints.  TRH has been consulted to place patient in the hospital for further evaluation and management; GI service ([facility]) has been contacted.  Review of Systems: As mentioned in the history of present illness. All other systems reviewed and are negative. Past Medical History:  Diagnosis Date   Bile leak, postoperative 09/26/2018   Cholecystitis    Diabetes mellitus without complication (HCC)    Diverticulitis of colon with bleeding 02/06/2020   Gallstones and inflammation of gallbladder without obstruction 09/20/2018   Hypertension    Iron deficiency anemia due to chronic blood loss 02/06/2020   Kidney stone    Past Surgical History:  Procedure Laterality Date   CHOLECYSTECTOMY N/A 09/20/2018   Procedure: LAPAROSCOPIC CHOLECYSTECTOMY WITH PRIMARY UMBILICAL HERNIA REPAIR;  Surgeon: Claud Kelp, MD;  Location: [facility];  Service: General;  Laterality: N/A;   ERCP N/A 09/21/2018   Procedure: ENDOSCOPIC RETROGRADE CHOLANGIOPANCREATOGRAPHY (ERCP);  Surgeon: Jeani Hawking, MD;   Location: [facility];  Service: Endoscopy;  Laterality: N/A;   LAPAROSCOPY N/A 09/21/2018   Procedure: LAPAROSCOPY DIAGNOSTIC, WASHOUT, DRAIN PLACEMENT;  Surgeon: Romie Levee, MD;  Location: [facility];  Service: General;  Laterality: N/A;   WISDOM TOOTH EXTRACTION     Social History:  reports that he has never smoked. He has never used smokeless tobacco. He reports current alcohol use. He reports that he does not use drugs.  Allergies  Allergen Reactions   Adhesive [Tape] Other (See Comments)    "acts like poison ivy" blisters up, itching   Betadine [Povidone Iodine] Other (See Comments)    Blisters, itching    Family History  Problem Relation Age of Onset   Atrial fibrillation Mother     Prior to Admission medications   Medication Sig Start Date End Date Taking? Authorizing Provider  acetaminophen (TYLENOL) 650 MG CR tablet Take 650 mg by mouth at bedtime.    [provider]  glipiZIDE (GLUCOTROL XL) 10 MG 24 hr tablet Take 1 tablet (10 mg total) by mouth daily with breakfast. 01/26/24   Copland, Gwenlyn Found, MD  losartan (COZAAR) 50 MG tablet Take 1 tablet (50 mg total) by mouth daily. 01/26/24   Copland, Gwenlyn Found, MD  metFORMIN (GLUCOPHAGE) 1000 MG tablet Take 1 tablet (1,000 mg total) by mouth 2 (two) times daily with a meal. 01/26/24   Copland, Gwenlyn Found, MD  Multiple Vitamin (MULTIVITAMIN) tablet Take 1 tablet by mouth daily.    [provider]  pantoprazole (PROTONIX) 40 MG tablet TAKE 1 TABLET(40 MG) BY MOUTH DAILY 01/26/24   Copland, Gwenlyn Found, MD  pravastatin (PRAVACHOL) 80 MG tablet Take 1 tablet (80 mg total) by mouth  daily. 01/26/24   Copland, Gwenlyn Found, MD    Physical Exam: Vitals:   01/31/24 0853 01/31/24 1017  BP: (!) 194/81 126/73  Pulse: 95 80  Resp: 18 19  Temp: 97.7 F (36.5 C)   SpO2: 99% 96%   General exam: Alert, awake, oriented x 3; in no major distress. Respiratory system: Clear to auscultation. Respiratory effort normal.  Good  saturation on room air. Cardiovascular system:RRR. No rubs or gallops; no JVD. Gastrointestinal system: Abdomen is obese, nondistended, soft and nontender. No organomegaly or masses felt. Normal bowel sounds heard. Central nervous system: No focal neurological deficits. Extremities: No C/C/E, +pedal pulses Skin: No rashes, lesions or ulcers Psychiatry: Judgement and insight appear normal. Mood & affect appropriate.   Data Reviewed: Comprehensive metabolic panel: Sodium 137, potassium 3.8, chloride 105, bicarb 20, BUN 15, creatinine 0.98, normal LFTs and GFR >60 CBC: White blood cells 5.9, hemoglobin 9.8 (down from 10.9 5 days ago), MCV 88, platelet count 269K. INR: 1.8   PT 13.8   Assessment and Plan: Lower GI bleed -Hemodynamically stable -Patient reports no abdominal pain or cramps -Follow hemoglobin trend and transfuse as needed -Avoid the use of heparin products and aspirin/NSAIDs. -GI service has been consulted and will follow recommendation -Allowing liquid diet for now. -Type and screening and 2 large IV Bores has been ordered. -Continue PPI  Iron deficiency anemia -Recent ferritin level 4.7, suggesting component of iron deficiency anemia -Patient with prior history of diverticular bleed and concern for chronic blood loss -GI service has been consulted to determine the need for further endoscopic evaluation (endoscopy and/or capsule endoscopy, and decisions on inpatient versus outpatient evaluation).  Patient status post colonoscopy at [facility] roughly 1.5 to 2 years ago; without significant abnormalities other than diverticulosis. -Outpatient follow-up with hematology service for iron infusions recommended. -Will check B12 level.  Poorly controlled diabetes mellitus (HCC) -Poorly controlled type 2 diabetes -Recent A1c 10.1 -Holding oral hypoglycemic agents while inpatient -Modified carbohydrate diet has been discussed with patient -Sliding scale insulin and  Semglee has been ordered -Follow CBG fluctuation.  Class 1 obesity -BMI 31.14 -Low-calorie diet, portion control and increase physical activity discussed with patient.  Hypertension -Stable overall -Heart healthy/low-sodium diet discussed with patient -At the moment holding losartan in order to minimize any confounder in the setting of acute bleeding. -Follow vital signs.  Dyslipidemia -Continue statin.  -Heart healthy diet discussed with patient.    Advance Care Planning:   Code Status: Full Code   Consults: Gastroenterology service  Family Communication: No family at bedside.  Severity of Illness: The appropriate patient status for this patient is OBSERVATION. Observation status is judged to be reasonable and necessary in order to provide the required intensity of service to ensure the patient's safety. The patient's presenting symptoms, physical exam findings, and initial radiographic and laboratory data in the context of their medical condition is felt to place them at decreased risk for further clinical deterioration. Furthermore, it is anticipated that the patient will be medically stable for discharge from the hospital within 2 midnights of admission.   Author: Vassie Loll, MD 01/31/2024 11:18 AM  For on call review [email or web address].

## 2024-01-31 NOTE — Assessment & Plan Note (Signed)
-  Hemodynamically stable -Patient reports no abdominal pain or cramps -Follow hemoglobin trend and transfuse as needed -Avoid the use of heparin products and aspirin/NSAIDs. -GI service has been consulted and will follow recommendation -Allowing liquid diet for now.

## 2024-01-31 NOTE — Assessment & Plan Note (Signed)
-  BMI 31.14 -Low-calorie diet, portion control and increase physical activity discussed with patient.

## 2024-01-31 NOTE — Assessment & Plan Note (Signed)
-  Poorly controlled type 2 diabetes -Recent A1c 10.1 -Holding oral hypoglycemic agents while inpatient -Modified carbohydrate diet has been discussed with patient -Sliding scale insulin and Semglee has been ordered -Follow CBG fluctuation.

## 2024-01-31 NOTE — Assessment & Plan Note (Signed)
 Continue statin.

## 2024-01-31 NOTE — Assessment & Plan Note (Signed)
-  Recent ferritin level 4.7, suggesting component of iron deficiency anemia -Patient with prior history of diverticular bleed and concern for chronic blood loss -GI service has been consulted to determine the need for further endoscopic evaluation (endoscopy and/or capsule endoscopy, and decisions on inpatient versus outpatient evaluation).  Patient status post colonoscopy at [facility] roughly 1.5 to 2 years ago; without significant abnormalities other than diverticulosis. -Outpatient follow-up with hematology service for iron infusions recommended.

## 2024-01-31 NOTE — Significant Event (Signed)
 Rapid Response Event Note   Reason for Call :  Fall  Initial Focused Assessment:  Patient laying in bathroom in fetal position with pillow under head. He had unwitnessed fall where he reports he felt woozy while having bowel movement and lowered himself to knees and then floor. Large bright red bloody BM with large clots in toilet on arrival. Per patient no loss of consciousness. Alert and oriented x4, neuro assessment WNL-no abnormalities. Pupils 3 mm brisk equal bilaterally.   Attempted to get patient back to bed but became diaphoretic, clammy and decreased responsiveness. SBP 70s, HR 60s. Sat back on toilet and another bloody BM occurred-bright red and clots. Got back to bed via steady, BP improved with bolus, alert and oriented on transfer to SD.   1L [place] bolus initiated.    MD paged and to bedside. TX to SD with plans to go straight to CT angio.      Interventions:  TX SD 1L NS bolus    Event Summary:   MD Notified: prior to arrival  Call Time: 1542 Arrival Time: 1546 End Time: 1640  Rosaria Ferries, RN

## 2024-01-31 NOTE — Assessment & Plan Note (Signed)
-  Stable overall -Heart healthy/low-sodium diet discussed with patient -At the moment holding losartan in order to minimize any confounder in the setting of acute bleeding. -Follow vital signs.

## 2024-01-31 NOTE — Plan of Care (Signed)
°  Problem: Education: Goal: Knowledge of General Education information will improve Description: Including pain rating scale, medication(s)/side effects and non-pharmacologic comfort measures Outcome: Progressing   Problem: Clinical Measurements: Goal: Respiratory complications will improve Outcome: Progressing   Problem: Elimination: Goal: Will not experience complications related to urinary retention Outcome: Progressing

## 2024-01-31 NOTE — Progress Notes (Signed)
 Report given to [facility] in ICU/stepdown at bedside.

## 2024-01-31 NOTE — ED Provider Notes (Signed)
 [place] EMERGENCY DEPARTMENT AT [facility] Provider Note   CSN: [id number] Arrival date & time: 01/31/24  1324     History  Chief Complaint  Patient presents with   GI Bleeding    Chad Cox is a 73 y.o. male.  HPI Patient presents with blood in stool.  States he went to work this morning and had a bowel movement with soft stool and states there was blood with it.  Has a history of GI bleed.  Came to the ER and had another bloody bowel movement here.  Thinks his stool was brown.  Has previous history of diverticular bleed.  Does not feel lightheaded or dizzy.  Not on blood thinners.  [facility].   Past Medical History:  Diagnosis Date   Bile leak, postoperative 09/26/2018   Cholecystitis    Diabetes mellitus without complication (HCC)    Diverticulitis of colon with bleeding 02/06/2020   Gallstones and inflammation of gallbladder without obstruction 09/20/2018   Hypertension    Iron deficiency anemia due to chronic blood loss 02/06/2020   Kidney stone     Home Medications Prior to Admission medications   Medication Sig Start Date End Date Taking? Authorizing Provider  acetaminophen (TYLENOL) 650 MG CR tablet Take 650 mg by mouth at bedtime.    [provider]  glipiZIDE (GLUCOTROL XL) 10 MG 24 hr tablet Take 1 tablet (10 mg total) by mouth daily with breakfast. 01/26/24   Copland, Gwenlyn Found, MD  losartan (COZAAR) 50 MG tablet Take 1 tablet (50 mg total) by mouth daily. 01/26/24   Copland, Gwenlyn Found, MD  metFORMIN (GLUCOPHAGE) 1000 MG tablet Take 1 tablet (1,000 mg total) by mouth 2 (two) times daily with a meal. 01/26/24   Copland, Gwenlyn Found, MD  Multiple Vitamin (MULTIVITAMIN) tablet Take 1 tablet by mouth daily.    [provider]  pantoprazole (PROTONIX) 40 MG tablet TAKE 1 TABLET(40 MG) BY MOUTH DAILY 01/26/24   Copland, Gwenlyn Found, MD  pravastatin (PRAVACHOL) 80 MG tablet Take 1 tablet  (80 mg total) by mouth daily. 01/26/24   Copland, Gwenlyn Found, MD      Allergies    Adhesive [tape] and Betadine [povidone iodine]    Review of Systems   Review of Systems  Physical Exam Updated Vital Signs BP (!) 194/81 (BP Location: Left Arm)   Pulse 95   Temp 97.7 F (36.5 C)   Resp 18   SpO2 99%  Physical Exam Vitals and nursing note reviewed.  HENT:     Head: Normocephalic.  Cardiovascular:     Rate and Rhythm: Normal rate.  Abdominal:     Tenderness: There is no abdominal tenderness.  Genitourinary:    Comments: Does have some external blood.  Rectal exam done without tenderness.  No bleeding from hemorrhoid. Skin:    General: Skin is warm.  Neurological:     Mental Status: He is alert and oriented to person, place, and time.     ED Results / Procedures / Treatments   Labs (all labs ordered are listed, but only abnormal results are displayed) Labs Reviewed  CBC WITH DIFFERENTIAL/PLATELET - Abnormal; Notable for the following components:      Result Value   RBC 3.60 (*)    Hemoglobin 9.8 (*)    HCT 31.7 (*)    All other components within normal limits  COMPREHENSIVE METABOLIC PANEL  PROTIME-INR  TYPE AND  SCREEN    EKG None  Radiology No results found.  Procedures Procedures    Medications Ordered in ED Medications  0.9 %  sodium chloride infusion ( Intravenous New Bag/Given 01/31/24 1610)    ED Course/ Medical Decision Making/ A&P                                 Medical Decision Making Amount and/or Complexity of Data Reviewed Labs: ordered.  Risk Prescription drug management. Decision regarding hospitalization.   Patient with GI bleed.  Likely lower GI bleed.  Although differential does include brisk upper GI bleed.  Vitals at this point reassuring.  Will get basic blood work.  Reviewed previous note from GI.  Has had previous diverticular bleed.  Hemoglobin is 9.8.  Decreased from 10.9  5 days ago.  Vitals are reassuring.  Will however  admit for monitoring of his hemoglobin.  Will discuss with [facility].  Will not immediately transfuse.          Final Clinical Impression(s) / ED Diagnoses Final diagnoses:  Lower GI bleed    Rx / DC Orders ED Discharge Orders     None         Benjiman Core, MD 01/31/24 1017

## 2024-01-31 NOTE — Progress Notes (Signed)
 Called to bedside for syncope and further bleeding.  Patient without further BM in the ER, but on arrival to 5E, had a BM that was large volume red blood.  Subsequently syncopized, unwitnessed fall to the ground, patient remembers slowly lowering himself onto knees, didn't strike his head.  HR 70s, BP as low 70s.  Mentation returned quickly to normal.    Will check serial Hgbs.  Discussed with GI, plan for CTA now.  Transfer to higher level of care.

## 2024-01-31 NOTE — ED Triage Notes (Signed)
 Pt states that earlier today he began having "crimson colored" blood in his stool. He had another BM during triage which he described as "bright red". Pt denies abd pain, no blood thinner use. He reports a hx of GI bleeds in the past. No NSAID use per pt.

## 2024-01-31 NOTE — Consult Note (Signed)
 Referring Provider: EDP, Dr. Rubin Payor Primary Care Physician:  Patsy Lager, Gwenlyn Found, MD Primary Gastroenterologist:  Nehemiah Settle primary  Reason for Consultation:  LGIB  HPI: Chad Cox is a 73 y.o. male with past medical history of hypertension, kidney stones, diabetes, cholecystitis complicated by bile leak, and diverticular bleed.  Was hospitalized at [facility] in March 2024 for GI bleed.  Had a CTA that showed diverticular disease, but negative for extravasation.  Underwent colonoscopy there as follows:  Colonoscopy at [facility] 01/2023 for GI bleed with blood throughout the colon with pandiverticulosis.  No blood in the TI.  Thought diverticular bleed.  Repeat recommended in 6 months for CRC screening.  He only went to [facility] because he works in [place] and was transferred to [facility] [employer] [facility] from [facility] at the time.  Was in the process of trying to transfer all of his care to [facility].  Has [place] primary.  Presented here to [facility] today with bright red rectal bleeding.  Is very similar to his episode a year ago.  Says that he was getting ready for work this morning had a bowel movement with soft stool with blood in it.  Had another bloody bowel movement here.  Denies feeling lightheaded or dizzy.  No abdominal pain.  Does not take any blood thinners, NSAIDs, etc. at home.  No nausea or vomiting.  Had a colonoscopy in 2013 with Dr. Loreta Ave that showed only hemorrhoids and pandiverticulosis.  Says that his is actually his 3rd or 4th episode of diverticular bleeding.    Hgb 9.8 grams, down from 10.9 grams just 5 days ago.  Ferritin low at 4.7 last week.   Past Medical History:  Diagnosis Date   Bile leak, postoperative 09/26/2018   Cholecystitis    Diabetes mellitus without complication (HCC)    Diverticulitis of colon with bleeding 02/06/2020   Gallstones and inflammation of gallbladder without obstruction 09/20/2018   Hypertension    Iron deficiency anemia  due to chronic blood loss 02/06/2020   Kidney stone     Past Surgical History:  Procedure Laterality Date   CHOLECYSTECTOMY N/A 09/20/2018   Procedure: LAPAROSCOPIC CHOLECYSTECTOMY WITH PRIMARY UMBILICAL HERNIA REPAIR;  Surgeon: Claud Kelp, MD;  Location: [facility];  Service: General;  Laterality: N/A;   ERCP N/A 09/21/2018   Procedure: ENDOSCOPIC RETROGRADE CHOLANGIOPANCREATOGRAPHY (ERCP);  Surgeon: Jeani Hawking, MD;  Location: [facility];  Service: Endoscopy;  Laterality: N/A;   LAPAROSCOPY N/A 09/21/2018   Procedure: LAPAROSCOPY DIAGNOSTIC, WASHOUT, DRAIN PLACEMENT;  Surgeon: Romie Levee, MD;  Location: [facility];  Service: General;  Laterality: N/A;   WISDOM TOOTH EXTRACTION      Prior to Admission medications   Medication Sig Start Date End Date Taking? Authorizing Provider  acetaminophen (TYLENOL) 650 MG CR tablet Take 650 mg by mouth at bedtime.    [provider]  glipiZIDE (GLUCOTROL XL) 10 MG 24 hr tablet Take 1 tablet (10 mg total) by mouth daily with breakfast. 01/26/24   Copland, Gwenlyn Found, MD  losartan (COZAAR) 50 MG tablet Take 1 tablet (50 mg total) by mouth daily. 01/26/24   Copland, Gwenlyn Found, MD  metFORMIN (GLUCOPHAGE) 1000 MG tablet Take 1 tablet (1,000 mg total) by mouth 2 (two) times daily with a meal. 01/26/24   Copland, Gwenlyn Found, MD  Multiple Vitamin (MULTIVITAMIN) tablet Take 1 tablet by mouth daily.    [provider]  pantoprazole (PROTONIX) 40 MG tablet TAKE 1 TABLET(40 MG) BY MOUTH DAILY 01/26/24  Copland, Gwenlyn Found, MD  pravastatin (PRAVACHOL) 80 MG tablet Take 1 tablet (80 mg total) by mouth daily. 01/26/24   Copland, Gwenlyn Found, MD    Current Facility-Administered Medications  Medication Dose Route Frequency Provider Last Rate Last Admin   0.9 %  sodium chloride infusion   Intravenous Continuous Vassie Loll, MD 125 mL/hr at 01/31/24 0952 New Bag at 01/31/24 0952   pantoprazole (PROTONIX) EC tablet 40 mg  40 mg Oral Daily Vassie Loll, MD       pravastatin (PRAVACHOL) tablet 80 mg  80 mg Oral Daily Vassie Loll, MD       Current Outpatient Medications  Medication Sig Dispense Refill   acetaminophen (TYLENOL) 650 MG CR tablet Take 650 mg by mouth at bedtime.     glipiZIDE (GLUCOTROL XL) 10 MG 24 hr tablet Take 1 tablet (10 mg total) by mouth daily with breakfast. 90 tablet 3   losartan (COZAAR) 50 MG tablet Take 1 tablet (50 mg total) by mouth daily. 90 tablet 3   metFORMIN (GLUCOPHAGE) 1000 MG tablet Take 1 tablet (1,000 mg total) by mouth 2 (two) times daily with a meal. 180 tablet 3   Multiple Vitamin (MULTIVITAMIN) tablet Take 1 tablet by mouth daily.     pantoprazole (PROTONIX) 40 MG tablet TAKE 1 TABLET(40 MG) BY MOUTH DAILY 90 tablet 3   pravastatin (PRAVACHOL) 80 MG tablet Take 1 tablet (80 mg total) by mouth daily. 90 tablet 3    Allergies as of 01/31/2024 - Review Complete 01/31/2024  Allergen Reaction Noted   Adhesive [tape] Other (See Comments) 01/30/2012   Betadine [povidone iodine] Other (See Comments) 01/30/2012    Family History  Problem Relation Age of Onset   Atrial fibrillation Mother     Social History   Socioeconomic History   Marital status: Single    Spouse name: Not on file   Number of children: Not on file   Years of education: Not on file   Highest education level: Bachelor's degree (e.g., BA, AB, BS)  Occupational History   Not on file  Tobacco Use   Smoking status: Never   Smokeless tobacco: Never  Substance and Sexual Activity   Alcohol use: Yes    Comment: occ   Drug use: No   Sexual activity: Never  Other Topics Concern   Not on file  Social History Narrative   Not on file   Social Drivers of Health   Financial Resource Strain: Low Risk  (03/09/2023)   Overall Financial Resource Strain (CARDIA)    Difficulty of Paying Living Expenses: Not hard at all  Food Insecurity: No Food Insecurity (03/09/2023)   Hunger Vital Sign    Worried About Running Out of Food in  the Last Year: Never true    Ran Out of Food in the Last Year: Never true  Transportation Needs: No Transportation Needs (03/09/2023)   PRAPARE - Administrator, Civil Service (Medical): No    Lack of Transportation (Non-Medical): No  Physical Activity: Unknown (03/09/2023)   Exercise Vital Sign    Days of Exercise per Week: Patient declined    Minutes of Exercise per Session: Not on file  Stress: No Stress Concern Present (03/09/2023)   [employer] of Occupational Health - Occupational Stress Questionnaire    Feeling of Stress : Not at all  Social Connections: Unknown (03/09/2023)   Social Connection and Isolation Panel [NHANES]    Frequency of Communication with Friends and Family: More  than three times a week    Frequency of Social Gatherings with Friends and Family: More than three times a week    Attends Religious Services: Patient declined    Database administrator or Organizations: No    Attends Engineer, structural: Not on file    Marital Status: Patient declined  Intimate Partner Violence: Not on file    Review of Systems: ROS is O/W negative except as mentioned in HPI.  Physical Exam: Vital signs in last 24 hours: Temp:  [97.7 F (36.5 C)] 97.7 F (36.5 C) (03/25 0853) Pulse Rate:  [80-95] 80 (03/25 1017) Resp:  [18-19] 19 (03/25 1017) BP: (126-194)/(73-81) 126/73 (03/25 1017) SpO2:  [96 %-99 %] 96 % (03/25 1017)   General:  Alert, Well-developed, well-nourished, pleasant and cooperative in NAD Head:  Normocephalic and atraumatic. Eyes:  Sclera clear, no icterus.  Conjunctiva pink. Ears:  Normal auditory acuity. Mouth:  No deformity or lesions.   Lungs:  Clear throughout to auscultation.  No wheezes, crackles, or rhonchi.  Heart:  Regular rate and rhythm; no murmurs, clicks, rubs, or gallops. Abdomen:  Soft, non-distended.  BS present.  Non-tender. Msk:  Symmetrical without gross deformities.  Pulses:  Normal pulses noted. Extremities:   Without clubbing or edema. Neurologic:  Alert and oriented x 4;  grossly normal neurologically. Skin:  Intact without significant lesions or rashes. Psych:  Alert and cooperative. Normal mood and affect.  Lab Results: Recent Labs    01/31/24 0939  WBC 5.9  HGB 9.8*  HCT 31.7*  PLT 269   BMET Recent Labs    01/31/24 0939  NA 137  K 3.8  CL 105  CO2 20*  GLUCOSE 353*  BUN 15  CREATININE 0.98  CALCIUM 8.7*   LFT Recent Labs    01/31/24 0939  PROT 7.0  ALBUMIN 3.9  AST 17  ALT 13  ALKPHOS 46  BILITOT 0.4   IMPRESSION:  *LGIB:  Red blood in stool that started this morning.  Has history of diverticular bleed in 01/2023 and one or two other episodes in the past as well.  This sounds similar.  Suspect the same.  Colonoscopy 01/2023 at [facility] during similar episode with blood throughout the colon but no active bleeding source seen, no blood in the TI. *IDA:  Ferritin low last week and iron studies all low previously.  PLAN: -Will plan for observation, supportive care. -If continues to bleed briskly then consider CTA. -Monitor hemoglobin and transfuse further if needed. -Consider IV iron infusions as outpatient. -Ideally should consider both EGD and colonoscopy as outpatient not acutely bleeding to get a good screening exam, evaluate further for his iron deficiency, etc.   Princella Pellegrini. Sidni Fusco  01/31/2024, 10:36 AM

## 2024-02-01 DIAGNOSIS — D509 Iron deficiency anemia, unspecified: Secondary | ICD-10-CM | POA: Diagnosis present

## 2024-02-01 DIAGNOSIS — Z7984 Long term (current) use of oral hypoglycemic drugs: Secondary | ICD-10-CM | POA: Diagnosis not present

## 2024-02-01 DIAGNOSIS — K922 Gastrointestinal hemorrhage, unspecified: Secondary | ICD-10-CM | POA: Diagnosis present

## 2024-02-01 DIAGNOSIS — E876 Hypokalemia: Secondary | ICD-10-CM | POA: Diagnosis present

## 2024-02-01 DIAGNOSIS — W19XXXA Unspecified fall, initial encounter: Secondary | ICD-10-CM | POA: Diagnosis present

## 2024-02-01 DIAGNOSIS — E1165 Type 2 diabetes mellitus with hyperglycemia: Secondary | ICD-10-CM | POA: Diagnosis present

## 2024-02-01 DIAGNOSIS — E66811 Obesity, class 1: Secondary | ICD-10-CM | POA: Diagnosis present

## 2024-02-01 DIAGNOSIS — I10 Essential (primary) hypertension: Secondary | ICD-10-CM | POA: Diagnosis present

## 2024-02-01 DIAGNOSIS — R55 Syncope and collapse: Secondary | ICD-10-CM | POA: Diagnosis present

## 2024-02-01 DIAGNOSIS — E785 Hyperlipidemia, unspecified: Secondary | ICD-10-CM | POA: Diagnosis present

## 2024-02-01 DIAGNOSIS — Z79899 Other long term (current) drug therapy: Secondary | ICD-10-CM | POA: Diagnosis not present

## 2024-02-01 DIAGNOSIS — Z6831 Body mass index (BMI) 31.0-31.9, adult: Secondary | ICD-10-CM | POA: Diagnosis not present

## 2024-02-01 DIAGNOSIS — I959 Hypotension, unspecified: Secondary | ICD-10-CM | POA: Diagnosis present

## 2024-02-01 DIAGNOSIS — D62 Acute posthemorrhagic anemia: Secondary | ICD-10-CM | POA: Diagnosis present

## 2024-02-01 DIAGNOSIS — K5731 Diverticulosis of large intestine without perforation or abscess with bleeding: Secondary | ICD-10-CM | POA: Diagnosis present

## 2024-02-01 DIAGNOSIS — Z87442 Personal history of urinary calculi: Secondary | ICD-10-CM | POA: Diagnosis not present

## 2024-02-01 LAB — CBC
HCT: 23.2 % — ABNORMAL LOW (ref 39.0–52.0)
HCT: 22.9 % — ABNORMAL LOW (ref 39.0–52.0)
Hemoglobin: 7.3 g/dL — ABNORMAL LOW (ref 13.0–17.0)
Hemoglobin: 7.1 g/dL — ABNORMAL LOW (ref 13.0–17.0)
MCH: 27.7 pg (ref 26.0–34.0)
MCH: 27.4 pg (ref 26.0–34.0)
MCHC: 31.5 g/dL (ref 30.0–36.0)
MCHC: 31 g/dL (ref 30.0–36.0)
MCV: 87.9 fL (ref 80.0–100.0)
MCV: 88.4 fL (ref 80.0–100.0)
Platelets: 228 10*3/uL (ref 150–400)
Platelets: 203 10*3/uL (ref 150–400)
RBC: 2.64 MIL/uL — ABNORMAL LOW (ref 4.22–5.81)
RBC: 2.59 MIL/uL — ABNORMAL LOW (ref 4.22–5.81)
RDW: 14.5 % (ref 11.5–15.5)
RDW: 14.6 % (ref 11.5–15.5)
WBC: 8.2 10*3/uL (ref 4.0–10.5)
WBC: 6.7 10*3/uL (ref 4.0–10.5)
nRBC: 0 % (ref 0.0–0.2)
nRBC: 0 % (ref 0.0–0.2)

## 2024-02-01 LAB — PREPARE RBC (CROSSMATCH)

## 2024-02-01 LAB — BASIC METABOLIC PANEL
Anion gap: 7 (ref 5–15)
BUN: 12 mg/dL (ref 8–23)
CO2: 22 mmol/L (ref 22–32)
Calcium: 8.1 mg/dL — ABNORMAL LOW (ref 8.9–10.3)
Chloride: 108 mmol/L (ref 98–111)
Creatinine, Ser: 0.84 mg/dL (ref 0.61–1.24)
GFR, Estimated: >60 mL/min (ref >60)
Glucose, Bld: 233 mg/dL — ABNORMAL HIGH (ref 70–99)
Potassium: 3.6 mmol/L (ref 3.5–5.1)
Sodium: 137 mmol/L (ref 135–145)

## 2024-02-01 LAB — HEMOGLOBIN AND HEMATOCRIT, BLOOD
HCT: 25.1 % — ABNORMAL LOW (ref 39.0–52.0)
Hemoglobin: 7.7 g/dL — ABNORMAL LOW (ref 13.0–17.0)

## 2024-02-01 LAB — GLUCOSE, CAPILLARY
Glucose-Capillary: 225 mg/dL — ABNORMAL HIGH (ref 70–99)
Glucose-Capillary: 214 mg/dL — ABNORMAL HIGH (ref 70–99)
Glucose-Capillary: 220 mg/dL — ABNORMAL HIGH (ref 70–99)
Glucose-Capillary: 230 mg/dL — ABNORMAL HIGH (ref 70–99)

## 2024-02-01 LAB — MRSA NEXT GEN BY PCR, NASAL: MRSA by PCR Next Gen: NOT DETECTED

## 2024-02-01 MED ORDER — SODIUM CHLORIDE 0.9 % IV SOLN: INTRAVENOUS | Status: AC

## 2024-02-01 MED ORDER — SODIUM CHLORIDE 0.9% IV SOLUTION: Freq: Once | INTRAVENOUS | Status: AC

## 2024-02-01 NOTE — Inpatient Diabetes Management (Signed)
 Inpatient Diabetes Program Recommendations  AACE/ADA: New Consensus Statement on Inpatient Glycemic Control (2015)  Target Ranges:  Prepandial:   less than 140 mg/dL      Peak postprandial:   less than 180 mg/dL (1-2 hours)      Critically ill patients:  140 - 180 mg/dL   Lab Results  Component Value Date   GLUCAP 230 (H) 02/01/2024   HGBA1C 10.1 (H) 01/26/2024    Review of Glycemic Control  Diabetes history: DM2 Outpatient Diabetes medications: glipizide 10 mg dailly, metformin 1000 mg BID Current orders for Inpatient glycemic control: Semglee 10 daily, Novolog 0-20 TID with meals and 0-5 HS  CBGs 225, 214, 220 HgbA1C - 10.1%  Inpatient Diabetes Program Recommendations:    Consider increasing Semglee to 15 units daily  Consider adding Novolog 4 units TID with meals when diet is advanced from FL to CHO mod.  Spoke to pt at bedside regarding his diabetes control and HgbA1C of 10.1%. Discussed glucose and HgbA1C goals. Stressed importance of monitoring blood sugars and taking logbook to MD for review. Explained how hyperglycemia leads to damage within blood vessels which lead to the common complications seen with uncontrolled diabetes. Stressed to the patient the importance of improving glycemic control to prevent further complications from uncontrolled diabetes. Discussed impact of nutrition, exercise, stress, sickness, and medications on diabetes control. May need small amount of insulin to obtain improved glucose control. Pt voices understanding and is appreciative of visit. May be candidate for CGM at discharge if pt will be starting insulin.   Continue to follow.  Thank you. Ailene Ards, RD, LDN, CDCES Inpatient Diabetes Coordinator [phone]

## 2024-02-01 NOTE — Progress Notes (Signed)
 PROGRESS NOTE  Chad Cox ZOX:[id number] DOB: 1951-01-08 DOA: 01/31/2024 PCP: Pearline Cables, MD   LOS: 0 days   Brief Narrative / Interim history: 73 year old male with DM2, HTN, HLD, comes into the hospital with bright red blood per rectum.  He has had 2 episodes at home, has been having diverticular bleeds in the past and this is identical to his prior presentations.  He denies any abdominal pain, no nausea or vomiting.  Shortly after admission onto the floor, had 2 more episodes of bleeding associated with hypotension, lightheadedness, and was transferred to stepdown on 3/25  Subjective / 24h Interval events: Feeling well in bed, no lightheadedness or dizziness.  Has had a little bit of blood in his stool this morning, but nowhere near to how it was last night  Assesement and Plan: Principal Problem:   Lower GI bleed Active Problems:   Hypertension   Dyslipidemia   Iron deficiency anemia   Class 1 obesity   Poorly controlled diabetes mellitus (HCC)  Principal problem Acute lower GI bleed -likely diverticular in nature.  Significant episode of bleeding yesterday associated with hypotension, lightheadedness, requiring transfer to stepdown.  A stat CT angiogram however did not show any source of bleeding.  Clinically bleeding appears to be stopped now -Continue to closely monitor, at risk for decompensation if he bleeds again  Active problems Acute blood loss anemia -continue to closely monitor hemoglobin.  Dipped down after the bleed yesterday but above 7 now.  Transfuse for less than 7.  Recheck CBC this afternoon  Essential hypertension-hold losartan due to intermittent bleed  Hyperlipidemia-continue statin  Obesity, class I-BMI 30.8  DM2-poorly controlled, with hyperglycemia.  Continue sliding scale  Lab Results  Component Value Date   HGBA1C 10.1 (H) 01/26/2024   CBG (last 3)  Recent Labs    01/31/24 1745 01/31/24 2131 02/01/24 0752  GLUCAP 255* 193* 225*     Scheduled Meds:  Chlorhexidine Gluconate Cloth  6 each Topical Daily   insulin aspart  0-20 Units Subcutaneous TID WC   insulin aspart  0-5 Units Subcutaneous QHS   insulin glargine-yfgn  10 Units Subcutaneous Daily   pantoprazole  40 mg Oral BID   pravastatin  80 mg Oral Daily   Continuous Infusions:  sodium chloride 75 mL/hr at 02/01/24 0849   sodium chloride     PRN Meds:.acetaminophen **OR** acetaminophen, ondansetron **OR** ondansetron (ZOFRAN) IV, mouth rinse  Current Outpatient Medications  Medication Instructions   acetaminophen (TYLENOL) 1,300 mg, Daily at bedtime   EXCEDRIN MIGRAINE 250-250-65 MG tablet 2 tablets, Oral, Every 6 hours PRN   glipiZIDE (GLUCOTROL XL) 10 mg, Oral, Daily with breakfast   Lidocaine HCl-Benzyl Alcohol (SALONPAS LIDOCAINE PLUS EX) 1-2 patches, Apply externally, Daily PRN   losartan (COZAAR) 50 mg, Oral, Daily   metFORMIN (GLUCOPHAGE) 1,000 mg, Oral, 2 times daily with meals   Multiple Vitamin (MULTIVITAMIN) tablet 1 tablet, Oral, Daily with breakfast   pantoprazole (PROTONIX) 40 MG tablet TAKE 1 TABLET(40 MG) BY MOUTH DAILY   pravastatin (PRAVACHOL) 80 mg, Oral, Daily    Diet Orders (From admission, onward)     Start     Ordered   01/31/24 1034  Diet full liquid Room service appropriate? Yes; Fluid consistency: Thin  Diet effective now       Question Answer Comment  Room service appropriate? Yes   Fluid consistency: Thin      01/31/24 1039            DVT  prophylaxis: SCDs Start: 01/31/24 1038   Lab Results  Component Value Date   PLT 228 02/01/2024      Code Status: Full Code  Family Communication: no family at bedside   Status is: Observation The patient will require care spanning > 2 midnights and should be moved to inpatient because: GI bleed, ABLA  Level of care: Stepdown  Consultants:  GI  Objective: Vitals:   02/01/24 0800 02/01/24 0810 02/01/24 0900 02/01/24 0936  BP: (!) 113/55  (!) 120/58   Pulse: 81  90 85 87  Resp: 13 14 20  (!) 22  Temp: 98.4 F (36.9 C)     TempSrc: Oral     SpO2: (!) 89% 92% 92% 90%  Weight:      Height:        Intake/Output Summary (Last 24 hours) at 02/01/2024 1015 Last data filed at 02/01/2024 1610 Gross per 24 hour  Intake 1637.28 ml  Output 1535 ml  Net 102.28 ml   Wt Readings from Last 3 Encounters:  01/31/24 103.1 kg  01/26/24 104.1 kg  08/16/23 103.7 kg    Examination:  Constitutional: NAD Eyes: no scleral icterus ENMT: Mucous membranes are moist.  Neck: normal, supple Respiratory: clear to auscultation bilaterally, no wheezing, no crackles.  Cardiovascular: Regular rate and rhythm, no murmurs / rubs / gallops. No LE edema.  Abdomen: non distended, no tenderness. Bowel sounds positive.  Musculoskeletal: no clubbing / cyanosis.    Data Reviewed: I have independently reviewed following labs and imaging studies   CBC Recent Labs  Lab 01/26/24 1003 01/31/24 0939 01/31/24 1659 01/31/24 2225 02/01/24 0422  WBC 5.4 5.9 7.0 7.5 8.2  HGB 10.9* 9.8* 7.1* 7.4* 7.3*  HCT 33.7* 31.7* 22.3* 23.5* 23.2*  PLT 296.0 269 204 221 228  MCV 85.5 88.1 87.8 87.0 87.9  MCH  --  27.2 28.0 27.4 27.7  MCHC 32.3 30.9 31.8 31.5 31.5  RDW 15.8* 14.3 14.4 14.4 14.5  LYMPHSABS  --  1.0  --   --   --   MONOABS  --  0.5  --   --   --   EOSABS  --  0.3  --   --   --   BASOSABS  --  0.1  --   --   --     Recent Labs  Lab 01/26/24 1003 01/31/24 0939 02/01/24 0422  NA 139 137 137  K 3.9 3.8 3.6  CL 105 105 108  CO2 25 20* 22  GLUCOSE 246* 353* 233*  BUN 13 15 12   CREATININE 0.93 0.98 0.84  CALCIUM 9.0 8.7* 8.1*  AST 12 17  --   ALT 10 13  --   ALKPHOS 49 46  --   BILITOT 0.5 0.4  --   ALBUMIN 4.5 3.9  --   INR  --  1.0  --   HGBA1C 10.1*  --   --     ------------------------------------------------------------------------------------------------------------------ No results for input(s): "CHOL", "HDL", "LDLCALC", "TRIG", "CHOLHDL", "LDLDIRECT"  in the last 72 hours.  Lab Results  Component Value Date   HGBA1C 10.1 (H) 01/26/2024   ------------------------------------------------------------------------------------------------------------------ No results for input(s): "TSH", "T4TOTAL", "T3FREE", "THYROIDAB" in the last 72 hours.  Invalid input(s): "FREET3"  Cardiac Enzymes No results for input(s): "CKMB", "TROPONINI", "MYOGLOBIN" in the last 168 hours.  Invalid input(s): "CK" ------------------------------------------------------------------------------------------------------------------ No results found for: "BNP"  CBG: Recent Labs  Lab 01/31/24 1243 01/31/24 1745 01/31/24 2131 02/01/24 0752  GLUCAP 300* 255* 193*  225*    Recent Results (from the past 240 hours)  MRSA Next Gen by PCR, Nasal     Status: None   Collection Time: 01/31/24 11:59 PM   Specimen: Nasal Mucosa; Nasal Swab  Result Value Ref Range Status   MRSA by PCR Next Gen NOT DETECTED NOT DETECTED Final    Comment: (NOTE) The GeneXpert MRSA Assay (FDA approved for NASAL specimens only), is one component of a comprehensive MRSA colonization surveillance program. It is not intended to diagnose MRSA infection nor to guide or monitor treatment for MRSA infections. Test performance is not FDA approved in patients less than 72 years old. Performed at [facility], [address], [place], [place] [address]      Radiology Studies: CT ANGIO GI BLEED Result Date: 01/31/2024 CLINICAL DATA:  Lower GI bleed. EXAM: CTA ABDOMEN AND PELVIS WITHOUT AND WITH CONTRAST TECHNIQUE: Multidetector CT imaging of the abdomen and pelvis was performed using the standard protocol during bolus administration of intravenous contrast. Multiplanar reconstructed images and MIPs were obtained and reviewed to evaluate the vascular anatomy. RADIATION DOSE REDUCTION: This exam was performed according to the departmental dose-optimization program which includes  automated exposure control, adjustment of the mA and/or kV according to patient size and/or use of iterative reconstruction technique. CONTRAST:  OMNIPAQUE IOHEXOL 350 MG/ML SOLN COMPARISON:  CT chest abdomen pelvis dated 08/30/2020. FINDINGS: VASCULAR Aorta: Mild atherosclerotic calcification. No aneurysmal dilatation or dissection. No periaortic fluid collection. Celiac: Patent without evidence of aneurysm, dissection, vasculitis or significant stenosis. SMA: Patent without evidence of aneurysm, dissection, vasculitis or significant stenosis. Renals: Atherosclerotic calcification of the origins of the renal arteries. The renal arteries remain patent. IMA: Narrowing of the origin of the IMA.  The IMA is patent. Inflow: Mild atherosclerotic calcification. No aneurysmal dilatation or dissection. The iliac arteries are patent. Proximal Outflow: The visualized proximal are close patent. Veins: The IVC is unremarkable. The SMV, splenic vein, and main portal vein are patent. No portal venous gas. Review of the MIP images confirms the above findings. NON-VASCULAR Lower chest: The visualized lung bases are clear. No intra-abdominal free air or free fluid. Hepatobiliary: The liver is unremarkable. No biliary dilatation. Cholecystectomy. Pancreas: Faint ill-defined hypoenhancing area in the uncinate process the pancreas, likely present on the prior CT but more conspicuous on today's exam. This may represent interspersed fat or a cluster of small IPMNs. This can be better evaluated with MRI on a nonemergent/outpatient basis. No dilatation of the main pancreatic duct or gland atrophy. No active inflammatory changes. Spleen: Normal in size without focal abnormality. Adrenals/Urinary Tract: The adrenal glands are unremarkable. There is no hydronephrosis or nephrolithiasis on either side. The visualized ureters and urinary bladder appear unremarkable. Stomach/Bowel: There is a small hiatal hernia. There is sigmoid  diverticulosis with muscular hypertrophy. There is no bowel obstruction or active inflammation. No evidence of active GI bleed. The appendix is normal. Lymphatic: No adenopathy. Reproductive: The prostate and seminal vesicles are grossly unremarkable. Other: None Musculoskeletal: Osteopenia with degenerative changes of the spine. No acute osseous pathology. IMPRESSION: 1. No acute intra-abdominal or pelvic pathology. No evidence of active GI bleed. 2. Sigmoid diverticulosis. No bowel obstruction. Normal appendix. Electronically Signed   By: Elgie Collard M.D.   On: 01/31/2024 17:31     Pamella Pert, MD, PhD [facility]  Between 7 am - 7 pm I am available, please contact me via Amion (for emergencies) or Securechat (non urgent messages)  Between 7 pm -  7 am I am not available, please contact night coverage MD/APP via Amion

## 2024-02-01 NOTE — Progress Notes (Signed)
 [facility] Progress Note  CC:  LGIB   Subjective:  Feels ok.  On a full liquid diet.  Bleeding seems to be lessening with just a smear or darker colored blood last occurrence per his nurse.  Objective:  Vital signs in last 24 hours: Temp:  [97.4 F (36.3 C)-98.8 F (37.1 C)] 98.4 F (36.9 C) (03/26 0800) Pulse Rate:  [67-100] 90 (03/26 0810) Resp:  [12-22] 14 (03/26 0810) BP: (105-194)/(50-81) 113/55 (03/26 0800) SpO2:  [88 %-100 %] 92 % (03/26 0810) Weight:  [103.1 kg] 103.1 kg (03/25 1650) Last BM Date : 02/01/24 General:  Alert, Well-developed, in NAD Heart:  Regular rate and rhythm; no murmurs Pulm:  CTAB.  No W/R/R. Abdomen:  Soft, non-distended.  BS present.  Non-tender. Neurologic:  Alert and oriented x 4;  grossly normal neurologically. Psych:  Alert and cooperative. Normal mood and affect.  Intake/Output from previous day: 03/25 0701 - 03/26 0700 In: 1466 [I.V.:1466] Out: 1385 [Urine:1385] Intake/Output this shift: Total I/O In: 171.3 [I.V.:171.3] Out: 150 [Urine:150]  Lab Results: Recent Labs    01/31/24 1659 01/31/24 2225 02/01/24 0422  WBC 7.0 7.5 8.2  HGB 7.1* 7.4* 7.3*  HCT 22.3* 23.5* 23.2*  PLT 204 221 228   BMET Recent Labs    01/31/24 0939 02/01/24 0422  NA 137 137  K 3.8 3.6  CL 105 108  CO2 20* 22  GLUCOSE 353* 233*  BUN 15 12  CREATININE 0.98 0.84  CALCIUM 8.7* 8.1*   LFT Recent Labs    01/31/24 0939  PROT 7.0  ALBUMIN 3.9  AST 17  ALT 13  ALKPHOS 46  BILITOT 0.4   PT/INR Recent Labs    01/31/24 0939  LABPROT 13.8  INR 1.0   CT ANGIO GI BLEED Result Date: 01/31/2024 CLINICAL DATA:  Lower GI bleed. EXAM: CTA ABDOMEN AND PELVIS WITHOUT AND WITH CONTRAST TECHNIQUE: Multidetector CT imaging of the abdomen and pelvis was performed using the standard protocol during bolus administration of intravenous contrast. Multiplanar reconstructed images and MIPs were obtained and reviewed to evaluate the vascular  anatomy. RADIATION DOSE REDUCTION: This exam was performed according to the departmental dose-optimization program which includes automated exposure control, adjustment of the mA and/or kV according to patient size and/or use of iterative reconstruction technique. CONTRAST:  OMNIPAQUE IOHEXOL 350 MG/ML SOLN COMPARISON:  CT chest abdomen pelvis dated 08/30/2020. FINDINGS: VASCULAR Aorta: Mild atherosclerotic calcification. No aneurysmal dilatation or dissection. No periaortic fluid collection. Celiac: Patent without evidence of aneurysm, dissection, vasculitis or significant stenosis. SMA: Patent without evidence of aneurysm, dissection, vasculitis or significant stenosis. Renals: Atherosclerotic calcification of the origins of the renal arteries. The renal arteries remain patent. IMA: Narrowing of the origin of the IMA.  The IMA is patent. Inflow: Mild atherosclerotic calcification. No aneurysmal dilatation or dissection. The iliac arteries are patent. Proximal Outflow: The visualized proximal are close patent. Veins: The IVC is unremarkable. The SMV, splenic vein, and main portal vein are patent. No portal venous gas. Review of the MIP images confirms the above findings. NON-VASCULAR Lower chest: The visualized lung bases are clear. No intra-abdominal free air or free fluid. Hepatobiliary: The liver is unremarkable. No biliary dilatation. Cholecystectomy. Pancreas: Faint ill-defined hypoenhancing area in the uncinate process the pancreas, likely present on the prior CT but more conspicuous on today's exam. This may represent interspersed fat or a cluster of small IPMNs. This can be better evaluated with MRI on a nonemergent/outpatient basis. No  dilatation of the main pancreatic duct or gland atrophy. No active inflammatory changes. Spleen: Normal in size without focal abnormality. Adrenals/Urinary Tract: The adrenal glands are unremarkable. There is no hydronephrosis or nephrolithiasis on either side. The  visualized ureters and urinary bladder appear unremarkable. Stomach/Bowel: There is a small hiatal hernia. There is sigmoid diverticulosis with muscular hypertrophy. There is no bowel obstruction or active inflammation. No evidence of active GI bleed. The appendix is normal. Lymphatic: No adenopathy. Reproductive: The prostate and seminal vesicles are grossly unremarkable. Other: None Musculoskeletal: Osteopenia with degenerative changes of the spine. No acute osseous pathology. IMPRESSION: 1. No acute intra-abdominal or pelvic pathology. No evidence of active GI bleed. 2. Sigmoid diverticulosis. No bowel obstruction. Normal appendix. Electronically Signed   By: Elgie Collard M.D.   On: 01/31/2024 17:31   Assessment / Plan: *LGIB:  Red blood in stool that started this morning.  Has history of diverticular bleed in 01/2023 and one or two other episodes in the past as well.  This sounds similar.  Suspect the same.  Colonoscopy 01/2023 at [facility] during similar episode with blood throughout the colon but no active bleeding source seen, no blood in the TI.  Had syncopal episode yesterday and a lot of bleeding so CTA ordered, was negative for active bleeding/extravasation.  Hgb stable in low 7s last few checks.  Seems that bleeding is slowing. *IDA:  Ferritin low last week and iron studies all low previously.  -Monitor Hgb and transfuse prn. -Consider IV iron infusions as outpatient. -Ideally should consider both EGD and colonoscopy as outpatient when not acutely bleeding to get a good screening exam, evaluate further for his iron deficiency, etc.    LOS: 0 days   Princella Pellegrini. Melyssa Signor  02/01/2024, 8:51 AM

## 2024-02-02 DIAGNOSIS — D509 Iron deficiency anemia, unspecified: Secondary | ICD-10-CM | POA: Diagnosis not present

## 2024-02-02 DIAGNOSIS — K5731 Diverticulosis of large intestine without perforation or abscess with bleeding: Secondary | ICD-10-CM | POA: Diagnosis not present

## 2024-02-02 LAB — COMPREHENSIVE METABOLIC PANEL WITH GFR
ALT: 13 U/L (ref 0–44)
AST: 12 U/L — ABNORMAL LOW (ref 15–41)
Albumin: 3.1 g/dL — ABNORMAL LOW (ref 3.5–5.0)
Alkaline Phosphatase: 35 U/L — ABNORMAL LOW (ref 38–126)
Anion gap: 7 (ref 5–15)
BUN: 7 mg/dL — ABNORMAL LOW (ref 8–23)
CO2: 21 mmol/L — ABNORMAL LOW (ref 22–32)
Calcium: 8 mg/dL — ABNORMAL LOW (ref 8.9–10.3)
Chloride: 110 mmol/L (ref 98–111)
Creatinine, Ser: 0.78 mg/dL (ref 0.61–1.24)
GFR, Estimated: >60 mL/min (ref >60)
Glucose, Bld: 211 mg/dL — ABNORMAL HIGH (ref 70–99)
Potassium: 3.3 mmol/L — ABNORMAL LOW (ref 3.5–5.1)
Sodium: 138 mmol/L (ref 135–145)
Total Bilirubin: 0.3 mg/dL (ref 0.0–1.2)
Total Protein: 5.5 g/dL — ABNORMAL LOW (ref 6.5–8.1)

## 2024-02-02 LAB — BPAM RBC
Blood Product Expiration Date: 202504272359
ISSUE DATE / TIME: 202503261747
Unit Type and Rh: 5100

## 2024-02-02 LAB — TYPE AND SCREEN
ABO/RH(D): O POS
Antibody Screen: NEGATIVE
Unit division: 0

## 2024-02-02 LAB — GLUCOSE, CAPILLARY
Glucose-Capillary: 241 mg/dL — ABNORMAL HIGH (ref 70–99)
Glucose-Capillary: 181 mg/dL — ABNORMAL HIGH (ref 70–99)
Glucose-Capillary: 190 mg/dL — ABNORMAL HIGH (ref 70–99)
Glucose-Capillary: 201 mg/dL — ABNORMAL HIGH (ref 70–99)

## 2024-02-02 LAB — CBC
HCT: 24.4 % — ABNORMAL LOW (ref 39.0–52.0)
Hemoglobin: 7.6 g/dL — ABNORMAL LOW (ref 13.0–17.0)
MCH: 27.8 pg (ref 26.0–34.0)
MCHC: 31.1 g/dL (ref 30.0–36.0)
MCV: 89.4 fL (ref 80.0–100.0)
Platelets: 195 10*3/uL (ref 150–400)
RBC: 2.73 MIL/uL — ABNORMAL LOW (ref 4.22–5.81)
RDW: 14.6 % (ref 11.5–15.5)
WBC: 5.9 10*3/uL (ref 4.0–10.5)
nRBC: 0 % (ref 0.0–0.2)

## 2024-02-02 LAB — PHOSPHORUS: Phosphorus: 3.2 mg/dL (ref 2.5–4.6)

## 2024-02-02 LAB — HEMOGLOBIN AND HEMATOCRIT, BLOOD
HCT: 26.2 % — ABNORMAL LOW (ref 39.0–52.0)
Hemoglobin: 8.2 g/dL — ABNORMAL LOW (ref 13.0–17.0)

## 2024-02-02 LAB — MAGNESIUM: Magnesium: 1.7 mg/dL (ref 1.7–2.4)

## 2024-02-02 MED ORDER — POTASSIUM CHLORIDE CRYS ER 20 MEQ PO TBCR
40.0000 meq | EXTENDED_RELEASE_TABLET | Freq: Once | ORAL | Status: AC
  Administered 2024-02-02: 40 meq via ORAL
  Filled 2024-02-02: qty 2

## 2024-02-02 MED ORDER — INSULIN GLARGINE-YFGN 100 UNIT/ML ~~LOC~~ SOLN
15.0000 [IU] | Freq: Every day | SUBCUTANEOUS | Status: DC
  Administered 2024-02-03: 15 [IU] via SUBCUTANEOUS
  Filled 2024-02-02: qty 0.15

## 2024-02-02 MED ORDER — CHLORHEXIDINE GLUCONATE CLOTH 2 % EX PADS
6.0000 | MEDICATED_PAD | Freq: Every day | CUTANEOUS | Status: DC
  Administered 2024-02-02: 6 via TOPICAL

## 2024-02-02 NOTE — Inpatient Diabetes Management (Signed)
 Inpatient Diabetes Program Recommendations  AACE/ADA: New Consensus Statement on Inpatient Glycemic Control (2015)  Target Ranges:  Prepandial:   less than 140 mg/dL      Peak postprandial:   less than 180 mg/dL (1-2 hours)      Critically ill patients:  140 - 180 mg/dL   Lab Results  Component Value Date   GLUCAP 241 (H) 02/02/2024   HGBA1C 10.1 (H) 01/26/2024    Review of Glycemic Control  Latest Reference Range & Units 01/31/24 21:31 02/01/24 07:52 02/01/24 12:24 02/01/24 16:52 02/01/24 21:30 02/02/24 08:04  Glucose-Capillary 70 - 99 mg/dL 161 (H) 096 (H) 045 (H) 220 (H) 230 (H) 241 (H)  (H): Data is abnormally high Diabetes history: DM2 Outpatient Diabetes medications: glipizide 10 mg dailly, metformin 1000 mg BID Current orders for Inpatient glycemic control: Semglee 10 daily, Novolog 0-20 TID with meals and 0-5 HS   CBGs 225, 214, 220 HgbA1C - 10.1%   Inpatient Diabetes Program Recommendations:     Consider increasing Semglee to 18 units daily   Thanks, Lujean Rave, MSN, RNC-OB Diabetes Coordinator [phone] (8a-5p)

## 2024-02-02 NOTE — Progress Notes (Addendum)
 PROGRESS NOTE  Chad Cox MVH:[id number] DOB: 08-Jun-1951 DOA: 01/31/2024 PCP: Pearline Cables, MD   LOS: 1 day   Brief Narrative / Interim history: 73 year old male with DM2, HTN, HLD, comes into the hospital with bright red blood per rectum.  He has had 2 episodes at home, has been having diverticular bleeds in the past and this is identical to his prior presentations.  He denies any abdominal pain, no nausea or vomiting.  Shortly after admission onto the floor, had 2 more episodes of bleeding associated with hypotension, lightheadedness, and was transferred to stepdown on 3/25  Subjective / 24h Interval events: Feeling well, no lightheadedness or dizziness.  Night RN reports having a bowel movement this morning with old blood and blood clots but also some fresh appearing bright red blood  Assesement and Plan: Principal Problem:   Lower GI bleed Active Problems:   Hypertension   Dyslipidemia   Iron deficiency anemia   Class 1 obesity   Poorly controlled diabetes mellitus (HCC)   Lower GI bleeding   Diverticulosis of colon with hemorrhage  Principal problem Acute lower GI bleed -likely diverticular in nature.  Significant episode of bleeding 3/25 associated with hypotension, lightheadedness, requiring transfer to stepdown.  A stat CT angiogram however did not show any source of bleeding.  Clinically bleeding appears to be stopped now but has had intermittent bright red blood in his stools. -Continue to closely monitor, at risk for decompensation if he bleeds again  Active problems Acute blood loss anemia -continue to closely monitor hemoglobin.  Hemoglobin close to 7 3/26, transfuse unit of parietal blood cells.  Overall stable at 7.6 today  Essential hypertension-hold losartan due to intermittent bleed, blood pressure acceptable  Hyperlipidemia-continue statin  Obesity, class I-BMI 30.8  Hypokalemia-replace potassium and continue to monitor  DM2-poorly controlled, with  hyperglycemia.  Continue sliding scale  Lab Results  Component Value Date   HGBA1C 10.1 (H) 01/26/2024   CBG (last 3)  Recent Labs    02/01/24 1652 02/01/24 2130 02/02/24 0804  GLUCAP 220* 230* 241*    Scheduled Meds:  Chlorhexidine Gluconate Cloth  6 each Topical Q2200   insulin aspart  0-20 Units Subcutaneous TID WC   insulin aspart  0-5 Units Subcutaneous QHS   insulin glargine-yfgn  10 Units Subcutaneous Daily   pantoprazole  40 mg Oral BID   pravastatin  80 mg Oral Daily   Continuous Infusions:  sodium chloride     PRN Meds:.acetaminophen **OR** acetaminophen, ondansetron **OR** ondansetron (ZOFRAN) IV, mouth rinse  Current Outpatient Medications  Medication Instructions   acetaminophen (TYLENOL) 1,300 mg, Daily at bedtime   EXCEDRIN MIGRAINE 250-250-65 MG tablet 2 tablets, Oral, Every 6 hours PRN   glipiZIDE (GLUCOTROL XL) 10 mg, Oral, Daily with breakfast   Lidocaine HCl-Benzyl Alcohol (SALONPAS LIDOCAINE PLUS EX) 1-2 patches, Apply externally, Daily PRN   losartan (COZAAR) 50 mg, Oral, Daily   metFORMIN (GLUCOPHAGE) 1,000 mg, Oral, 2 times daily with meals   Multiple Vitamin (MULTIVITAMIN) tablet 1 tablet, Oral, Daily with breakfast   pantoprazole (PROTONIX) 40 MG tablet TAKE 1 TABLET(40 MG) BY MOUTH DAILY   pravastatin (PRAVACHOL) 80 mg, Oral, Daily    Diet Orders (From admission, onward)     Start     Ordered   01/31/24 1034  Diet full liquid Room service appropriate? Yes; Fluid consistency: Thin  Diet effective now       Question Answer Comment  Room service appropriate? Yes   Fluid consistency: Thin  01/31/24 1039            DVT prophylaxis: SCDs Start: 01/31/24 1038   Lab Results  Component Value Date   PLT 195 02/02/2024      Code Status: Full Code  Family Communication: no family at bedside   Status is: Observation The patient will require care spanning > 2 midnights and should be moved to inpatient because: GI bleed,  ABLA  Level of care: Stepdown  Consultants:  GI  Objective: Vitals:   02/02/24 0500 02/02/24 0600 02/02/24 0700 02/02/24 0800  BP: (!) 141/63 123/63 (!) 133/57 (!) 149/73  Pulse: 83 71 67 85  Resp: 16 16 14 14   Temp:   98.3 F (36.8 C)   TempSrc:   Oral   SpO2: 95% 94% 95% 94%  Weight:      Height:        Intake/Output Summary (Last 24 hours) at 02/02/2024 0930 Last data filed at 02/02/2024 0031 Gross per 24 hour  Intake 1988.47 ml  Output 1250 ml  Net 738.47 ml   Wt Readings from Last 3 Encounters:  01/31/24 103.1 kg  01/26/24 104.1 kg  08/16/23 103.7 kg    Examination:  Constitutional: NAD Eyes: lids and conjunctivae normal, no scleral icterus ENMT: mmm Neck: normal, supple Respiratory: clear to auscultation bilaterally, no wheezing, no crackles. Cardiovascular: Regular rate and rhythm, no murmurs / rubs / gallops. No LE edema. Abdomen: soft, no distention, no tenderness. Bowel sounds positive.    Data Reviewed: I have independently reviewed following labs and imaging studies   CBC Recent Labs  Lab 01/31/24 0939 01/31/24 1659 01/31/24 2225 02/01/24 0422 02/01/24 1449 02/01/24 2220 02/02/24 0313  WBC 5.9 7.0 7.5 8.2 6.7  --  5.9  HGB 9.8* 7.1* 7.4* 7.3* 7.1* 7.7* 7.6*  HCT 31.7* 22.3* 23.5* 23.2* 22.9* 25.1* 24.4*  PLT 269 204 221 228 203  --  195  MCV 88.1 87.8 87.0 87.9 88.4  --  89.4  MCH 27.2 28.0 27.4 27.7 27.4  --  27.8  MCHC 30.9 31.8 31.5 31.5 31.0  --  31.1  RDW 14.3 14.4 14.4 14.5 14.6  --  14.6  LYMPHSABS 1.0  --   --   --   --   --   --   MONOABS 0.5  --   --   --   --   --   --   EOSABS 0.3  --   --   --   --   --   --   BASOSABS 0.1  --   --   --   --   --   --     Recent Labs  Lab 01/26/24 1003 01/31/24 0939 02/01/24 0422 02/02/24 0313  NA 139 137 137 138  K 3.9 3.8 3.6 3.3*  CL 105 105 108 110  CO2 25 20* 22 21*  GLUCOSE 246* 353* 233* 211*  BUN 13 15 12  7*  CREATININE 0.93 0.98 0.84 0.78  CALCIUM 9.0 8.7* 8.1* 8.0*   AST 12 17  --  12*  ALT 10 13  --  13  ALKPHOS 49 46  --  35*  BILITOT 0.5 0.4  --  0.3  ALBUMIN 4.5 3.9  --  3.1*  MG  --   --   --  1.7  INR  --  1.0  --   --   HGBA1C 10.1*  --   --   --     ------------------------------------------------------------------------------------------------------------------  No results for input(s): "CHOL", "HDL", "LDLCALC", "TRIG", "CHOLHDL", "LDLDIRECT" in the last 72 hours.  Lab Results  Component Value Date   HGBA1C 10.1 (H) 01/26/2024   ------------------------------------------------------------------------------------------------------------------ No results for input(s): "TSH", "T4TOTAL", "T3FREE", "THYROIDAB" in the last 72 hours.  Invalid input(s): "FREET3"  Cardiac Enzymes No results for input(s): "CKMB", "TROPONINI", "MYOGLOBIN" in the last 168 hours.  Invalid input(s): "CK" ------------------------------------------------------------------------------------------------------------------ No results found for: "BNP"  CBG: Recent Labs  Lab 02/01/24 0752 02/01/24 1224 02/01/24 1652 02/01/24 2130 02/02/24 0804  GLUCAP 225* 214* 220* 230* 241*    Recent Results (from the past 240 hours)  MRSA Next Gen by PCR, Nasal     Status: None   Collection Time: 01/31/24 11:59 PM   Specimen: Nasal Mucosa; Nasal Swab  Result Value Ref Range Status   MRSA by PCR Next Gen NOT DETECTED NOT DETECTED Final    Comment: (NOTE) The GeneXpert MRSA Assay (FDA approved for NASAL specimens only), is one component of a comprehensive MRSA colonization surveillance program. It is not intended to diagnose MRSA infection nor to guide or monitor treatment for MRSA infections. Test performance is not FDA approved in patients less than 41 years old. Performed at [facility], [address], [place], [place] [address]      Radiology Studies: No results found.    Pamella Pert, MD, PhD [facility]  Between 7 am - 7 pm  I am available, please contact me via Amion (for emergencies) or Securechat (non urgent messages)  Between 7 pm - 7 am I am not available, please contact night coverage MD/APP via Amion

## 2024-02-02 NOTE — TOC Initial Note (Signed)
 Transition of Care [facility]) - Initial/Assessment Note    Patient Details  Name: Chad Cox MRN: [id number] Date of Birth: 14-Nov-1950  [facility] CM/SW Contact:    Diona Browner, LCSW Phone Number: 02/02/2024, 11:30 AM  Clinical Narrative:                 Pt from home alone. Pt continues medical workup. TOC following for d/c needs.     Barriers to Discharge: Continued Medical Work up   Patient Goals and CMS Choice Patient states their goals for this hospitalization and ongoing recovery are:: return home CMS Medicare.gov Compare Post Acute Care list provided to::  (NA) Choice offered to / list presented to : NA [place] ownership interest in [facility].provided to::  (NA)    Expected Discharge Plan and Services In-house Referral: NA     Living arrangements for the past 2 months: Single Family Home                 DME Arranged: N/A DME Agency: NA       HH Arranged: NA HH Agency: NA        Prior Living Arrangements/Services Living arrangements for the past 2 months: Single Family Home Lives with:: Self Patient language and need for interpreter reviewed:: Yes Do you feel safe going back to the place where you live?: Yes      Need for Family Participation in Patient Care: Yes (Comment) Care giver support system in place?: Yes (comment)   Criminal Activity/Legal Involvement Pertinent to Current Situation/Hospitalization: No - Comment as needed  Activities of Daily Living   ADL Screening (condition at time of admission) Independently performs ADLs?: Yes (appropriate for developmental age) Is the patient deaf or have difficulty hearing?: No Does the patient have difficulty seeing, even when wearing glasses/contacts?: Yes Does the patient have difficulty concentrating, remembering, or making decisions?: No  Permission Sought/Granted                  Emotional Assessment Appearance:: Appears stated age Attitude/Demeanor/Rapport:  Engaged Affect (typically observed): Accepting Orientation: : Oriented to Self, Oriented to Place, Oriented to  Time, Oriented to Situation   Psych Involvement: No (comment)  Admission diagnosis:  Lower GI bleed [K92.2] Occult blood in stools [R19.5] Lower GI bleeding [K92.2] Patient Active Problem List   Diagnosis Date Noted   Lower GI bleeding 02/01/2024   Diverticulosis of colon with hemorrhage 02/01/2024   Occult blood in stools 01/31/2024   Lower GI bleed 01/31/2024   Class 1 obesity 01/31/2024   Poorly controlled diabetes mellitus (HCC) 01/31/2024   Iron deficiency anemia 02/06/2020   Diverticulitis of colon with bleeding 02/06/2020   Kidney pain 01/11/2019   Hematuria 01/11/2019   Bile leak, postoperative 09/26/2018   Gallstones and inflammation of gallbladder without obstruction 09/20/2018   Cholelithiasis and cholecystitis without obstruction 09/20/2018   Obesity, unspecified 04/29/2014   Diabetes mellitus (HCC) 01/30/2012   Hypertension 01/30/2012   Dyslipidemia 01/30/2012   PCP:  Pearline Cables, MD Pharmacy:   [id number] [place], [place] - [address] AT [facility] [address] [place] [address] Phone: [phone] Fax: [phone]     Social Drivers of Health (SDOH) Social History: SDOH Screenings   Food Insecurity: No Food Insecurity (01/31/2024)  Housing: Low Risk  (01/31/2024)  Transportation Needs: No Transportation Needs (01/31/2024)  Utilities: Not At Risk (01/31/2024)  Depression (PHQ2-9): Low Risk  (  08/16/2023)  Financial Resource Strain: Low Risk  (03/09/2023)  Physical Activity: Unknown (03/09/2023)  Social Connections: Unknown (01/31/2024)  Stress: No Stress Concern Present (03/09/2023)  Tobacco Use: Low Risk  (01/31/2024)   SDOH Interventions:     Readmission Risk Interventions    02/02/2024   11:28 AM  Readmission Risk Prevention Plan  Post Dischage Appt Complete  Medication Screening  Complete  Transportation Screening Complete

## 2024-02-02 NOTE — Plan of Care (Signed)
   Problem: Coping: Goal: Ability to adjust to condition or change in health will improve Outcome: Progressing   Problem: Fluid Volume: Goal: Ability to maintain a balanced intake and output will improve Outcome: Progressing   Problem: Health Behavior/Discharge Planning: Goal: Ability to identify and utilize available resources and services will improve Outcome: Progressing

## 2024-02-02 NOTE — Progress Notes (Signed)
 [facility] Progress Note  CC:  LGIB   Subjective:  Had some bleeding this AM (although still much less than previous), some old looking blood but also some fresh appearing blood with clot per his nurse.  Still on full liquids.  Objective:  Vital signs in last 24 hours: Temp:  [98.3 F (36.8 C)-99.8 F (37.7 C)] 98.3 F (36.8 C) (03/27 0700) Pulse Rate:  [64-93] 85 (03/27 0800) Resp:  [11-28] 14 (03/27 0800) BP: (105-149)/(41-112) 149/73 (03/27 0800) SpO2:  [89 %-97 %] 94 % (03/27 0800) Last BM Date : 02/01/24 General:  Alert, Well-developed, in NAD Heart:  Regular rate and rhythm; no murmurs Pulm:  CTAB.  No W/R/R. Abdomen:  Soft, non-distended.  BS present.  Non-tender. Extremities:  Without edema. Neurologic:  Alert and oriented x 4;  grossly normal neurologically. Psych:  Alert and cooperative. Normal mood and affect.  Intake/Output from previous day: 03/26 0701 - 03/27 0700 In: 1993.7 [P.O.:360; I.V.:1251.2; Blood:382.5] Out: 1400 [Urine:1400]  Lab Results: Recent Labs    02/01/24 0422 02/01/24 1449 02/01/24 2220 02/02/24 0313  WBC 8.2 6.7  --  5.9  HGB 7.3* 7.1* 7.7* 7.6*  HCT 23.2* 22.9* 25.1* 24.4*  PLT 228 203  --  195   BMET Recent Labs    01/31/24 0939 02/01/24 0422 02/02/24 0313  NA 137 137 138  K 3.8 3.6 3.3*  CL 105 108 110  CO2 20* 22 21*  GLUCOSE 353* 233* 211*  BUN 15 12 7*  CREATININE 0.98 0.84 0.78  CALCIUM 8.7* 8.1* 8.0*   LFT Recent Labs    02/02/24 0313  PROT 5.5*  ALBUMIN 3.1*  AST 12*  ALT 13  ALKPHOS 35*  BILITOT 0.3   PT/INR Recent Labs    01/31/24 0939  LABPROT 13.8  INR 1.0    CT ANGIO GI BLEED Result Date: 01/31/2024 CLINICAL DATA:  Lower GI bleed. EXAM: CTA ABDOMEN AND PELVIS WITHOUT AND WITH CONTRAST TECHNIQUE: Multidetector CT imaging of the abdomen and pelvis was performed using the standard protocol during bolus administration of intravenous contrast. Multiplanar reconstructed images  and MIPs were obtained and reviewed to evaluate the vascular anatomy. RADIATION DOSE REDUCTION: This exam was performed according to the departmental dose-optimization program which includes automated exposure control, adjustment of the mA and/or kV according to patient size and/or use of iterative reconstruction technique. CONTRAST:  OMNIPAQUE IOHEXOL 350 MG/ML SOLN COMPARISON:  CT chest abdomen pelvis dated 08/30/2020. FINDINGS: VASCULAR Aorta: Mild atherosclerotic calcification. No aneurysmal dilatation or dissection. No periaortic fluid collection. Celiac: Patent without evidence of aneurysm, dissection, vasculitis or significant stenosis. SMA: Patent without evidence of aneurysm, dissection, vasculitis or significant stenosis. Renals: Atherosclerotic calcification of the origins of the renal arteries. The renal arteries remain patent. IMA: Narrowing of the origin of the IMA.  The IMA is patent. Inflow: Mild atherosclerotic calcification. No aneurysmal dilatation or dissection. The iliac arteries are patent. Proximal Outflow: The visualized proximal are close patent. Veins: The IVC is unremarkable. The SMV, splenic vein, and main portal vein are patent. No portal venous gas. Review of the MIP images confirms the above findings. NON-VASCULAR Lower chest: The visualized lung bases are clear. No intra-abdominal free air or free fluid. Hepatobiliary: The liver is unremarkable. No biliary dilatation. Cholecystectomy. Pancreas: Faint ill-defined hypoenhancing area in the uncinate process the pancreas, likely present on the prior CT but more conspicuous on today's exam. This may represent interspersed fat or a cluster of small IPMNs.  This can be better evaluated with MRI on a nonemergent/outpatient basis. No dilatation of the main pancreatic duct or gland atrophy. No active inflammatory changes. Spleen: Normal in size without focal abnormality. Adrenals/Urinary Tract: The adrenal glands are unremarkable. There is  no hydronephrosis or nephrolithiasis on either side. The visualized ureters and urinary bladder appear unremarkable. Stomach/Bowel: There is a small hiatal hernia. There is sigmoid diverticulosis with muscular hypertrophy. There is no bowel obstruction or active inflammation. No evidence of active GI bleed. The appendix is normal. Lymphatic: No adenopathy. Reproductive: The prostate and seminal vesicles are grossly unremarkable. Other: None Musculoskeletal: Osteopenia with degenerative changes of the spine. No acute osseous pathology. IMPRESSION: 1. No acute intra-abdominal or pelvic pathology. No evidence of active GI bleed. 2. Sigmoid diverticulosis. No bowel obstruction. Normal appendix. Electronically Signed   By: Elgie Collard M.D.   On: 01/31/2024 17:31   Assessment / Plan: *LGIB:  Red blood in stool that started this morning.  Has history of diverticular bleed in 01/2023 and one or two other episodes in the past as well.  This sounds similar.  Suspect the same.  Colonoscopy 01/2023 at [facility] during similar episode with blood throughout the colon but no active bleeding source seen, no blood in the TI.  Had syncopal episode 3/25 and a lot of bleeding so CTA ordered, was negative for active bleeding/extravasation.  Hgb stable at 7.6 grams.  Seems that bleeding is slowing, but still present. *IDA:  Ferritin low last week and iron studies all low previously.   -Monitor Hgb and transfuse prn. -Consider IV iron infusions as outpatient. -Ideally should consider both EGD and colonoscopy as outpatient when not acutely bleeding to get a good screening exam, evaluate further for his iron deficiency, etc.  With ongoing bleeding ? If we should give at least partial bowel prep to see if it clears and to better delineate ongoing bleeding?  Will discuss with Dr. Tomasa Rand.   LOS: 1 day   Princella Pellegrini. Kaliel Bolds  02/02/2024, 9:14 AM

## 2024-02-03 DIAGNOSIS — K5731 Diverticulosis of large intestine without perforation or abscess with bleeding: Secondary | ICD-10-CM | POA: Diagnosis not present

## 2024-02-03 DIAGNOSIS — D509 Iron deficiency anemia, unspecified: Secondary | ICD-10-CM | POA: Diagnosis not present

## 2024-02-03 DIAGNOSIS — K922 Gastrointestinal hemorrhage, unspecified: Secondary | ICD-10-CM | POA: Diagnosis not present

## 2024-02-03 LAB — BASIC METABOLIC PANEL WITH GFR
Anion gap: 9 (ref 5–15)
BUN: 7 mg/dL — ABNORMAL LOW (ref 8–23)
CO2: 21 mmol/L — ABNORMAL LOW (ref 22–32)
Calcium: 8.2 mg/dL — ABNORMAL LOW (ref 8.9–10.3)
Chloride: 107 mmol/L (ref 98–111)
Creatinine, Ser: 0.82 mg/dL (ref 0.61–1.24)
GFR, Estimated: >60 mL/min (ref >60)
Glucose, Bld: 237 mg/dL — ABNORMAL HIGH (ref 70–99)
Potassium: 3.3 mmol/L — ABNORMAL LOW (ref 3.5–5.1)
Sodium: 137 mmol/L (ref 135–145)

## 2024-02-03 LAB — CBC
HCT: 28.2 % — ABNORMAL LOW (ref 39.0–52.0)
Hemoglobin: 8.6 g/dL — ABNORMAL LOW (ref 13.0–17.0)
MCH: 28 pg (ref 26.0–34.0)
MCHC: 30.5 g/dL (ref 30.0–36.0)
MCV: 91.9 fL (ref 80.0–100.0)
Platelets: 204 10*3/uL (ref 150–400)
RBC: 3.07 MIL/uL — ABNORMAL LOW (ref 4.22–5.81)
RDW: 14.7 % (ref 11.5–15.5)
WBC: 6.5 10*3/uL (ref 4.0–10.5)
nRBC: 0 % (ref 0.0–0.2)

## 2024-02-03 LAB — GLUCOSE, CAPILLARY
Glucose-Capillary: 231 mg/dL — ABNORMAL HIGH (ref 70–99)
Glucose-Capillary: 217 mg/dL — ABNORMAL HIGH (ref 70–99)

## 2024-02-03 MED ORDER — POTASSIUM CHLORIDE CRYS ER 20 MEQ PO TBCR
40.0000 meq | EXTENDED_RELEASE_TABLET | Freq: Once | ORAL | Status: AC
  Administered 2024-02-03: 40 meq via ORAL
  Filled 2024-02-03: qty 2

## 2024-02-03 MED ORDER — BLOOD GLUCOSE MONITORING SUPPL DEVI: 1.0000 | Freq: Three times a day (TID) | 0 refills | Status: DC

## 2024-02-03 MED ORDER — BLOOD GLUCOSE TEST VI STRP: 1.0000 | ORAL_STRIP | Freq: Three times a day (TID) | 0 refills | Status: AC

## 2024-02-03 MED ORDER — LANCET DEVICE MISC: 1.0000 | Freq: Three times a day (TID) | 0 refills | Status: AC

## 2024-02-03 MED ORDER — LANCETS MISC. MISC: 1.0000 | Freq: Three times a day (TID) | 0 refills | Status: AC

## 2024-02-03 NOTE — Discharge Summary (Signed)
 Physician Discharge Summary  Keyan Cox OEH:[id number] DOB: 1951-01-31 DOA: 01/31/2024  PCP: Pearline Cables, MD  Admit date: 01/31/2024 Discharge date: 02/03/2024  Admitted From: home Disposition:  home  Recommendations for Outpatient Follow-up:  Follow up with PCP in 1-2 weeks Follow-up with gastroenterology as an outpatient for elective colonoscopy  Home Health: none Equipment/Devices: none  Discharge Condition: stable CODE STATUS: Full code  HPI: Per admitting MD, Chad Cox is a 73 y.o. male with medical history significant of poorly controlled type 2 diabetes mellitus, hypertension, dyslipidemia, class I obesity and history of iron deficiency anemia; who presented to the hospital secondary to noticing blood in his stools.  Patient reports 2 episodes of bloody stools since condition started; on his description the whole presentation similar to prior episodes of diverticular bleed.  Patient is hemodynamically stable and reports no chest pain, no shortness of breath, no nausea, no vomiting, no abdominal pain, no dysuria/hematuria, no dizziness/lightheadedness or any other complaints. [facility] has been consulted to place patient in the hospital for further evaluation and management; GI service ([facility]) has been contacted.  Hospital Course / Discharge diagnoses: Principal Problem:   Lower GI bleed Active Problems:   Hypertension   Dyslipidemia   Iron deficiency anemia   Class 1 obesity   Poorly controlled diabetes mellitus (HCC)   Lower GI bleeding   Diverticulosis of colon with hemorrhage   Principal problem Acute lower GI bleed -likely diverticular in nature.  Gastroenterology consulted and followed patient while hospitalized.  Patient had a second episode of significant GI bleed following admission associated with hypotension, lightheadedness, and was transferred to stepdown.  He underwent a stat CT angiogram at that time however did not show any source for his  bleed.  Following that, clinically his bleeding appears to have stopped and hemoglobin has remained stable.  He was transfused a total of 1 unit of packed red blood cells.  With improvement, no further bleed, he will be discharged home in stable condition.  He will follow-up with gastroenterology as an outpatient and will have elective colonoscopy to evaluate further  Active problems Acute blood loss anemia -he was transfused 1 unit of packed red blood cells.  Hemoglobin stable  Essential hypertension-resume home medications on discharge Hyperlipidemia-continue statin Obesity, class I-BMI 30.8 Hypokalemia-replaced DM2-poorly controlled, with hyperglycemia.  Resume home medications  Sepsis ruled out   Discharge Instructions   Allergies as of 02/03/2024       Reactions   Adhesive [tape] Itching, Other (See Comments)   "Acts like poison ivy" blisters up, itching   Betadine [povidone Iodine] Itching, Other (See Comments)   Blisters, itching        Medication List     TAKE these medications    acetaminophen 650 MG CR tablet Commonly known as: TYLENOL Take 1,300 mg by mouth at bedtime.   Excedrin Migraine 250-250-65 MG tablet Generic drug: aspirin-acetaminophen-caffeine Take 2 tablets by mouth every 6 (six) hours as needed for headache or migraine.   glipiZIDE 10 MG 24 hr tablet Commonly known as: GLUCOTROL XL Take 1 tablet (10 mg total) by mouth daily with breakfast.   losartan 50 MG tablet Commonly known as: COZAAR Take 1 tablet (50 mg total) by mouth daily.   metFORMIN 1000 MG tablet Commonly known as: GLUCOPHAGE Take 1 tablet (1,000 mg total) by mouth 2 (two) times daily with a meal. What changed: when to take this   multivitamin tablet Take 1 tablet by mouth daily with breakfast.  pantoprazole 40 MG tablet Commonly known as: PROTONIX TAKE 1 TABLET(40 MG) BY MOUTH DAILY What changed:  how much to take how to take this when to take this additional  instructions   pravastatin 80 MG tablet Commonly known as: PRAVACHOL Take 1 tablet (80 mg total) by mouth daily.   SALONPAS LIDOCAINE PLUS EX Apply 1-2 patches topically daily as needed (for pain).       Consultations: GI  Procedures/Studies:  CT ANGIO GI BLEED Result Date: 01/31/2024 CLINICAL DATA:  Lower GI bleed. EXAM: CTA ABDOMEN AND PELVIS WITHOUT AND WITH CONTRAST TECHNIQUE: Multidetector CT imaging of the abdomen and pelvis was performed using the standard protocol during bolus administration of intravenous contrast. Multiplanar reconstructed images and MIPs were obtained and reviewed to evaluate the vascular anatomy. RADIATION DOSE REDUCTION: This exam was performed according to the departmental dose-optimization program which includes automated exposure control, adjustment of the mA and/or kV according to patient size and/or use of iterative reconstruction technique. CONTRAST:  OMNIPAQUE IOHEXOL 350 MG/ML SOLN COMPARISON:  CT chest abdomen pelvis dated 08/30/2020. FINDINGS: VASCULAR Aorta: Mild atherosclerotic calcification. No aneurysmal dilatation or dissection. No periaortic fluid collection. Celiac: Patent without evidence of aneurysm, dissection, vasculitis or significant stenosis. SMA: Patent without evidence of aneurysm, dissection, vasculitis or significant stenosis. Renals: Atherosclerotic calcification of the origins of the renal arteries. The renal arteries remain patent. IMA: Narrowing of the origin of the IMA.  The IMA is patent. Inflow: Mild atherosclerotic calcification. No aneurysmal dilatation or dissection. The iliac arteries are patent. Proximal Outflow: The visualized proximal are close patent. Veins: The IVC is unremarkable. The SMV, splenic vein, and main portal vein are patent. No portal venous gas. Review of the MIP images confirms the above findings. NON-VASCULAR Lower chest: The visualized lung bases are clear. No intra-abdominal free air or free fluid.  Hepatobiliary: The liver is unremarkable. No biliary dilatation. Cholecystectomy. Pancreas: Faint ill-defined hypoenhancing area in the uncinate process the pancreas, likely present on the prior CT but more conspicuous on today's exam. This may represent interspersed fat or a cluster of small IPMNs. This can be better evaluated with MRI on a nonemergent/outpatient basis. No dilatation of the main pancreatic duct or gland atrophy. No active inflammatory changes. Spleen: Normal in size without focal abnormality. Adrenals/Urinary Tract: The adrenal glands are unremarkable. There is no hydronephrosis or nephrolithiasis on either side. The visualized ureters and urinary bladder appear unremarkable. Stomach/Bowel: There is a small hiatal hernia. There is sigmoid diverticulosis with muscular hypertrophy. There is no bowel obstruction or active inflammation. No evidence of active GI bleed. The appendix is normal. Lymphatic: No adenopathy. Reproductive: The prostate and seminal vesicles are grossly unremarkable. Other: None Musculoskeletal: Osteopenia with degenerative changes of the spine. No acute osseous pathology. IMPRESSION: 1. No acute intra-abdominal or pelvic pathology. No evidence of active GI bleed. 2. Sigmoid diverticulosis. No bowel obstruction. Normal appendix. Electronically Signed   By: Elgie Collard M.D.   On: 01/31/2024 17:31     Subjective: - no chest pain, shortness of breath, no abdominal pain, nausea or vomiting.   Discharge Exam: BP (!) 146/73   Pulse 78   Temp 98.2 F (36.8 C) (Oral)   Resp 16   Ht 6' (1.829 m)   Wt 103.1 kg   SpO2 96%   BMI 30.83 kg/m   General: Pt is alert, awake, not in acute distress Cardiovascular: RRR, S1/S2 +, no rubs, no gallops Respiratory: CTA bilaterally, no wheezing, no rhonchi Abdominal: Soft, NT,  ND, bowel sounds + Extremities: no edema, no cyanosis    The results of significant diagnostics from this hospitalization (including imaging,  microbiology, ancillary and laboratory) are listed below for reference.     Microbiology: Recent Results (from the past 240 hours)  MRSA Next Gen by PCR, Nasal     Status: None   Collection Time: 01/31/24 11:59 PM   Specimen: Nasal Mucosa; Nasal Swab  Result Value Ref Range Status   MRSA by PCR Next Gen NOT DETECTED NOT DETECTED Final    Comment: (NOTE) The GeneXpert MRSA Assay (FDA approved for NASAL specimens only), is one component of a comprehensive MRSA colonization surveillance program. It is not intended to diagnose MRSA infection nor to guide or monitor treatment for MRSA infections. Test performance is not FDA approved in patients less than 59 years old. Performed at [facility], [address], [place], [place] [address]      Labs: Basic Metabolic Panel: Recent Labs  Lab 01/31/24 0939 02/01/24 0422 02/02/24 0313 02/03/24 0308  NA 137 137 138 137  K 3.8 3.6 3.3* 3.3*  CL 105 108 110 107  CO2 20* 22 21* 21*  GLUCOSE 353* 233* 211* 237*  BUN 15 12 7* 7*  CREATININE 0.98 0.84 0.78 0.82  CALCIUM 8.7* 8.1* 8.0* 8.2*  MG  --   --  1.7  --   PHOS  --   --  3.2  --    Liver Function Tests: Recent Labs  Lab 01/31/24 0939 02/02/24 0313  AST 17 12*  ALT 13 13  ALKPHOS 46 35*  BILITOT 0.4 0.3  PROT 7.0 5.5*  ALBUMIN 3.9 3.1*   CBC: Recent Labs  Lab 01/31/24 0939 01/31/24 1659 01/31/24 2225 02/01/24 0422 02/01/24 1449 02/01/24 2220 02/02/24 0313 02/02/24 1439 02/03/24 0308  WBC 5.9   < > 7.5 8.2 6.7  --  5.9  --  6.5  NEUTROABS 4.1  --   --   --   --   --   --   --   --   HGB 9.8*   < > 7.4* 7.3* 7.1* 7.7* 7.6* 8.2* 8.6*  HCT 31.7*   < > 23.5* 23.2* 22.9* 25.1* 24.4* 26.2* 28.2*  MCV 88.1   < > 87.0 87.9 88.4  --  89.4  --  91.9  PLT 269   < > 221 228 203  --  195  --  204   < > = values in this interval not displayed.   CBG: Recent Labs  Lab 02/02/24 0804 02/02/24 1150 02/02/24 1653 02/02/24 2148 02/03/24 0731  GLUCAP  241* 181* 190* 201* 231*   Hgb A1c No results for input(s): "HGBA1C" in the last 72 hours. Lipid Profile No results for input(s): "CHOL", "HDL", "LDLCALC", "TRIG", "CHOLHDL", "LDLDIRECT" in the last 72 hours. Thyroid function studies No results for input(s): "TSH", "T4TOTAL", "T3FREE", "THYROIDAB" in the last 72 hours.  Invalid input(s): "FREET3" Urinalysis    Component Value Date/Time   COLORURINE YELLOW (A) 08/30/2020 1529   APPEARANCEUR CLEAR (A) 08/30/2020 1529   LABSPEC 1.031 (H) 08/30/2020 1529   PHURINE 5.0 08/30/2020 1529   GLUCOSEU >=500 (A) 08/30/2020 1529   GLUCOSEU NEGATIVE 01/11/2019 1427   HGBUR NEGATIVE 08/30/2020 1529   BILIRUBINUR NEGATIVE 08/30/2020 1529   BILIRUBINUR Negative 01/11/2019 1357   KETONESUR 5 (A) 08/30/2020 1529   PROTEINUR NEGATIVE 08/30/2020 1529   UROBILINOGEN 0.2 01/11/2019 1427   UROBILINOGEN 0.2 01/11/2019 1357  NITRITE NEGATIVE 08/30/2020 1529   LEUKOCYTESUR NEGATIVE 08/30/2020 1529    FURTHER DISCHARGE INSTRUCTIONS:   Get Medicines reviewed and adjusted: Please take all your medications with you for your next visit with your Primary MD   Laboratory/radiological data: Please request your Primary MD to go over all hospital tests and procedure/radiological results at the follow up, please ask your Primary MD to get all Hospital records sent to his/her office.   In some cases, they will be blood work, cultures and biopsy results pending at the time of your discharge. Please request that your primary care M.D. goes through all the records of your hospital data and follows up on these results.   Also Note the following: If you experience worsening of your admission symptoms, develop shortness of breath, life threatening emergency, suicidal or homicidal thoughts you must seek medical attention immediately by calling 911 or calling your MD immediately  if symptoms less severe.   You must read complete instructions/literature along with all  the possible adverse reactions/side effects for all the Medicines you take and that have been prescribed to you. Take any new Medicines after you have completely understood and accpet all the possible adverse reactions/side effects.    Do not drive when taking Pain medications or sleeping medications (Benzodaizepines)   Do not take more than prescribed Pain, Sleep and Anxiety Medications. It is not advisable to combine anxiety,sleep and pain medications without talking with your primary care practitioner   Special Instructions: If you have smoked or chewed Tobacco  in the last 2 yrs please stop smoking, stop any regular Alcohol  and or any Recreational drug use.   Wear Seat belts while driving.   Please note: You were cared for by a hospitalist during your hospital stay. Once you are discharged, your primary care physician will handle any further medical issues. Please note that NO REFILLS for any discharge medications will be authorized once you are discharged, as it is imperative that you return to your primary care physician (or establish a relationship with a primary care physician if you do not have one) for your post hospital discharge needs so that they can reassess your need for medications and monitor your lab values.  Time coordinating discharge: 35 minutes  SIGNED:  Pamella Pert, MD, PhD 02/03/2024, 11:57 AM

## 2024-02-03 NOTE — Plan of Care (Signed)

## 2024-02-03 NOTE — TOC Transition Note (Signed)
 Transition of Care [facility]) - Discharge Note   Patient Details  Name: Chad Cox MRN: [id number] Date of Birth: [birth year]  [facility] of [facility] CM/SW Contact:  Lanier Clam, RN Phone Number: 02/03/2024, 1:48 PM   Clinical Narrative:  d/c home no needs.     Final next level of care: Home/Self Care Barriers to Discharge: No Barriers Identified   Patient Goals and CMS Choice Patient states their goals for this hospitalization and ongoing recovery are:: return home CMS Medicare.gov Compare Post Acute Care list provided to::  (NA) Choice offered to / list presented to : NA [place] ownership interest in [facility]provided to::  (NA)    Discharge Placement                       Discharge Plan and Services Additional resources added to the After Visit Summary for   In-house Referral: NA              DME Arranged: N/A DME Agency: NA       HH Arranged: NA HH Agency: NA        Social Drivers of Health (SDOH) Interventions SDOH Screenings   Food Insecurity: No Food Insecurity (01/31/2024)  Housing: Low Risk  (01/31/2024)  Transportation Needs: No Transportation Needs (01/31/2024)  Utilities: Not At Risk (01/31/2024)  Depression (PHQ2-9): Low Risk  (08/16/2023)  Financial Resource Strain: Low Risk  (03/09/2023)  Physical Activity: Unknown (03/09/2023)  Social Connections: Unknown (01/31/2024)  Stress: No Stress Concern Present (03/09/2023)  Tobacco Use: Low Risk  (01/31/2024)     Readmission Risk Interventions    02/02/2024   11:28 AM  Readmission Risk Prevention Plan  Post Dischage Appt Complete  Medication Screening Complete  Transportation Screening Complete

## 2024-02-03 NOTE — Progress Notes (Signed)
     [facility] Progress Note  CC:   LGIB   Subjective:  Feels well.  No further sign of bleeding since 3/27 AM.  No new or other complaints.  Objective:  Vital signs in last 24 hours: Temp:  [98.3 F (36.8 C)-99.8 F (37.7 C)] 98.4 F (36.9 C) (03/28 0400) Pulse Rate:  [61-89] 73 (03/28 0600) Resp:  [11-22] 18 (03/28 0600) BP: (125-163)/(51-106) 151/51 (03/28 0600) SpO2:  [93 %-98 %] 97 % (03/28 0600) Last BM Date : 02/02/24 General:  Alert, Well-developed, in NAD Heart:  Regular rate and rhythm; no murmurs Pulm:  CTAB.  No W/R/R. Abdomen:  Soft, non-distended.  BS present.  Non-tender. Extremities:  Without edema. Neurologic:  Alert and oriented x 4;  grossly normal neurologically. Psych:  Alert and cooperative. Normal mood and affect.  Intake/Output from previous day: 03/27 0701 - 03/28 0700 In: 603.8 [I.V.:603.8] Out: 1625 [Urine:1625]  Lab Results: Recent Labs    02/01/24 1449 02/01/24 2220 02/02/24 0313 02/02/24 1439 02/03/24 0308  WBC 6.7  --  5.9  --  6.5  HGB 7.1*   < > 7.6* 8.2* 8.6*  HCT 22.9*   < > 24.4* 26.2* 28.2*  PLT 203  --  195  --  204   < > = values in this interval not displayed.   BMET Recent Labs    02/01/24 0422 02/02/24 0313 02/03/24 0308  NA 137 138 137  K 3.6 3.3* 3.3*  CL 108 110 107  CO2 22 21* 21*  GLUCOSE 233* 211* 237*  BUN 12 7* 7*  CREATININE 0.84 0.78 0.82  CALCIUM 8.1* 8.0* 8.2*   LFT Recent Labs    02/02/24 0313  PROT 5.5*  ALBUMIN 3.1*  AST 12*  ALT 13  ALKPHOS 35*  BILITOT 0.3   PT/INR Recent Labs    01/31/24 0939  LABPROT 13.8  INR 1.0   Assessment / Plan: *LGIB:  Red blood in stool that started this morning.  Has history of diverticular bleed in 01/2023 and one or two other episodes in the past as well.  This sounds similar.  Suspect the same.  Colonoscopy 01/2023 at [facility] during similar episode with blood throughout the colon but no active bleeding source seen, no blood in the TI.   Had syncopal episode 3/25 and a lot of bleeding so CTA ordered, was negative for active bleeding/extravasation.  Hgb stable/imcreasing at 8.6 grams.  No further sign of bleeding since the smallish amount yesterday morning. *IDA:  Ferritin low last week and iron studies all low previously.   -Monitor Hgb and transfuse prn. -Consider IV iron infusions as outpatient. -Ideally should consider both EGD and colonoscopy as outpatient when not acutely bleeding to get a good screening exam, evaluate further for his iron deficiency, etc.   -Ok for discharge today from a GI standpoint.  Will get him a follow-up appt.    LOS: 2 days   Princella Pellegrini. Cleatus Goodin  02/03/2024, 9:15 AM

## 2024-02-03 NOTE — Inpatient Diabetes Management (Signed)
 Inpatient Diabetes Program Recommendations  AACE/ADA: New Consensus Statement on Inpatient Glycemic Control (2015)  Target Ranges:  Prepandial:   less than 140 mg/dL      Peak postprandial:   less than 180 mg/dL (1-2 hours)      Critically ill patients:  140 - 180 mg/dL   Lab Results  Component Value Date   GLUCAP 217 (H) 02/03/2024   HGBA1C 10.1 (H) 01/26/2024    Review of Glycemic Control  Latest Reference Range & Units 02/02/24 08:04 02/02/24 11:50 02/02/24 16:53 02/02/24 21:48 02/03/24 07:31 02/03/24 12:16  Glucose-Capillary 70 - 99 mg/dL 829 (H) 562 (H) 130 (H) 201 (H) 231 (H) 217 (H)  (H): Data is abnormally high Diabetes history: Type 2 DM Outpatient Diabetes medications: Metformin 1000 mg BID, Glipizide 10 mg QD Current orders for Inpatient glycemic control: Novolog 0-20 units TID & HS, Semglee 15 units every day, Novolog 0-5 units QHS  Inpatient Diabetes Program Recommendations:    Spoke with patient briefly as he was getting ready to leave for discharge. Verified home medications.  Reviewed patient's current A1c of 10.1%. Explained what a A1c is and what it measures. Also reviewed goal A1c with patient, importance of good glucose control @ home, and blood sugar goals. Reviewed patho of DM, need for improvement, survival skills, interventions, vascular changes and other commorbidities.  Patient needs a meter at discharge. Secure chat sent to MD.  Stressed the importance of post hospital follow up with PCP as patient was guarded. Discussed CGM and the potential for insulin given A1C result.  Admits to drinking sugary beverages. Reviewed alternatives and the need for increasing protein intake as able.  No additional time for review.   Thanks, Lujean Rave, MSN, RNC-OB Diabetes Coordinator [phone] (8a-5p)

## 2024-02-03 NOTE — Progress Notes (Signed)
   02/03/24 1319  AVS Discharge Documentation  AVS Discharge Instructions Including Medications Provided to patient/caregiver  Name of Person Receiving AVS Discharge Instructions Including Medications Gabriel Rainwater  Name of Clinician That Reviewed AVS Discharge Instructions Including Medications Gayland Curry RN

## 2024-02-06 ENCOUNTER — Telehealth: Payer: Self-pay | Admitting: *Deleted

## 2024-02-06 NOTE — Transitions of Care (Post Inpatient/ED Visit) (Signed)
 02/06/2024  Name: Chad Cox MRN: [id number] DOB: [birth year]  Today's TOC FU Call Status: Today's TOC FU Call Status:: Successful TOC FU Call Completed TOC FU Call Complete Date: 02/06/24 Patient's Name and Date of Birth confirmed.  Transition Care Management Follow-up Telephone Call Date of Discharge: 02/03/24 Discharge Facility: [facility]) Type of Discharge: Inpatient Admission Primary Inpatient Discharge Diagnosis:: Lower GI bleeding How have you been since you were released from the hospital?: Better ("I am fine; back at work; driving myself; feeling okay. not having any problems") Any questions or concerns?: No  Items Reviewed: Did you receive and understand the discharge instructions provided?: Yes (thoroughly reviewed with patient who verbalizes good understanding of same) Medications obtained,verified, and reconciled?: No Medications Not Reviewed Reasons:: Other: (Patient declined all aspects of medication review: he is at work; he confirms no changes made to medications and self-manages medications; denies questions/ concerns around medications today) Any new allergies since your discharge?: No Dietary orders reviewed?: Yes Type of Diet Ordered:: "Healthy as possible" Do you have support at home?: Yes People in Home: alone Name of Support/Comfort Primary Source: Reports resides alone; independent in self-care activities; supportive friends and family assists as/ if needed/ indicated  Medications Reviewed Today: Medications Reviewed Today     Reviewed by Michaela Corner, RN (Registered Nurse) on 02/06/24 at 1120  Med List Status: <None>   Medication Order Taking? Sig Documenting Provider Last Dose Status Informant  acetaminophen (TYLENOL) 650 MG CR tablet [id number] No Take 1,300 mg by mouth at bedtime. [provider] 01/30/2024 Bedtime Active Self           Med Note Marilu Favre Feb 06, 2024 11:20 AM) 02/06/24: Declines all aspects of  medication review during TOC call today  Blood Glucose Monitoring Suppl DEVI [id number]  1 each by Does not apply route in the morning, at noon, and at bedtime. May substitute to any manufacturer covered by patient's insurance. Chad Gilding, MD  Active   EXCEDRIN MIGRAINE 208 204 7637 MG tablet [id number] No Take 2 tablets by mouth every 6 (six) hours as needed for headache or migraine. [provider] Past Month Active Self  glipiZIDE (GLUCOTROL XL) 10 MG 24 hr tablet [id number] No Take 1 tablet (10 mg total) by mouth daily with breakfast. Chad, Gwenlyn Found, MD 01/31/2024 Morning Active Self           Med Note Chad Cox, [name]   Tue Jan 31, 2024  3:06 PM) The patient said he thinks he took this today already, but if not, it definitely was yesterday.  Glucose Blood (BLOOD GLUCOSE TEST STRIPS) STRP [id number]  1 each by In Vitro route in the morning, at noon, and at bedtime. May substitute to any manufacturer covered by patient's insurance. Chad Gilding, MD  Active   Lancet Device MISC [id number]  1 each by Does not apply route in the morning, at noon, and at bedtime. May substitute to any manufacturer covered by patient's insurance. Chad Gilding, MD  Active   Lancets Misc. MISC [id number]  1 each by Does not apply route in the morning, at noon, and at bedtime. May substitute to any manufacturer covered by patient's insurance. Chad Gilding, MD  Active   Lidocaine HCl-Benzyl Alcohol (SALONPAS LIDOCAINE PLUS EX) [id number] No Apply 1-2 patches topically daily as needed (for pain). [provider] 01/30/2024 Active Self  losartan (COZAAR) 50 MG tablet [id number] No Take 1 tablet (50 mg total) by  mouth daily. Chad Cables, MD 01/31/2024 Morning Active Self           Med Note Chad Cox, [name]   Tue Jan 31, 2024  3:06 PM) The patient said he thinks he took this today already, but if not, it definitely was yesterday.  metFORMIN (GLUCOPHAGE) 1000 MG tablet [id number] No Take 1 tablet  (1,000 mg total) by mouth 2 (two) times daily with a meal.  Patient taking differently: Take 1,000 mg by mouth in the morning and at bedtime.   Chad Cables, MD 01/31/2024 Morning Active Self           Med Note Chad Cox, [name]   Tue Jan 31, 2024  3:06 PM) The patient said he thinks he took this today already, but if not, it definitely was yesterday.  Multiple Vitamin (MULTIVITAMIN) tablet [id number] No Take 1 tablet by mouth daily with breakfast. [provider] 01/31/2024 Active Self  pantoprazole (PROTONIX) 40 MG tablet [id number] No TAKE 1 TABLET(40 MG) BY MOUTH DAILY  Patient taking differently: Take 40 mg by mouth at bedtime.   Chad, Gwenlyn Found, MD 01/30/2024 Bedtime Active Self  pravastatin (PRAVACHOL) 80 MG tablet [id number] No Take 1 tablet (80 mg total) by mouth daily. Chad, Gwenlyn Found, MD 01/31/2024 Active Self           Med Note Chad Cox, [name]   Tue Jan 31, 2024  3:07 PM)             Home Care and Equipment/Supplies: Were Home Health Services Ordered?: No Any new equipment or medical supplies ordered?: No  Functional Questionnaire: Do you need assistance with bathing/showering or dressing?: No Do you need assistance with meal preparation?: No Do you need assistance with eating?: No Do you have difficulty maintaining continence: No Do you need assistance with getting out of bed/getting out of a chair/moving?: No Do you have difficulty managing or taking your medications?: No  Follow up appointments reviewed: PCP Follow-up appointment confirmed?: Yes (care coordination outreach in real-time with scheduling care guide to successfully schedule hospital follow up PCP appointment) Date of PCP follow-up appointment?: 02/15/24 (verified this is recommended time frame for follow up per hospital discharging provider notes) Follow-up Provider: PCP- [facility] Follow-up appointment confirmed?: NA (verified not indicated per hospital discharging  provider discharge notes) Do you need transportation to your follow-up appointment?: No Do you understand care options if your condition(s) worsen?: Yes-patient verbalized understanding  SDOH Interventions Today    Flowsheet Row Most Recent Value  SDOH Interventions   Food Insecurity Interventions Intervention Not Indicated  Housing Interventions Intervention Not Indicated  Transportation Interventions Intervention Not Indicated  [drives self]  Utilities Interventions Intervention Not Indicated      Interventions Today    Flowsheet Row Most Recent Value  Chronic Disease   Chronic disease during today's visit Other  [Lower GI bleeding- diverticulosis]  General Interventions   General Interventions Discussed/Reviewed General Interventions Discussed, Doctor Visits  Doctor Visits Discussed/Reviewed PCP  PCP/Specialist Visits Compliance with follow-up visit  Nutrition Interventions   Nutrition Discussed/Reviewed Nutrition Discussed  Pharmacy Interventions   Pharmacy Dicussed/Reviewed Pharmacy Topics Discussed      TOC Interventions Today    Flowsheet Row Most Recent Value  TOC Interventions   TOC Interventions Discussed/Reviewed TOC Interventions Discussed, Arranged PCP follow up less than 12 days/Care Guide scheduled  [Patient declines need for ongoing/ further care management/ coordination outreach,  declines enrollment in 30-day TOC program- declines taking my  direct phone number should needs/ concerns arise post-TOC call]      Total time spent from review to signing of note/ including any care coordination interventions:  29 minutes  Pls call/ message for questions,  Caryl Pina, RN, BSN, CCRN Alumnus RN Care Manager  Transitions of Care  [facility] [phone]: direct office

## 2024-02-11 NOTE — Patient Instructions (Incomplete)
 It was good to see you again-I will be in touch with your lab work

## 2024-02-11 NOTE — Progress Notes (Unsigned)
 [facility] [address], [address] [place], [place] [address] [phone]  Date:  02/15/2024   Name:  Chad Cox   DOB:  03/29/1951   MRN:  [id number]  PCP:  Pearline Cables, MD    Chief Complaint: No chief complaint on file.   History of Present Illness:  Chad Cox is a 73 y.o. very pleasant male patient who presents with the following:  Patient seen today for hospital follow-up Most recent visit with myself was earlier last month in March. History of diabetes, hypertension, dyslipidemia, diverticular bleed.  He has history of lower GI bleed in 2021 and 2023, he had recently [facility] Also when I saw him last month his A1c was quite high, I sent a message about starting a GLP-1 but he had not yet gotten back to me  He was then admitted later last month with a recurrent GI bleed Discharge hemoglobin 8.6, he got as low as 7.1 Admit date: 01/31/2024 Discharge date: 02/03/2024 Recommendations for Outpatient Follow-up:  Follow up with PCP in 1-2 weeks Follow-up with gastroenterology as an outpatient for elective colonoscopy  HPI: Per admitting MD, Chad Cox is a 73 y.o. male with medical history significant of poorly controlled type 2 diabetes mellitus, hypertension, dyslipidemia, class I obesity and history of iron deficiency anemia; who presented to the hospital secondary to noticing blood in his stools.  Patient reports 2 episodes of bloody stools since condition started; on his description the whole presentation similar to prior episodes of diverticular bleed.  Patient is hemodynamically stable and reports no chest pain, no shortness of breath, no nausea, no vomiting, no abdominal pain, no dysuria/hematuria, no dizziness/lightheadedness or any other complaints. [facility] has been consulted to place patient in the hospital for further evaluation and management; GI service ([facility]) has  been contacted.   Hospital Course / Discharge diagnoses: Principal Problem:   Lower GI bleed Active Problems:   Hypertension   Dyslipidemia   Iron deficiency anemia   Class 1 obesity   Poorly controlled diabetes mellitus (HCC)   Lower GI bleeding   Diverticulosis of colon with hemorrhage Principal problem Acute lower GI bleed -likely diverticular in nature.  Gastroenterology consulted and followed patient while hospitalized.  Patient had a second episode of significant GI bleed following admission associated with hypotension, lightheadedness, and was transferred to stepdown.  He underwent a stat CT angiogram at that time however did not show any source for his bleed.  Following that, clinically his bleeding appears to have stopped and hemoglobin has remained stable.  He was transfused a total of 1 unit of packed red blood cells.  With improvement, no further bleed, he will be discharged home in stable condition.  He will follow-up with gastroenterology as an outpatient and will have elective colonoscopy to evaluate further   Active problems Acute blood loss anemia -he was transfused 1 unit of packed red blood cells.  Hemoglobin stable  Essential hypertension-resume home medications on discharge Hyperlipidemia-continue statin Obesity, class I-BMI 30.8 Hypokalemia-replaced DM2-poorly controlled, with hyperglycemia.  Resume home medications Sepsis ruled out     Lab Results  Component Value Date   HGBA1C 10.1 (H) 01/26/2024     Patient Active Problem List   Diagnosis Date Noted   Lower GI bleeding 02/01/2024   Diverticulosis of colon with hemorrhage 02/01/2024   Occult blood in stools 01/31/2024   Lower GI bleed 01/31/2024  Class 1 obesity 01/31/2024   Poorly controlled diabetes mellitus (HCC) 01/31/2024   Iron deficiency anemia 02/06/2020   Diverticulitis of colon with bleeding 02/06/2020   Kidney pain 01/11/2019   Hematuria 01/11/2019   Bile leak, postoperative 09/26/2018    Gallstones and inflammation of gallbladder without obstruction 09/20/2018   Cholelithiasis and cholecystitis without obstruction 09/20/2018   Obesity, unspecified 04/29/2014   Diabetes mellitus (HCC) 01/30/2012   Hypertension 01/30/2012   Dyslipidemia 01/30/2012    Past Medical History:  Diagnosis Date   Bile leak, postoperative 09/26/2018   Cholecystitis    Diabetes mellitus without complication (HCC)    Diverticulitis of colon with bleeding 02/06/2020   Gallstones and inflammation of gallbladder without obstruction 09/20/2018   Hypertension    Iron deficiency anemia due to chronic blood loss 02/06/2020   Kidney stone     Past Surgical History:  Procedure Laterality Date   CHOLECYSTECTOMY N/A 09/20/2018   Procedure: LAPAROSCOPIC CHOLECYSTECTOMY WITH PRIMARY UMBILICAL HERNIA REPAIR;  Surgeon: Claud Kelp, MD;  Location: [facility];  Service: General;  Laterality: N/A;   ERCP N/A 09/21/2018   Procedure: ENDOSCOPIC RETROGRADE CHOLANGIOPANCREATOGRAPHY (ERCP);  Surgeon: Jeani Hawking, MD;  Location: [facility];  Service: Endoscopy;  Laterality: N/A;   LAPAROSCOPY N/A 09/21/2018   Procedure: LAPAROSCOPY DIAGNOSTIC, WASHOUT, DRAIN PLACEMENT;  Surgeon: Romie Levee, MD;  Location: [facility];  Service: General;  Laterality: N/A;   WISDOM TOOTH EXTRACTION      Social History   Tobacco Use   Smoking status: Never   Smokeless tobacco: Never  Substance Use Topics   Alcohol use: Yes    Comment: occ   Drug use: No    Family History  Problem Relation Age of Onset   Atrial fibrillation Mother     Allergies  Allergen Reactions   Adhesive [Tape] Itching and Other (See Comments)    "Acts like poison ivy" blisters up, itching   Betadine [Povidone Iodine] Itching and Other (See Comments)    Blisters, itching    Medication list has been reviewed and updated.  Current Outpatient Medications on File Prior to Visit  Medication Sig Dispense Refill   acetaminophen (TYLENOL) 650 MG  CR tablet Take 1,300 mg by mouth at bedtime.     Blood Glucose Monitoring Suppl DEVI 1 each by Does not apply route in the morning, at noon, and at bedtime. May substitute to any manufacturer covered by patient's insurance. 1 each 0   EXCEDRIN MIGRAINE 250-250-65 MG tablet Take 2 tablets by mouth every 6 (six) hours as needed for headache or migraine.     glipiZIDE (GLUCOTROL XL) 10 MG 24 hr tablet Take 1 tablet (10 mg total) by mouth daily with breakfast. 90 tablet 3   Glucose Blood (BLOOD GLUCOSE TEST STRIPS) STRP 1 each by In Vitro route in the morning, at noon, and at bedtime. May substitute to any manufacturer covered by patient's insurance. 100 strip 0   Lancet Device MISC 1 each by Does not apply route in the morning, at noon, and at bedtime. May substitute to any manufacturer covered by patient's insurance. 1 each 0   Lancets Misc. MISC 1 each by Does not apply route in the morning, at noon, and at bedtime. May substitute to any manufacturer covered by patient's insurance. 100 each 0   Lidocaine HCl-Benzyl Alcohol (SALONPAS LIDOCAINE PLUS EX) Apply 1-2 patches topically daily as needed (for pain).     losartan (COZAAR) 50 MG tablet Take 1 tablet (50 mg total) by mouth  daily. 90 tablet 3   metFORMIN (GLUCOPHAGE) 1000 MG tablet Take 1 tablet (1,000 mg total) by mouth 2 (two) times daily with a meal. (Patient taking differently: Take 1,000 mg by mouth in the morning and at bedtime.) 180 tablet 3   Multiple Vitamin (MULTIVITAMIN) tablet Take 1 tablet by mouth daily with breakfast.     pantoprazole (PROTONIX) 40 MG tablet TAKE 1 TABLET(40 MG) BY MOUTH DAILY (Patient taking differently: Take 40 mg by mouth at bedtime.) 90 tablet 3   pravastatin (PRAVACHOL) 80 MG tablet Take 1 tablet (80 mg total) by mouth daily. 90 tablet 3   No current facility-administered medications on file prior to visit.    Review of Systems:  As per HPI- otherwise negative.   Physical Examination: There were no vitals  filed for this visit. There were no vitals filed for this visit. There is no height or weight on file to calculate BMI. Ideal Body Weight:    GEN: no acute distress. HEENT: Atraumatic, Normocephalic.  Ears and Nose: No external deformity. CV: RRR, No M/G/R. No JVD. No thrill. No extra heart sounds. PULM: CTA B, no wheezes, crackles, rhonchi. No retractions. No resp. distress. No accessory muscle use. ABD: S, NT, ND, +BS. No rebound. No HSM. EXTR: No c/c/e PSYCH: Normally interactive. Conversant.    Assessment and Plan: ***  Signed Abbe Amsterdam, MD

## 2024-02-13 ENCOUNTER — Encounter: Payer: Self-pay | Admitting: Hematology & Oncology

## 2024-02-15 ENCOUNTER — Encounter: Payer: Self-pay | Admitting: Family Medicine

## 2024-02-15 ENCOUNTER — Ambulatory Visit (HOSPITAL_BASED_OUTPATIENT_CLINIC_OR_DEPARTMENT_OTHER)
Admission: RE | Admit: 2024-02-15 | Discharge: 2024-02-15 | Disposition: A | Discharge status: Home / Self Care | Source: Ambulatory Visit | Attending: Family Medicine | Admitting: Family Medicine

## 2024-02-15 ENCOUNTER — Ambulatory Visit: Admitting: Family Medicine

## 2024-02-15 ENCOUNTER — Telehealth: Payer: Self-pay

## 2024-02-15 VITALS — BP 140/80 | HR 74 | Temp 97.7°F | Resp 18 | Ht 72.0 in | Wt 227.4 lb

## 2024-02-15 DIAGNOSIS — E782 Mixed hyperlipidemia: Secondary | ICD-10-CM | POA: Diagnosis not present

## 2024-02-15 DIAGNOSIS — I499 Cardiac arrhythmia, unspecified: Secondary | ICD-10-CM | POA: Diagnosis not present

## 2024-02-15 DIAGNOSIS — M546 Pain in thoracic spine: Secondary | ICD-10-CM

## 2024-02-15 DIAGNOSIS — E119 Type 2 diabetes mellitus without complications: Secondary | ICD-10-CM

## 2024-02-15 DIAGNOSIS — Z7984 Long term (current) use of oral hypoglycemic drugs: Secondary | ICD-10-CM

## 2024-02-15 DIAGNOSIS — M542 Cervicalgia: Secondary | ICD-10-CM | POA: Diagnosis present

## 2024-02-15 DIAGNOSIS — I10 Essential (primary) hypertension: Secondary | ICD-10-CM

## 2024-02-15 DIAGNOSIS — Z8719 Personal history of other diseases of the digestive system: Secondary | ICD-10-CM | POA: Diagnosis not present

## 2024-02-15 LAB — CBC
HCT: 26.4 % — ABNORMAL LOW (ref 39.0–52.0)
Hemoglobin: 8.6 g/dL — ABNORMAL LOW (ref 13.0–17.0)
MCHC: 32.6 g/dL (ref 30.0–36.0)
MCV: 85 fl (ref 78.0–100.0)
Platelets: 354 10*3/uL (ref 150.0–400.0)
RBC: 3.1 Mil/uL — ABNORMAL LOW (ref 4.22–5.81)
RDW: 15.4 % (ref 11.5–15.5)
WBC: 5.3 10*3/uL (ref 4.0–10.5)

## 2024-02-15 LAB — BASIC METABOLIC PANEL WITH GFR
BUN: 10 mg/dL (ref 6–23)
CO2: 24 meq/L (ref 19–32)
Calcium: 8.7 mg/dL (ref 8.4–10.5)
Chloride: 105 meq/L (ref 96–112)
Creatinine, Ser: 0.93 mg/dL (ref 0.40–1.50)
GFR: 82.2 mL/min (ref >60.00)
Glucose, Bld: 318 mg/dL — ABNORMAL HIGH (ref 70–99)
Potassium: 4.1 meq/L (ref 3.5–5.1)
Sodium: 138 meq/L (ref 135–145)

## 2024-02-15 MED ORDER — RYBELSUS 3 MG PO TABS: 3.0000 mg | ORAL_TABLET | Freq: Every day | ORAL | 2 refills | Status: AC

## 2024-02-15 NOTE — Telephone Encounter (Signed)
-----   Message from Imogene Burn sent at 02/15/2024 11:48 AM EDT ----- Of course, we will get him in.   Pod B triage, let's get him in for a follow up appt in 1 month with me or Dr. Tomasa Rand or APP please. ----- Message ----- From: Pearline Cables, MD Sent: 02/15/2024   9:48 AM EDT To: Imogene Burn, MD  Hi Alan Ripper- I think Chad Cox is your GI patient- he was recently admitted with a GI bleed and needs follow-up if you would be so kind as to have your staff reach out to him.  He prefers Clinical cytogeneticist to phone   Thank you!

## 2024-02-15 NOTE — Telephone Encounter (Signed)
 Patient has been scheduled for a hospital follow up with Celso Amy, PA-C on Tuesday, 03/20/24 at 3 pm. MyChart message sent to patient with appointment information.

## 2024-02-16 NOTE — Telephone Encounter (Signed)
 Patient was rescheduled for earlier morning time as requested 03/21/24 at 8:20 am.

## 2024-02-22 ENCOUNTER — Encounter: Payer: Self-pay | Admitting: Family Medicine

## 2024-02-23 ENCOUNTER — Encounter: Payer: Self-pay | Admitting: Family Medicine

## 2024-02-27 ENCOUNTER — Encounter: Payer: Self-pay | Admitting: Family Medicine

## 2024-03-20 ENCOUNTER — Ambulatory Visit: Admitting: Physician Assistant

## 2024-03-20 NOTE — Progress Notes (Unsigned)
 Brigitte Canard, PA-C [address] [place], [place]  [address] Phone: [phone]   Primary Care Physician: Kaylee Partridge, MD  Primary Gastroenterologist:  Brigitte Canard, PA-C / Darol Elizabeth, MD   Chief Complaint:  Hospital follow-up lower GI bleed and anemia   HPI:   Chad Cox is a 73 y.o. male presents for hospital follow-up of lower GI bleed and anemia.  PMH: Poorly controlled type 2 diabetes mellitus, hypertension, dyslipidemia, class I obesity and history of iron  deficiency anemia.  Hospitalized 01/31/24 to 02/03/24 for bloody stools attributed to Diverticular bleed.  CT angiogram in hospital did not show any source for bleeding.  Bleeding stopped and hemoglobin remained stable.  Hemoglobin dropped to 7.1.  Transfused 1 unit PRBC.  Posttransfusion hemoglobin improved to 9.8.  Told to follow-up with GI outpatient for elective colonoscopy.  Is on pantoprazole  40 Mg once daily.  Since hospital discharge she has not had any more rectal bleeding.  Denies melena or hematochezia.  Has occasional mild intermittent constipation.  Typically has bowel movement daily.  Takes occasional stool softener as needed.  He is reluctant to take oral iron  because it causes constipation.  He has received IV iron  in the past.  He has had some intermittent nausea, vomiting, and upper abdominal pain.  He suspects this was due to taking Rybelsus  and he recently stopped that medicine.  01/07/2022 last colonoscopy done through [facility] (to evaluate rectal bleeding): Pandiverticulosis.  Blood throughout the colon.  No active bleeding.  No polyps or masses.  05/2012 colonoscopy by Dr. Tova Fresh: Internal hemorrhoids, pandiverticulosis, otherwise normal with no polyps.  Current Outpatient Medications  Medication Sig Dispense Refill   acetaminophen  (TYLENOL ) 650 MG CR tablet Take 1,300 mg by mouth at bedtime.     EXCEDRIN MIGRAINE 250-250-65 MG tablet Take 2 tablets by mouth  every 6 (six) hours as needed for headache or migraine.     glipiZIDE  (GLUCOTROL  XL) 10 MG 24 hr tablet Take 1 tablet (10 mg total) by mouth daily with breakfast. 90 tablet 3   Lidocaine  HCl-Benzyl Alcohol (SALONPAS LIDOCAINE  PLUS EX) Apply 1-2 patches topically daily as needed (for pain).     losartan  (COZAAR ) 50 MG tablet Take 1 tablet (50 mg total) by mouth daily. 90 tablet 3   metFORMIN  (GLUCOPHAGE ) 1000 MG tablet Take 1 tablet (1,000 mg total) by mouth 2 (two) times daily with a meal. (Patient taking differently: Take 1,000 mg by mouth in the morning and at bedtime.) 180 tablet 3   Multiple Vitamin (MULTIVITAMIN) tablet Take 1 tablet by mouth daily with breakfast.     pantoprazole  (PROTONIX ) 40 MG tablet TAKE 1 TABLET(40 MG) BY MOUTH DAILY (Patient taking differently: Take 40 mg by mouth at bedtime.) 90 tablet 3   pravastatin  (PRAVACHOL ) 80 MG tablet Take 1 tablet (80 mg total) by mouth daily. 90 tablet 3   Semaglutide  (RYBELSUS ) 3 MG TABS Take 1 tablet (3 mg total) by mouth daily. 30 tablet 2   No current facility-administered medications for this visit.    Allergies as of 03/21/2024 - Review Complete 03/21/2024  Allergen Reaction Noted   Adhesive [tape] Itching and Other (See Comments) 01/30/2012   Betadine [povidone iodine] Itching and Other (See Comments) 01/30/2012    Past Medical History:  Diagnosis Date   Bile leak, postoperative 09/26/2018   Cholecystitis    Diabetes mellitus without complication (HCC)    Diverticulitis of colon with bleeding 02/06/2020  Gallstones and inflammation of gallbladder without obstruction 09/20/2018   Hypertension    Iron  deficiency anemia due to chronic blood loss 02/06/2020   Kidney stone     Past Surgical History:  Procedure Laterality Date   CHOLECYSTECTOMY N/A 09/20/2018   Procedure: LAPAROSCOPIC CHOLECYSTECTOMY WITH PRIMARY UMBILICAL HERNIA REPAIR;  Surgeon: Boyce Byes, MD;  Location: [facility];  Service: General;  Laterality: N/A;    ERCP N/A 09/21/2018   Procedure: ENDOSCOPIC RETROGRADE CHOLANGIOPANCREATOGRAPHY (ERCP);  Surgeon: Alvis Jourdain, MD;  Location: [facility];  Service: Endoscopy;  Laterality: N/A;   LAPAROSCOPY N/A 09/21/2018   Procedure: LAPAROSCOPY DIAGNOSTIC, WASHOUT, DRAIN PLACEMENT;  Surgeon: Joyce Nixon, MD;  Location: [facility];  Service: General;  Laterality: N/A;   WISDOM TOOTH EXTRACTION      Review of Systems:    All systems reviewed and negative except where noted in HPI.    Physical Exam:  BP 134/64 (BP Location: Left Arm, Patient Position: Sitting, Cuff Size: Large)   Pulse 84   Ht 5\' 10"  (1.778 m) Comment: height measured without shoes  Wt 221 lb 6 oz (100.4 kg)   BMI 31.76 kg/m  No LMP for male patient.  General: Well-nourished, well-developed in no acute distress.  Lungs: Clear to auscultation bilaterally. Non-labored. Heart: Regular rate and rhythm, no murmurs rubs or gallops.  Abdomen: Bowel sounds are normal; Abdomen is Soft; No hepatosplenomegaly, masses or hernias;  No Abdominal Tenderness; No guarding or rebound tenderness. Neuro: Alert and oriented x 3.  Grossly intact.  Psych: Alert and cooperative, normal mood and affect.   Imaging Studies: No results found.  Labs: CBC    Component Value Date/Time   WBC 5.3 02/15/2024 1017   RBC 3.10 (L) 02/15/2024 1017   HGB 8.6 Repeated and verified X2. (L) 02/15/2024 1017   HGB 10.1 (L) 02/06/2020 1339   HCT 26.4 (L) 02/15/2024 1017   PLT 354.0 02/15/2024 1017   PLT 303 02/06/2020 1339   MCV 85.0 02/15/2024 1017   MCV 95.9 03/26/2017 1154   MCH 28.0 02/03/2024 0308   MCHC 32.6 02/15/2024 1017   RDW 15.4 02/15/2024 1017   LYMPHSABS 1.0 01/31/2024 0939   MONOABS 0.5 01/31/2024 0939   EOSABS 0.3 01/31/2024 0939   BASOSABS 0.1 01/31/2024 0939    CMP     Component Value Date/Time   NA 138 02/15/2024 1017   K 4.1 02/15/2024 1017   CL 105 02/15/2024 1017   CO2 24 02/15/2024 1017   GLUCOSE 318 (H) 02/15/2024 1017    BUN 10 02/15/2024 1017   CREATININE 0.93 02/15/2024 1017   CREATININE 1.06 02/06/2020 1339   CREATININE 0.86 03/10/2015 0911   CALCIUM 8.7 02/15/2024 1017   PROT 5.5 (L) 02/02/2024 0313   ALBUMIN 3.1 (L) 02/02/2024 0313   AST 12 (L) 02/02/2024 0313   AST 11 (L) 02/06/2020 1339   ALT 13 02/02/2024 0313   ALT 13 02/06/2020 1339   ALKPHOS 35 (L) 02/02/2024 0313   BILITOT 0.3 02/02/2024 0313   BILITOT 0.4 02/06/2020 1339   GFRNONAA >60 02/03/2024 0308   GFRNONAA >60 02/06/2020 1339   GFRAA >60 02/06/2020 1339       Assessment and Plan:   Jalani Chillis is a 73 y.o. y/o male presents for hospital follow-up of:  1.  Lower GI bleed; most consistent with diverticular bleed; no recent rectal bleeding. - Treat underlying constipation with MiraLAX.  2.  Acute blood loss anemia -Labs: CBC, iron  panel, ferritin, B12, folate, BMP -  Not currently on oral iron  or B12. - Cannot tolerate oral iron  which causes constipation. - If iron  is low, then refer for IV iron  infusion.  3.  Pandiverticulosis - Treat underlying constipation with MiraLAX.  4.  Colon cancer screening -Scheduling Colonoscopy I discussed risks of colonoscopy with patient to include risk of bleeding, colon perforation, and risk of sedation.  Patient expressed understanding and agrees to proceed with colonoscopy.   5.  GERD; Episodes of Belching, N/V on Rybelsus  -Continue Pantoprazole  40mg  daily - Scheduling EGD I discussed risks of EGD with patient to include risk of bleeding, perforation, and risk of sedation.  Patient expressed understanding and agrees to proceed with EGD.   Brigitte Canard, PA-C  Follow up 4 weeks after EGD and colonoscopy procedures.

## 2024-03-21 ENCOUNTER — Encounter: Payer: Self-pay | Admitting: Physician Assistant

## 2024-03-21 ENCOUNTER — Other Ambulatory Visit (INDEPENDENT_AMBULATORY_CARE_PROVIDER_SITE_OTHER)

## 2024-03-21 ENCOUNTER — Ambulatory Visit (INDEPENDENT_AMBULATORY_CARE_PROVIDER_SITE_OTHER): Admitting: Physician Assistant

## 2024-03-21 VITALS — BP 134/64 | HR 84 | Ht 70.0 in | Wt 221.4 lb

## 2024-03-21 DIAGNOSIS — K219 Gastro-esophageal reflux disease without esophagitis: Secondary | ICD-10-CM

## 2024-03-21 DIAGNOSIS — R112 Nausea with vomiting, unspecified: Secondary | ICD-10-CM | POA: Diagnosis not present

## 2024-03-21 DIAGNOSIS — R142 Eructation: Secondary | ICD-10-CM

## 2024-03-21 DIAGNOSIS — D649 Anemia, unspecified: Secondary | ICD-10-CM

## 2024-03-21 DIAGNOSIS — D62 Acute posthemorrhagic anemia: Secondary | ICD-10-CM

## 2024-03-21 DIAGNOSIS — Z8719 Personal history of other diseases of the digestive system: Secondary | ICD-10-CM

## 2024-03-21 DIAGNOSIS — K5900 Constipation, unspecified: Secondary | ICD-10-CM

## 2024-03-21 DIAGNOSIS — K5730 Diverticulosis of large intestine without perforation or abscess without bleeding: Secondary | ICD-10-CM

## 2024-03-21 LAB — CBC WITH DIFFERENTIAL/PLATELET
Basophils Absolute: 0 10*3/uL (ref 0.0–0.1)
Basophils Relative: 0.6 % (ref 0.0–3.0)
Eosinophils Absolute: 0.5 10*3/uL (ref 0.0–0.7)
Eosinophils Relative: 6.7 % — ABNORMAL HIGH (ref 0.0–5.0)
HCT: 29 % — ABNORMAL LOW (ref 39.0–52.0)
Hemoglobin: 9.4 g/dL — ABNORMAL LOW (ref 13.0–17.0)
Lymphocytes Relative: 12.7 % (ref 12.0–46.0)
Lymphs Abs: 0.9 10*3/uL (ref 0.7–4.0)
MCHC: 32.3 g/dL (ref 30.0–36.0)
MCV: 81.4 fl (ref 78.0–100.0)
Monocytes Absolute: 0.5 10*3/uL (ref 0.1–1.0)
Monocytes Relative: 7.4 % (ref 3.0–12.0)
Neutro Abs: 5.2 10*3/uL (ref 1.4–7.7)
Neutrophils Relative %: 72.6 % (ref 43.0–77.0)
Platelets: 327 10*3/uL (ref 150.0–400.0)
RBC: 3.56 Mil/uL — ABNORMAL LOW (ref 4.22–5.81)
RDW: 15.8 % — ABNORMAL HIGH (ref 11.5–15.5)
WBC: 7.2 10*3/uL (ref 4.0–10.5)

## 2024-03-21 LAB — IRON,TIBC AND FERRITIN PANEL
%SAT: 6 % — ABNORMAL LOW (ref 20–48)
Ferritin: 4 ng/mL — ABNORMAL LOW (ref 24–380)
Iron: 24 ug/dL — ABNORMAL LOW (ref 50–180)
TIBC: 422 ug/dL (ref 250–425)

## 2024-03-21 LAB — BASIC METABOLIC PANEL WITH GFR
BUN: 12 mg/dL (ref 6–23)
CO2: 25 meq/L (ref 19–32)
Calcium: 9.1 mg/dL (ref 8.4–10.5)
Chloride: 104 meq/L (ref 96–112)
Creatinine, Ser: 1.13 mg/dL (ref 0.40–1.50)
GFR: 65.03 mL/min (ref >60.00)
Glucose, Bld: 274 mg/dL — ABNORMAL HIGH (ref 70–99)
Potassium: 4.1 meq/L (ref 3.5–5.1)
Sodium: 137 meq/L (ref 135–145)

## 2024-03-21 LAB — B12 AND FOLATE PANEL
Folate: 21.9 ng/mL (ref >5.9)
Vitamin B-12: 248 pg/mL (ref 211–911)

## 2024-03-21 NOTE — Patient Instructions (Addendum)
 Your provider has requested that you go to the basement level for lab work before leaving today. Press "B" on the elevator. The lab is located at the first door on the left as you exit the elevator.  Please take 1 TBSP of Miralax in a drink every day to prevent constipation.   You can adjust dose of Miralax based on bowel habits. Drink 64 ounces of water / fluids daily.  We will give you a call to schedule you Colonoscopy and Endoscopy  ___________________________________________________________________________  Please follow up sooner if symptoms increase or worsen __________________________________________________________________________  Due to recent changes in healthcare laws, you may see the results of your imaging and laboratory studies on MyChart before your provider has had a chance to review them.  We understand that in some cases there may be results that are confusing or concerning to you. Not all laboratory results come back in the same time frame and the provider may be waiting for multiple results in order to interpret others.  Please give us  48 hours in order for your provider to thoroughly review all the results before contacting the office for clarification of your results.   _______________________________________________________  If your blood pressure at your visit was 140/90 or greater, please contact your primary care physician to follow up on this.  _______________________________________________________  If you are age 63 or older, your body mass index should be between 23-30. Your Body mass index is 31.76 kg/m. If this is out of the aforementioned range listed, please consider follow up with your Primary Care Provider.  If you are age 69 or younger, your body mass index should be between 19-25. Your Body mass index is 31.76 kg/m. If this is out of the aformentioned range listed, please consider follow up with your Primary Care Provider.    ________________________________________________________  The [facility] providers would like to encourage you to use MYCHART to communicate with providers for non-urgent requests or questions.  Due to long hold times on the telephone, sending your provider a message by [facility] may be a faster and more efficient way to get a response.  Please allow 48 business hours for a response.  Please remember that this is for non-urgent requests.  _______________________________________________________ Thank you for trusting me with your gastrointestinal care!   Brigitte Canard, PA

## 2024-03-22 ENCOUNTER — Ambulatory Visit: Payer: Self-pay | Admitting: Physician Assistant

## 2024-03-22 DIAGNOSIS — D649 Anemia, unspecified: Secondary | ICD-10-CM

## 2024-03-22 NOTE — Progress Notes (Signed)
 Call and notify patient his labs show: 1.  His hemoglobin and iron  are still very low.  **Please refer to hematology for IV iron  infusion. 2.  Vitamin B12 is at the low end of normal.  Please start OTC vitamin B12 - 1000 mg daily. 3.  Blood sugar is very elevated 274.  Please follow-up with PCP and endocrinologist to get blood sugars under better control. 4.  Kidney test is normal. Brigitte Canard, PA-C

## 2024-03-23 ENCOUNTER — Other Ambulatory Visit: Payer: Self-pay

## 2024-03-23 DIAGNOSIS — D649 Anemia, unspecified: Secondary | ICD-10-CM

## 2024-04-05 ENCOUNTER — Telehealth: Payer: Self-pay | Admitting: *Deleted

## 2024-04-05 NOTE — Telephone Encounter (Signed)
-----   Message ----- From: Brigitte Canard, PA-C Sent: 04/05/2024  11:18 AM EDT To: [facility] Triage Subject: FW: Closed referral / Iron  Treatment / IDA      Patient has moderate Iron  Deficiency Anemia.  He refused Hematology Evaluation for IV Iron .  I recommend prescribe an Iron  Pill: Integra Iron  1 tablet once daily, #30, 3 Refills.  Recommend repeat CBC, Iron  panal and Ferritin lab in 6 - 8 weeks, Dx IDA. Brigitte Canard, PA-C ----- Message ----- From: Efrain Grant Sent: 04/04/2024   4:15 PM EDT To: Brigitte Canard, PA-C Subject: Closed referral                                 Hi Brian Campanile,   My name is Hospital doctor and I am the referral coordinator for [facility]. I'm just letting you know that I have closed the referral at the patients request. Per the patient he believes we are "just making money off him by having him see multiple doctors."   Sincerely, [facility] Scheduler

## 2024-04-05 NOTE — Telephone Encounter (Signed)
 Left message for patient to call back

## 2024-04-06 NOTE — Telephone Encounter (Signed)
 Left message for patient to call back

## 2024-04-06 NOTE — Progress Notes (Signed)
 Agree with the assessment and plan as outlined by Brigitte Canard, PA-C.  High fiber diet or daily fiber supplement is recommended to reduce risk of diverticular complications.  Although patient had colonoscopy in 2023, visualization for colon polyp detection was not adequate due to presence of blood and he was recommended to repeat in 6 months.

## 2024-04-09 NOTE — Telephone Encounter (Signed)
 Left message for patient to call back. Will also send a mychart message.

## 2024-08-09 ENCOUNTER — Other Ambulatory Visit: Payer: Self-pay | Admitting: Family Medicine

## 2024-08-09 DIAGNOSIS — E782 Mixed hyperlipidemia: Secondary | ICD-10-CM

## 2024-09-10 ENCOUNTER — Encounter: Payer: Self-pay | Admitting: Family Medicine

## 2024-09-10 LAB — HM DIABETES EYE EXAM
